# Patient Record
Sex: Male | Born: 1985 | Race: White | Hispanic: No | Marital: Single | Smoking: Current every day smoker
Health system: Southern US, Community
[De-identification: ages and names within clinical notes are randomized; demographics above are authoritative.]

## PROBLEM LIST (undated history)

## (undated) DIAGNOSIS — F419 Anxiety disorder, unspecified: Secondary | ICD-10-CM

## (undated) DIAGNOSIS — B1920 Unspecified viral hepatitis C without hepatic coma: Secondary | ICD-10-CM

## (undated) DIAGNOSIS — J45909 Unspecified asthma, uncomplicated: Secondary | ICD-10-CM

## (undated) DIAGNOSIS — F410 Panic disorder [episodic paroxysmal anxiety] without agoraphobia: Secondary | ICD-10-CM

## (undated) HISTORY — DX: Unspecified viral hepatitis C without hepatic coma: B19.20

---
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
## 2009-02-12 ENCOUNTER — Emergency Department (HOSPITAL_COMMUNITY): Admission: EM | Admit: 2009-02-12 | Discharge: 2009-02-12 | Payer: Self-pay | Admitting: Emergency Medicine

## 2009-02-18 ENCOUNTER — Emergency Department (HOSPITAL_COMMUNITY): Admission: EM | Admit: 2009-02-18 | Discharge: 2009-02-18 | Payer: Self-pay | Admitting: Emergency Medicine

## 2010-05-16 ENCOUNTER — Emergency Department (HOSPITAL_COMMUNITY): Admission: EM | Admit: 2010-05-16 | Discharge: 2010-05-16 | Payer: Self-pay | Admitting: Family Medicine

## 2011-02-21 LAB — COMPREHENSIVE METABOLIC PANEL
BUN: 14 mg/dL (ref 6–23)
CO2: 27 mEq/L (ref 19–32)
Calcium: 9 mg/dL (ref 8.4–10.5)
Chloride: 105 mEq/L (ref 96–112)
Creatinine, Ser: 0.86 mg/dL (ref 0.4–1.5)
GFR calc Af Amer: >60 mL/min (ref >60)
GFR calc non Af Amer: >60 mL/min (ref >60)
Glucose, Bld: 100 mg/dL — ABNORMAL HIGH (ref 70–99)
Total Bilirubin: 0.6 mg/dL (ref 0.3–1.2)

## 2011-02-21 LAB — DIFFERENTIAL
Basophils Absolute: 0 10*3/uL (ref 0.0–0.1)
Eosinophils Relative: 1 % (ref 0–5)
Lymphocytes Relative: 31 % (ref 12–46)
Lymphs Abs: 2.4 10*3/uL (ref 0.7–4.0)
Neutro Abs: 4.6 10*3/uL (ref 1.7–7.7)
Neutrophils Relative %: 59 % (ref 43–77)

## 2011-02-21 LAB — CBC
HCT: 40.2 % (ref 39.0–52.0)
MCHC: 35.3 g/dL (ref 30.0–36.0)
MCV: 94.1 fL (ref 78.0–100.0)
RBC: 4.27 MIL/uL (ref 4.22–5.81)
WBC: 7.8 10*3/uL (ref 4.0–10.5)

## 2011-02-21 LAB — CSF CELL COUNT WITH DIFFERENTIAL: WBC, CSF: 1 /mm3 (ref 0–5)

## 2011-02-21 LAB — CSF CULTURE W GRAM STAIN

## 2011-02-21 LAB — POCT I-STAT, CHEM 8
BUN: 16 mg/dL (ref 6–23)
Calcium, Ion: 1.14 mmol/L (ref 1.12–1.32)
Chloride: 102 mEq/L (ref 96–112)
Creatinine, Ser: 1.3 mg/dL (ref 0.4–1.5)
Glucose, Bld: 143 mg/dL — ABNORMAL HIGH (ref 70–99)
HCT: 46 % (ref 39.0–52.0)
Hemoglobin: 15.6 g/dL (ref 13.0–17.0)
Potassium: 3.2 mEq/L — ABNORMAL LOW (ref 3.5–5.1)
Sodium: 139 mEq/L (ref 135–145)
TCO2: 25 mmol/L (ref 0–100)

## 2011-02-21 LAB — LIPASE, BLOOD: Lipase: 17 U/L (ref 11–59)

## 2011-02-21 LAB — HEMOCCULT GUIAC POC 1CARD (OFFICE): Fecal Occult Bld: POSITIVE

## 2011-02-21 LAB — PROTEIN, CSF: Total  Protein, CSF: 36 mg/dL (ref 15–45)

## 2011-02-21 LAB — GLUCOSE, CSF: Glucose, CSF: 78 mg/dL — ABNORMAL HIGH (ref 43–76)

## 2011-02-21 LAB — POCT CARDIAC MARKERS: Myoglobin, poc: 46.3 ng/mL (ref 12–200)

## 2011-03-30 NOTE — Discharge Summary (Signed)
[place]  Patient:    Jeffrey Holland, Jeffrey Holland                   MRN: [id number] Adm. Date:  [id number] Disc. Date: [id number] Attending:  Asher Muir Dictator:   Dalbert Mayotte, M.D.                           Discharge Summary  DATE OF BIRTH:  [birth year]  DISCHARGE DIAGNOSIS:  Aseptic meningitis.  DISCHARGE MEDICATIONS:  Doxycycline 100 mg p.o. b.i.d. x 3 days.  CONSULTS:  Infectious disease, Rockey Situ. Flavia Shipper., M.D.  PROCEDURES:  Lumbar puncture.  HISTORY OF PRESENT ILLNESS:  The patient is a 25 year old white male admitted with a five-day history of fever, headache, nausea, and vomiting.  He was started on doxycycline after being in the emergency room on May 04, 2001, secondary to a history of tick exposure, but had no fever at that time with a white count of 4.7.  On admission, the patient had continued headache and had some questionable neurologic status changes with some confusion and altered mental status per familys report.  LABORATORY DATA ON ADMISSION:  White count 5.1, hemoglobin 14.4, platelet count 135, ANC 3.1, ALC 1.5.  The LP had an opening pressure of 19, a glucose of 56, a total protein of 151, a white count of 143, red blood cell count 4 with 80% segs, 60% lymphs, and 31% monos.  HSV cultures are pending along with an arbovirus panel and CSF pending as well.  HOSPITAL COURSE: #1 - HEADACHE AND FEVER:  The patient was originally felt to be at risk for bacteria versus viral meningitis and was started on ceftriaxone.  Doxycycline was continued to cover for [facility] spotted fever along with acyclovir to cover for possible HSV encephalitis, although this was felt unlikely secondary to the ______ of rbcs in the CHF.  On the morning after admission, the patient had much improvement in his symptoms, stating that he felt all better and requesting discharge home.  His headache had resolved.  His myalgias had resolved.  His  conversation seemed more appropriate to his family, although he did still have some difficulty in stating the date, although he stated that he did not keep up with those types of things.  Also, I failed to mentioned that urine drug screen was positive for marijuana on admission and he readily admitted to daily use, though his family said this was almost baseline for him.  Secondary to his presentation and persistent fevers, an infectious disease consult was obtained.  Rockey Situ. Flavia Shipper., M.D., came and evaluated the patient and felt that his symptoms were most likely consistent with an aseptic meningitis-type picture.  He recommended sending off the CSF for other specific etiologies, such as enterovirus, West Nile, HSV PCR, and arbovirus studies.  All of these are pending at the time of this dictation.  He was continued to be observed for 48 hours on the ceftriaxone and an MRI was obtained to evaluate for possible encephalitis, but there was no evidence of this on the MRI, which was negative.  After getting these results, infectious disease felt that the patient could discontinue the acyclovir and ceftriaxone, which was done.  The patient was observed overnight and discharged first thing in the morning on May 09, 2001.  He remained stable with no headache, nausea, or vomiting.  He was afebrile and back to his baseline.  DISPOSITION:  The patient was discharged to home.  DISCHARGE INSTRUCTIONS:  The patient was given a regular diet.  FOLLOW-UP:  He was not arranged for follow-up, but was going to follow up with Loran Senters, M.D., in [place], [place], on an as needed basis.  We will follow up on the above results of the cultures mentioned above. DD:  05/09/01 TD:  05/09/01 Job: [id number]

## 2011-03-30 NOTE — Op Note (Signed)
[place]. [facility]  Patient:    Jeffrey, Holland Visit Number: [id number] MRN: [id number]          Service Type: DSU Location: [facility] Attending Physician:  Susa Day Dictated by:   Katy Fitch Naaman Plummer., M.D. Proc. Date: 08/01/01 Admit Date:  08/01/2001                             Operative Report  PREOPERATIVE DIAGNOSIS:  Status post laceration dorsal aspect, right long finger, overlying metacarpophalangeal joint, with extensor lag of right long finger at interphalangeal joint.  POSTOPERATIVE DIAGNOSIS:  Status post laceration dorsal aspect, right long finger, overlying metacarpophalangeal joint, with extensor lag of right long finger at interphalangeal joint, with identification of complete laceration of extensor mechanism through common digital extensor at digital margin of metacarpophalangeal joint and laceration of radial and ulnar sagittal bands.  PROCEDURE: 1. Repair of extensor digitorum longus, right long finger. 2. Repair of radial and ulnar sagittal bands, right long finger.  SURGEON:  Katy Fitch. Sypher, Montez Hageman., M.D.  ASSISTANT:  Jonni Sanger, P.A.  ANESTHESIA:  0.25% Marcaine and 2% lidocaine supplemented by IV sedation supervised by the anesthesiologist, Burna Forts, M.D.  INDICATIONS:  Jeffrey Holland is an 25 year old who was using a knife at home the day prior to surgery, sustaining a deep laceration to the dorsal aspect of his right long finger metacarpophalangeal joint.  He was seen early in the morning of August 01, 2001, at the [facility] day surgery center by Dr. Doug Sou, who noted an extensor tendon laceration.  Dr. Ethelda Chick irrigated the wound and closed the skin and called for a hand surgery consult.  Given the fact that Jeffrey Holland had not been NPO for a period of time, we advised him to wait until approximately 9 a.m. the morning of August 01, 2001, for delayed primary repair of  his tendon at the orthopedic surgical center.  Jeffrey Holland was transferred from the [facility] emergency room to the day surgery facility by the [facility] transportation facility of [facility].  After informed consent, he is brought to the operating room at this time for primary repair of his tendon.  DESCRIPTION OF PROCEDURE:  Jeffrey Holland was brought to the operating room and placed in the supine position on the operating table.  Following light sedation, the right arm was prepped with Betadine soap and solution and sterilely draped.  Following exsanguination of the limb with an Esmarch bandage, the arterial tourniquet was inflated to 220 mmHg.  The procedure commenced with infiltration of 0.25% Marcaine and 2% lidocaine into the MP joint and surrounding tissues, including the digital nerves, to obtain a digital block. When anesthesia was satisfactory, the sutures from Dr. Joesphine Bare repair were removed and the wound thoroughly irrigated with sterile saline.  The joint was flexed and the capsule identified.  This was irrigated thoroughly.  The extensor tendon was then repaired anatomically with a core suture of 4-0 Mersilene using grasping technique, and then repair of the sagittal fiber lacerations with mattress sutures of 4-0 Mersilene with the knots buried.  The skin was then repaired with intradermal 3-0 Prolene and Steri-Strips.  A compressive dressing was applied with a volar plaster splint, maintaining the MP joint in 30 degrees of flexion and the IP joints in full extension. There were no apparent complications.  For aftercare, Jeffrey Holland is given prescriptions for Percocet 5  mg one or two tablets p.o. q.4-6h. p.r.n. pain, 30 tablets without refill; also Keflex 500 mg one p.o. q.8h. x 4 days as a prophylactic antibiotic.  He was also given a gram of Ancef intraoperatively. Dictated by:   Katy Fitch Naaman Plummer., M.D. Attending Physician:  Susa Day DD:  08/01/01 TD:  08/01/01 Job: [id number]

## 2014-06-23 ENCOUNTER — Emergency Department (HOSPITAL_COMMUNITY): Payer: Self-pay

## 2014-06-23 ENCOUNTER — Emergency Department (HOSPITAL_COMMUNITY)
Admission: EM | Admit: 2014-06-23 | Discharge: 2014-06-23 | Disposition: A | Discharge status: Home / Self Care | Payer: Self-pay | Attending: Emergency Medicine | Admitting: Emergency Medicine

## 2014-06-23 ENCOUNTER — Encounter (HOSPITAL_COMMUNITY): Payer: Self-pay | Admitting: Emergency Medicine

## 2014-06-23 DIAGNOSIS — R079 Chest pain, unspecified: Secondary | ICD-10-CM | POA: Insufficient documentation

## 2014-06-23 DIAGNOSIS — Z8719 Personal history of other diseases of the digestive system: Secondary | ICD-10-CM | POA: Insufficient documentation

## 2014-06-23 DIAGNOSIS — R110 Nausea: Secondary | ICD-10-CM | POA: Insufficient documentation

## 2014-06-23 DIAGNOSIS — R1013 Epigastric pain: Secondary | ICD-10-CM | POA: Insufficient documentation

## 2014-06-23 DIAGNOSIS — F17200 Nicotine dependence, unspecified, uncomplicated: Secondary | ICD-10-CM | POA: Insufficient documentation

## 2014-06-23 LAB — HEPATIC FUNCTION PANEL
ALT: 13 U/L (ref 0–53)
AST: 19 U/L (ref 0–37)
Albumin: 4.3 g/dL (ref 3.5–5.2)
Alkaline Phosphatase: 61 U/L (ref 39–117)
Bilirubin, Direct: <0.2 mg/dL (ref 0.0–0.3)
Total Bilirubin: 0.6 mg/dL (ref 0.3–1.2)
Total Protein: 7.7 g/dL (ref 6.0–8.3)

## 2014-06-23 LAB — I-STAT TROPONIN, ED: Troponin i, poc: 0 ng/mL (ref 0.00–0.08)

## 2014-06-23 LAB — BASIC METABOLIC PANEL
Anion gap: 11 (ref 5–15)
BUN: 10 mg/dL (ref 6–23)
CALCIUM: 9.2 mg/dL (ref 8.4–10.5)
CO2: 25 meq/L (ref 19–32)
CREATININE: 0.83 mg/dL (ref 0.50–1.35)
Chloride: 104 mEq/L (ref 96–112)
GFR calc Af Amer: >90 mL/min (ref >90)
GFR calc non Af Amer: >90 mL/min (ref >90)
GLUCOSE: 94 mg/dL (ref 70–99)
Potassium: 4.1 mEq/L (ref 3.7–5.3)
Sodium: 140 mEq/L (ref 137–147)

## 2014-06-23 LAB — CBC
HEMATOCRIT: 43.9 % (ref 39.0–52.0)
HEMOGLOBIN: 15 g/dL (ref 13.0–17.0)
MCH: 33 pg (ref 26.0–34.0)
MCHC: 34.2 g/dL (ref 30.0–36.0)
MCV: 96.5 fL (ref 78.0–100.0)
Platelets: 250 10*3/uL (ref 150–400)
RBC: 4.55 MIL/uL (ref 4.22–5.81)
RDW: 12.9 % (ref 11.5–15.5)
WBC: 8.5 10*3/uL (ref 4.0–10.5)

## 2014-06-23 LAB — LIPASE, BLOOD: Lipase: 37 U/L (ref 11–59)

## 2014-06-23 MED ORDER — FAMOTIDINE 20 MG PO TABS: 20.0000 mg | ORAL_TABLET | Freq: Two times a day (BID) | ORAL | Status: DC

## 2014-06-23 MED ORDER — GI COCKTAIL ~~LOC~~
30.0000 mL | Freq: Once | ORAL | Status: AC
  Administered 2014-06-23: 30 mL via ORAL
  Filled 2014-06-23: qty 30

## 2014-06-23 NOTE — ED Provider Notes (Signed)
CSN: [id number]     Arrival date & time 06/23/14  1300 History   First MD Initiated Contact with Patient 06/23/14 1604     Chief Complaint  Patient presents with  . Chest Pain     (Consider location/radiation/quality/duration/timing/severity/associated sxs/prior Treatment) HPI Pt is a 28yo male presenting to ED with c/o gradually worsening intermittent chest pain x2 weeks.  Chest pain is centralized and also present in epigastric region of abdomen.  Pain is an aching and sharp sensation, 10/10 at worst.  Nothing makes better or worse.  Pt states he has had similar pain x10 years but states he never got it checked out.  Father is present with pt and states he thinks pt has a hiatal hernia as pt has similar symptoms as he did and states "it took them a while to figure out what was causing my pain, they almost did a heart cath before they found it."  Pt has been taking zantac w/o relief.  Pt has also tried father prescription medication w/o relief. Reports nausea but denies vomiting. Denies cough, congestion, SOB, fever.  States pain comes and goes, nothing makes better or worse. No hx of CAD or abdominal surgeries. Pt is a daily smoker, 2ppd.      History reviewed. No pertinent past medical history. History reviewed. No pertinent past surgical history. No family history on file. History  Substance Use Topics  . Smoking status: Current Every Day Smoker -- 2.00 packs/day    Types: Cigarettes  . Smokeless tobacco: Not on file  . Alcohol Use: No    Review of Systems  Constitutional: Negative for fever, chills, diaphoresis and appetite change.  HENT: Negative for congestion and sore throat.   Respiratory: Negative for cough, chest tightness and shortness of breath.   Cardiovascular: Positive for chest pain. Negative for palpitations and leg swelling.  Gastrointestinal: Positive for nausea and abdominal pain. Negative for vomiting, diarrhea and constipation.  All other systems reviewed and are  negative.     Allergies  Review of patient's allergies indicates no known allergies.  Home Medications   Prior to Admission medications   Medication Sig Start Date End Date Taking? Authorizing Provider  famotidine (PEPCID) 20 MG tablet Take 1 tablet (20 mg total) by mouth 2 (two) times daily. 06/23/14   Junius Finner, PA-C   BP 102/67  Pulse 68  Temp(Src) 98.2 F (36.8 C) (Oral)  Resp 17  SpO2 99% Physical Exam  Nursing note and vitals reviewed. Constitutional: He appears well-developed and well-nourished.  Pt lying comfortably in exam bed, NAD.   HENT:  Head: Normocephalic and atraumatic.  Eyes: Conjunctivae are normal. No scleral icterus.  Neck: Normal range of motion.  Cardiovascular: Normal rate, regular rhythm and normal heart sounds.   Regular rate and rhythm  Pulmonary/Chest: Effort normal and breath sounds normal. No respiratory distress. He has no wheezes. He has no rales. He exhibits no tenderness.  No respiratory distress, able to speak in full sentences w/o difficulty. Lungs: CTAB. No chest wall tenderness.  Abdominal: Soft. Bowel sounds are normal. He exhibits no distension and no mass. There is tenderness. There is no rebound and no guarding.  Soft, non-distended. Normal bowel sounds. Tenderness in upper abdomen, worse in epigastrium. No rebound, guarding or masses.  Musculoskeletal: Normal range of motion.  Neurological: He is alert.  Skin: Skin is warm and dry.    ED Course  Procedures (including critical care time) Labs Review Labs Reviewed  BASIC METABOLIC PANEL  CBC  HEPATIC FUNCTION PANEL  LIPASE, BLOOD  I-STAT TROPOININ, ED    Imaging Review Dg Chest 2 View  06/23/2014   CLINICAL DATA:  Mid chest and abdominal pain for 3 weeks with similar pain intermittently for 1 year. Shortness of breath today  EXAM: CHEST  2 VIEW  COMPARISON:  PA and lateral chest of February 18, 2009  FINDINGS: The lungs are mildly hyperinflated with hemidiaphragm flattening.  The heart and pulmonary vascularity are normal. The mediastinum is normal in width. There is no pleural effusion or pneumothorax. The bony thorax is unremarkable.  IMPRESSION: Hyperinflation consistent with COPD or reactive airway disease. There is no pneumonia nor CHF nor other acute cardiopulmonary abnormality.   Electronically Signed   By: David  SwazilandJordan   On: 06/23/2014 13:37     EKG Interpretation None      MDM   Final diagnoses:  Epigastric pain  Chest pain, unspecified chest pain type    Pt is a 28yo male c/o epigastric pain radiating into chest x2 weeks but reports hx of similar symptoms x10 years. Denies cardiac hx. Chief complaint states "chest pain" but pt keeps indicating epigastric pain. Pt also tender in RUQ.  Pt appears well, non-toxic, afebrile. Labs: unremarkable. Pain is atypical for ACS. PERC negative. GI cocktail given w/o relief. Abd U/S of gallbladder: unremarkable. Discussed findings with pt, advised to f/u with GI for further evaluation. Pt may have gastric and/or duodenal ulcer(s) may benefit from endoscopy for chronic symptoms. Ad[facility]. Discussed pt with Dr. Romeo AppleHarrison who also examined pt, agrees pt may be discharged home. Return precautions provided. Pt verbalized understanding and agreement with tx plan.     Junius Finnerrin O'Malley, PA-C 06/23/14 1904

## 2014-06-23 NOTE — ED Notes (Signed)
Lab called to add on blood work 

## 2014-06-23 NOTE — ED Notes (Signed)
Patient states he has been having intermittent chest pain x 2 weeks.  Patient states that the pain is central chest with radiation to abdominal area.   Patient states N/V for 2 days, but denies other symptoms.   Patient states "i have really had chest pain x 10 years, but never got it checked out".

## 2014-06-23 NOTE — ED Notes (Signed)
Patient transported to Ultrasound 

## 2014-06-23 NOTE — Progress Notes (Signed)
[facility] Community Liaison Stacy, ° °Will be sending information on [facility] orange Card program, using the address provided.  °

## 2014-06-24 NOTE — ED Provider Notes (Signed)
Medical screening examination/treatment/procedure(s) were conducted as a shared visit with non-physician practitioner(s) and myself.  I personally evaluated the patient during the encounter.   EKG Interpretation None      I interviewed and examined the patient. Lungs are CTAB. Cardiac exam wnl. Abdomen soft w/ mild ttp of epig area. Appears to be epig pain rather than cp. Workup non-contrib. Plan for d/c home.   Purvis SheffieldForrest Zarius Furr, MD 06/24/14 [phone]

## 2015-12-24 ENCOUNTER — Encounter (HOSPITAL_COMMUNITY): Payer: Self-pay | Admitting: *Deleted

## 2015-12-24 ENCOUNTER — Emergency Department (HOSPITAL_COMMUNITY)
Admission: EM | Admit: 2015-12-24 | Discharge: 2015-12-24 | Disposition: A | Discharge status: Home / Self Care | Payer: Self-pay | Attending: Emergency Medicine | Admitting: Emergency Medicine

## 2015-12-24 ENCOUNTER — Emergency Department (HOSPITAL_COMMUNITY): Payer: Self-pay

## 2015-12-24 DIAGNOSIS — F17200 Nicotine dependence, unspecified, uncomplicated: Secondary | ICD-10-CM | POA: Insufficient documentation

## 2015-12-24 DIAGNOSIS — R200 Anesthesia of skin: Secondary | ICD-10-CM | POA: Insufficient documentation

## 2015-12-24 DIAGNOSIS — Z7289 Other problems related to lifestyle: Secondary | ICD-10-CM

## 2015-12-24 DIAGNOSIS — F1210 Cannabis abuse, uncomplicated: Secondary | ICD-10-CM | POA: Insufficient documentation

## 2015-12-24 DIAGNOSIS — F1099 Alcohol use, unspecified with unspecified alcohol-induced disorder: Secondary | ICD-10-CM | POA: Insufficient documentation

## 2015-12-24 DIAGNOSIS — R0602 Shortness of breath: Secondary | ICD-10-CM | POA: Insufficient documentation

## 2015-12-24 DIAGNOSIS — R251 Tremor, unspecified: Secondary | ICD-10-CM | POA: Insufficient documentation

## 2015-12-24 DIAGNOSIS — Z789 Other specified health status: Secondary | ICD-10-CM

## 2015-12-24 LAB — CBC WITH DIFFERENTIAL/PLATELET
BASOS PCT: 0 %
Basophils Absolute: 0 10*3/uL (ref 0.0–0.1)
EOS ABS: 0.3 10*3/uL (ref 0.0–0.7)
EOS PCT: 2 %
HCT: 45.6 % (ref 39.0–52.0)
HEMOGLOBIN: 15.1 g/dL (ref 13.0–17.0)
Lymphocytes Relative: 42 %
Lymphs Abs: 5.4 10*3/uL — ABNORMAL HIGH (ref 0.7–4.0)
MCH: 33 pg (ref 26.0–34.0)
MCHC: 33.1 g/dL (ref 30.0–36.0)
MCV: 99.6 fL (ref 78.0–100.0)
MONOS PCT: 6 %
Monocytes Absolute: 0.8 10*3/uL (ref 0.1–1.0)
NEUTROS PCT: 50 %
Neutro Abs: 6.2 10*3/uL (ref 1.7–7.7)
PLATELETS: 275 10*3/uL (ref 150–400)
RBC: 4.58 MIL/uL (ref 4.22–5.81)
RDW: 13.9 % (ref 11.5–15.5)
WBC: 12.8 10*3/uL — AB (ref 4.0–10.5)

## 2015-12-24 LAB — COMPREHENSIVE METABOLIC PANEL
ALT: 19 U/L (ref 17–63)
AST: 21 U/L (ref 15–41)
Albumin: 4.6 g/dL (ref 3.5–5.0)
Alkaline Phosphatase: 60 U/L (ref 38–126)
Anion gap: 10 (ref 5–15)
BUN: 10 mg/dL (ref 6–20)
CO2: 25 mmol/L (ref 22–32)
Calcium: 8.7 mg/dL — ABNORMAL LOW (ref 8.9–10.3)
Chloride: 106 mmol/L (ref 101–111)
Creatinine, Ser: 0.93 mg/dL (ref 0.61–1.24)
GFR calc Af Amer: >60 mL/min (ref >60)
GFR calc non Af Amer: >60 mL/min (ref >60)
Glucose, Bld: 100 mg/dL — ABNORMAL HIGH (ref 65–99)
Potassium: 3.5 mmol/L (ref 3.5–5.1)
Sodium: 141 mmol/L (ref 135–145)
Total Bilirubin: 0.5 mg/dL (ref 0.3–1.2)
Total Protein: 7.6 g/dL (ref 6.5–8.1)

## 2015-12-24 LAB — RAPID URINE DRUG SCREEN, HOSP PERFORMED
Amphetamines: NOT DETECTED
Barbiturates: NOT DETECTED
Benzodiazepines: NOT DETECTED
COCAINE: NOT DETECTED
OPIATES: NOT DETECTED
TETRAHYDROCANNABINOL: POSITIVE — AB

## 2015-12-24 LAB — ETHANOL: Alcohol, Ethyl (B): 118 mg/dL — ABNORMAL HIGH (ref <5)

## 2015-12-24 LAB — TROPONIN I: Troponin I: <0.03 ng/mL (ref <0.031)

## 2015-12-24 MED ORDER — LORAZEPAM 1 MG PO TABS: 1.0000 mg | ORAL_TABLET | Freq: Once | ORAL | Status: DC

## 2015-12-24 NOTE — ED Provider Notes (Signed)
CSN: [id number]     Arrival date & time 12/24/15  0157 History  By signing my name below, I, Jeffrey Holland, attest that this documentation has been prepared under the direction and in the presence of Rolan Bucco, MD. Electronically Signed: Lyndel Holland, ED Scribe. 12/24/2015. 3:58 AM.    Chief Complaint  Patient presents with  . Chest Pain  . Anxiety   The history is provided by the patient. No language interpreter was used.   HPI Comments: Jeffrey Holland is a 30 y.o. male who presents to the Emergency Department complaining of constant, right-sided chest pain onset with waking up ~ 24 hours ago that radiates down bilateral arms. Pt associates numbness in bilateral hands and the feeling of being off-balance. He notes no pattern to the chest pain but states the pain is exacerbated with abduction of bilateral arms. The pt describes an episode this evening where his left arm and left leg were shaking uncontrollably at which point he consumed 2 beers in an attempt 'to calm his nerves'. He admits to consuming 2-3 beers daily and use of marijuana intermittently. Pt denies nausea, vomiting, or BLE edema or pain, headaches, dizziness or weakness. No h/o of seizure disorder.    History reviewed. No pertinent past medical history. History reviewed. No pertinent past surgical history. History reviewed. No pertinent family history. Social History  Substance Use Topics  . Smoking status: Current Every Day Smoker -- 1.00 packs/day  . Smokeless tobacco: None  . Alcohol Use: Yes    Review of Systems  Constitutional: Negative for fever, chills, diaphoresis and fatigue.  HENT: Negative for congestion, rhinorrhea and sneezing.   Eyes: Negative.   Respiratory: Positive for shortness of breath. Negative for cough and chest tightness.   Cardiovascular: Positive for chest pain. Negative for leg swelling.  Gastrointestinal: Negative for nausea, vomiting, abdominal pain, diarrhea and blood in  stool.  Genitourinary: Negative for frequency, hematuria, flank pain and difficulty urinating.  Musculoskeletal: Positive for arthralgias ( BUE). Negative for back pain.  Skin: Negative for rash.  Neurological: Positive for tremors and numbness ( bilateral hands). Negative for dizziness, speech difficulty, weakness and headaches.   Allergies  Review of patient's allergies indicates no known allergies.  Home Medications   Prior to Admission medications   Medication Sig Start Date End Date Taking? Authorizing Provider  acetaminophen (TYLENOL) 500 MG tablet Take 1,000 mg by mouth every 6 (six) hours as needed for mild pain.   Yes Historical Provider, MD   BP 126/73 mmHg  Pulse 100  Temp(Src) 97 F (36.1 C) (Oral)  Resp 12  Ht 5\' 8"  (1.727 m)  Wt 150 lb (68.04 kg)  BMI 22.81 kg/m2  SpO2 96% Physical Exam  Constitutional: He is oriented to person, place, and time. He appears well-developed and well-nourished.  HENT:  Head: Normocephalic and atraumatic.  Eyes: Pupils are equal, round, and reactive to light.  Neck: Normal range of motion. Neck supple.  Cardiovascular: Normal rate, regular rhythm and normal heart sounds.   Pulmonary/Chest: Effort normal and breath sounds normal. No respiratory distress. He has no wheezes. He has no rales. He exhibits no tenderness.  Abdominal: Soft. Bowel sounds are normal. There is no tenderness. There is no rebound and no guarding.  Musculoskeletal: Normal range of motion. He exhibits no edema.  Lymphadenopathy:    He has no cervical adenopathy.  Neurological: He is alert and oriented to person, place, and time.  Patient at times has a resting tremor to  both upper extremities although it's more prominent on the left. The tremor is not constant. He has normal grip strength and motor function in all extremities. Cranial nerves intact. Sensation grossly intact to light touch all extremity. Gait normal.  Skin: Skin is warm and dry. No rash noted.   Psychiatric: He has a normal mood and affect.    ED Course  Procedures  DIAGNOSTIC STUDIES: Oxygen Saturation is 96% on RA, adequate by my interpretation.    COORDINATION OF CARE: 3:56 AM Discussed treatment plan which includes a head CT, chest Xray and diagnostic labs. Will also order Ativan. Pt agreeable to plan.   Labs Review Results for orders placed or performed during the hospital encounter of 12/24/15  Comprehensive metabolic panel  Result Value Ref Range   Sodium 141 135 - 145 mmol/L   Potassium 3.5 3.5 - 5.1 mmol/L   Chloride 106 101 - 111 mmol/L   CO2 25 22 - 32 mmol/L   Glucose, Bld 100 (H) 65 - 99 mg/dL   BUN 10 6 - 20 mg/dL   Creatinine, Ser 1.61 0.61 - 1.24 mg/dL   Calcium 8.7 (L) 8.9 - 10.3 mg/dL   Total Protein 7.6 6.5 - 8.1 g/dL   Albumin 4.6 3.5 - 5.0 g/dL   AST 21 15 - 41 U/L   ALT 19 17 - 63 U/L   Alkaline Phosphatase 60 38 - 126 U/L   Total Bilirubin 0.5 0.3 - 1.2 mg/dL   GFR calc non Af Amer >60 >60 mL/min   GFR calc Af Amer >60 >60 mL/min   Anion gap 10 5 - 15  CBC with Differential  Result Value Ref Range   WBC 12.8 (H) 4.0 - 10.5 K/uL   RBC 4.58 4.22 - 5.81 MIL/uL   Hemoglobin 15.1 13.0 - 17.0 g/dL   HCT 09.6 04.5 - 40.9 %   MCV 99.6 78.0 - 100.0 fL   MCH 33.0 26.0 - 34.0 pg   MCHC 33.1 30.0 - 36.0 g/dL   RDW 81.1 91.4 - 78.2 %   Platelets 275 150 - 400 K/uL   Neutrophils Relative % 50 %   Neutro Abs 6.2 1.7 - 7.7 K/uL   Lymphocytes Relative 42 %   Lymphs Abs 5.4 (H) 0.7 - 4.0 K/uL   Monocytes Relative 6 %   Monocytes Absolute 0.8 0.1 - 1.0 K/uL   Eosinophils Relative 2 %   Eosinophils Absolute 0.3 0.0 - 0.7 K/uL   Basophils Relative 0 %   Basophils Absolute 0.0 0.0 - 0.1 K/uL  Urine rapid drug screen (hosp performed)  Result Value Ref Range   Opiates NONE DETECTED NONE DETECTED   Cocaine NONE DETECTED NONE DETECTED   Benzodiazepines NONE DETECTED NONE DETECTED   Amphetamines NONE DETECTED NONE DETECTED   Tetrahydrocannabinol  POSITIVE (A) NONE DETECTED   Barbiturates NONE DETECTED NONE DETECTED  Ethanol  Result Value Ref Range   Alcohol, Ethyl (B) 118 (H) <5 mg/dL  Troponin I  Result Value Ref Range   Troponin I <0.03 <0.031 ng/mL   Dg Chest 2 View  12/24/2015  CLINICAL DATA:  Acute onset of mid chest pain and shortness of breath. Initial encounter. EXAM: CHEST  2 VIEW COMPARISON:  None. FINDINGS: The lungs are well-aerated and clear. There is no evidence of focal opacification, pleural effusion or pneumothorax. The heart is normal in size; the mediastinal contour is within normal limits. No acute osseous abnormalities are seen. IMPRESSION: No acute cardiopulmonary process seen. Electronically  Signed   By: Roanna Raider M.D.   On: 12/24/2015 04:52   Ct Head Wo Contrast  12/24/2015  CLINICAL DATA:  Initial evaluation for tremors, imbalance. EXAM: CT HEAD WITHOUT CONTRAST TECHNIQUE: Contiguous axial images were obtained from the base of the skull through the vertex without intravenous contrast. COMPARISON:  None. FINDINGS: There is no acute intracranial hemorrhage or infarct. No mass lesion or midline shift. Gray-white matter differentiation is well maintained. Ventricles are normal in size without evidence of hydrocephalus. CSF containing spaces are within normal limits. No extra-axial fluid collection. The calvarium is intact. Orbital soft tissues are within normal limits. Scattered mucosal thickening within the ethmoidal air cells. Visualized paranasal sinuses are otherwise clear. No mastoid effusion. Scalp soft tissues are unremarkable. IMPRESSION: Normal head CT with no acute intracranial process identified. Electronically Signed   By: Rise Mu M.D.   On: 12/24/2015 04:50      Imaging Review Dg Chest 2 View  12/24/2015  CLINICAL DATA:  Acute onset of mid chest pain and shortness of breath. Initial encounter. EXAM: CHEST  2 VIEW COMPARISON:  None. FINDINGS: The lungs are well-aerated and clear. There is  no evidence of focal opacification, pleural effusion or pneumothorax. The heart is normal in size; the mediastinal contour is within normal limits. No acute osseous abnormalities are seen. IMPRESSION: No acute cardiopulmonary process seen. Electronically Signed   By: Roanna Raider M.D.   On: 12/24/2015 04:52   Ct Head Wo Contrast  12/24/2015  CLINICAL DATA:  Initial evaluation for tremors, imbalance. EXAM: CT HEAD WITHOUT CONTRAST TECHNIQUE: Contiguous axial images were obtained from the base of the skull through the vertex without intravenous contrast. COMPARISON:  None. FINDINGS: There is no acute intracranial hemorrhage or infarct. No mass lesion or midline shift. Gray-white matter differentiation is well maintained. Ventricles are normal in size without evidence of hydrocephalus. CSF containing spaces are within normal limits. No extra-axial fluid collection. The calvarium is intact. Orbital soft tissues are within normal limits. Scattered mucosal thickening within the ethmoidal air cells. Visualized paranasal sinuses are otherwise clear. No mastoid effusion. Scalp soft tissues are unremarkable. IMPRESSION: Normal head CT with no acute intracranial process identified. Electronically Signed   By: Rise Mu M.D.   On: 12/24/2015 04:50   I have personally reviewed and evaluated these images and lab results as part of my medical decision-making.   EKG Interpretation   Date/Time:  Saturday December 24 2015 02:15:07 EST Ventricular Rate:  103 PR Interval:  149 QRS Duration: 81 QT Interval:  336 QTC Calculation: 440 R Axis:   85 Text Interpretation:  Sinus tachycardia No old tracing to compare  Confirmed by Katelynne Revak  MD, Oasis Goehring ([id number]) on 12/24/2015 2:24:43 AM      MDM   Final diagnoses:  Occasional tremors  Alcohol use (HCC)    Patient presents with anxiety in association with chest pain and a tremor. His chest pain is completely resolved. I don't see any evidence to suggest that  this was acute coronary syndrome. His EKG was without ischemia. His troponin is negative. His chest x-ray is clear without evidence of pneumonia or pneumothorax. He did on arrival have an occasional tremor where his left arm which ache although his right arm and also shake. He did seem to be able to control it somewhat. He doesn't have any other neurologic symptoms or deficits. He has no ataxia. No speech deficits or vision changes. Over the course of the ED, his tremor is completely  resolved. I initially was given a dose Ativan but he actually did not need it and she had no further tremor. CT scan shows no evidence of intracranial hemorrhage or mass. I feel this could be related to his alcohol use or possibly anxiety. He currently doesn't have any signs of which were all. His heart rate is normalized. He was discharged home in good condition. He was given outpatient resources for follow-up with behavioral health. He does seem to have a fair amount of anxiety and strong family history of bipolar disorder. He currently denies any depression or suicidal ideations. I did counsel him on his alcohol use and advised him to stop drinking alcohol. He was given resources for substance abuse treatment. He was also given a referral to neurology should his symptoms continue. He was encouraged to return here if he has any worsening symptoms.  I personally performed the services described in this documentation, which was scribed in my presence.  The recorded information has been reviewed and considered.    Rolan Bucco, MD 12/24/15 [id number]

## 2015-12-24 NOTE — Discharge Instructions (Signed)
Substance Abuse Treatment Programs ° °Intensive Outpatient Programs °[facility]     °[address]      °[place], [place]                   °[phone]      ° °[facility] °[address] °[place], [place] °[phone] ° °[facility]     °(Inpatient and outpatient)     °[address]           °[phone]   ° °[facility] °[phone] (Suboxone and Methadone) ° °[address]      °[place], [place] [address]      °[phone]      ° °[address] °[place], [place] °852-3033 ° °[facility] (Outpatient/Inpatient, Chemical)    °(insurance only) [phone]      °       °[facility] (Groups & Residential) °[place], [place] °[phone] ° °   °[facility]     °[address]     °[place], [place]      °[phone]      ° °[facility] (for caregivers and family) °[address] °[place], [place] °336-299-4655 ° ° ° ° ° °Residential Treatment Programs °[facility]      °[address], [place], [place] [address]  °[phone]      ° °[facility] °[address] [place], [place] [address] °[phone] ° °[facility]        °[phone]      ° °[facility] °[phone] ° °[facility]             °[address]                                         °[place], [place]                                                °[phone] or [phone]                              ° °[facility] [place] °[address] °[place], [address] °[phone] ° °[facility]    °[address]      °[place], [place]     °[phone]      ° °[facility] °[address] °[place], [place] °[phone] ° °[facility]   °[address]     °[place], [place] [address]     °[phone]      °Admissions: 8am-3pm M-F ° °[facility] °[address] °[place],  [place] °[phone] ° °[facility]: Residential Program (90 Days)   °[place], [place]      °336-725-8389 or [phone]    ° °[facility] °[place], [place] °(Walk in Hours over the weekend or by referral) ° °[facility] °[address], [place],  [address] °([phone] ° °Crisis Mobile: [facility]:  1-877-626-1772 (for crisis response 24 hours a day) °[facility] Hotline:      1-800-256-2452 °Outpatient Psychiatry and Counseling ° °[facility]: Mobile Crisis   Management 24 hours:  1-877-626-1772 ° °[facility] sliding scale fee and walk in schedule: M-F 8am-12pm/1pm-3pm °[address]  °[place], [place] [address] °[phone] ° °[facility] °[address] °[place], [place] [address] °[phone] ° °[facility] ([facility] known [facility])- new patient walk-in appointments available Monday - Friday 8am -3pm.          °[address] °[place], [place] [address] °336-676-6840 or crisis line- 336-676-6905 ° °[facility] Intensive Outpatient Therapy Program °[address] °[place], [place] [address] °336-832-9804 ° °[facility]                  °Crisis Services      °[phone]      °[address]     °[place], [place] [address]                ° °[facility]   °[facility] °[phone] °[address] °[place], [place] [address] ° ° °[facility]          °[address]  °[place], [place] [address]       °[phone] ° °[facility] °[address] °[place], [place] [address] °336-292-1510 ° °[facility]    °[address]    °[place], [place] [address]     °[phone]      ° °Parish McKinney, MD     °[address]     °[place] [address]     °[phone]     °  °[facility] °[address] [place] °[place] [address] ° °[facility]     °[address]     °[place], [place]      °336-542-2076      ° °[facility] °Shamsher Ahluwalia, MD °[address] °[place], [place] [address] °[phone] ° °[facility]     °[address]     °[place] , [place] [address]     °336-274-1237      ° °[facility] °[address] °[place], [place] [address] °336-854-4450 °Resources for Temporary Residential Assistance/Crisis Centers ° °DAY CENTERS °[facility] °M-F 8am-3pm   °[address] [place], [place] [address]   336-332-0824 °Services include: laundry, barbering, support groups, case management, phone  & computer access, showers, AA/NA mtgs, mental health/substance abuse nurse, job skills class, disability information, VA assistance, spiritual classes, etc.  ° °HOMELESS SHELTERS ° °[facility]     °[facility]   °[address], [place]     °[phone]       °       °[facility] (women and children)       °[address] °[place], [place] [address] °336-275-0820 °[email or web address] for application and process °Application Required ° °[facility]   °[address]    °[place]  [address]     °[phone]       °             °[facility] °[address] °,  [address] °336.273.5572 °336-235-0363(schedule application appt.) °Application Required ° °[facility] (women only)    °[address]     °[place],  [address]     °336-884-1039      °  Intake starts 6pm daily °Need valid ID, SSC, & Police report °[facility] [place] °[address] °[place], [place] °336-881-5420 °Application Required ° °[facility] (men only)     °[address]      °[place], [place]     °[phone]      ° °[facility] °(Pregnant women only) °[address] °[place], [place] °336-275-0206 ° °[facility]      °[address]      °[place], [place] [address]     °[phone]      °       °[facility] °[address] °[place], [place] °336-723-1848 °90 day commitment/SA/Application process ° °[facility](men only)     °[address]     °[place], [place]     °[phone]       °Check-in at 7pm     °       °[facility] °[address] °[place], [place] [address] °336-248-6684 °Men/Women/Women and Children must be there by 7 pm ° °[facility] °[place], [place] °336-722-8721                ° °

## 2015-12-24 NOTE — ED Notes (Signed)
Pt had been having chest pain and then he started shaking really bad and chest pain got worse,    Pt thought he would drink 3 12 oz beers to help but it didn't

## 2015-12-29 ENCOUNTER — Encounter (HOSPITAL_COMMUNITY): Payer: Self-pay | Admitting: Emergency Medicine

## 2016-01-05 ENCOUNTER — Emergency Department (HOSPITAL_COMMUNITY): Payer: Commercial Managed Care - HMO

## 2016-01-05 ENCOUNTER — Encounter (HOSPITAL_COMMUNITY): Payer: Self-pay | Admitting: *Deleted

## 2016-01-05 ENCOUNTER — Emergency Department (HOSPITAL_COMMUNITY)
Admission: EM | Admit: 2016-01-05 | Discharge: 2016-01-06 | Disposition: A | Discharge status: Home / Self Care | Payer: Commercial Managed Care - HMO | Attending: Emergency Medicine | Admitting: Emergency Medicine

## 2016-01-05 DIAGNOSIS — F419 Anxiety disorder, unspecified: Secondary | ICD-10-CM | POA: Diagnosis not present

## 2016-01-05 DIAGNOSIS — F17210 Nicotine dependence, cigarettes, uncomplicated: Secondary | ICD-10-CM | POA: Diagnosis not present

## 2016-01-05 DIAGNOSIS — M545 Low back pain: Secondary | ICD-10-CM | POA: Insufficient documentation

## 2016-01-05 DIAGNOSIS — R630 Anorexia: Secondary | ICD-10-CM | POA: Insufficient documentation

## 2016-01-05 DIAGNOSIS — R1013 Epigastric pain: Secondary | ICD-10-CM | POA: Diagnosis present

## 2016-01-05 DIAGNOSIS — R251 Tremor, unspecified: Secondary | ICD-10-CM | POA: Diagnosis not present

## 2016-01-05 DIAGNOSIS — K2970 Gastritis, unspecified, without bleeding: Secondary | ICD-10-CM | POA: Diagnosis not present

## 2016-01-05 HISTORY — DX: Panic disorder (episodic paroxysmal anxiety): F41.0

## 2016-01-05 HISTORY — DX: Anxiety disorder, unspecified: F41.9

## 2016-01-05 LAB — CBC
HEMATOCRIT: 46.4 % (ref 39.0–52.0)
HEMOGLOBIN: 15.6 g/dL (ref 13.0–17.0)
MCH: 32.8 pg (ref 26.0–34.0)
MCHC: 33.6 g/dL (ref 30.0–36.0)
MCV: 97.5 fL (ref 78.0–100.0)
Platelets: 250 10*3/uL (ref 150–400)
RBC: 4.76 MIL/uL (ref 4.22–5.81)
RDW: 13.4 % (ref 11.5–15.5)
WBC: 9.1 10*3/uL (ref 4.0–10.5)

## 2016-01-05 LAB — TROPONIN I

## 2016-01-05 LAB — BASIC METABOLIC PANEL
ANION GAP: 11 (ref 5–15)
BUN: 13 mg/dL (ref 6–20)
CALCIUM: 9.5 mg/dL (ref 8.9–10.3)
CO2: 26 mmol/L (ref 22–32)
Chloride: 101 mmol/L (ref 101–111)
Creatinine, Ser: 1.09 mg/dL (ref 0.61–1.24)
GLUCOSE: 144 mg/dL — AB (ref 65–99)
POTASSIUM: 4 mmol/L (ref 3.5–5.1)
Sodium: 138 mmol/L (ref 135–145)

## 2016-01-05 MED ORDER — GI COCKTAIL ~~LOC~~
30.0000 mL | Freq: Once | ORAL | Status: AC
  Administered 2016-01-06: 30 mL via ORAL
  Filled 2016-01-05: qty 30

## 2016-01-05 MED ORDER — PANTOPRAZOLE SODIUM 40 MG PO TBEC
40.0000 mg | DELAYED_RELEASE_TABLET | Freq: Once | ORAL | Status: AC
  Administered 2016-01-06: 40 mg via ORAL
  Filled 2016-01-05: qty 1

## 2016-01-05 NOTE — ED Notes (Signed)
The pt is c/o pain in his mid-chest all day  He has a history of the same  With  Numbness and tingling in both his arms and hands.  He was aware that he was hyperventilating and he has a history of anxiety and panic attacks

## 2016-01-05 NOTE — ED Provider Notes (Signed)
CSN: [id number]     Arrival date & time 01/05/16  1924 History  By signing my name below, I, Freida Busman, attest that this documentation has been prepared under the direction and in the presence of Loren Racer, MD . Electronically Signed: Freida Busman, Scribe. 01/05/2016. 11:56 PM.    Chief Complaint  Patient presents with  . Chest Pain  . Panic Attack   The history is provided by medical records and the patient. No language interpreter was used.     HPI Comments:  Jeffrey Holland is a 30 y.o. male who presents to the Emergency Department complaining of Multiple episodes of stabbing epigastric pain x 3 days. He notes this current episode began ~ 1000 this AM while at work doing heavy lifting; states pain has been constant and radiates around to back. Pt reports associated appetite change, nausea and vomiting, ~ 4 episodes and at least one with streaks of blood. He denies excessive use of ibuprofen/goody powder, or recent change in stool color or frank blood. No alleviating factors noted. No shortness of breath  Past Medical History  Diagnosis Date  . Anxiety   . Panic    History reviewed. No pertinent past surgical history. No family history on file. Social History  Substance Use Topics  . Smoking status: Current Every Day Smoker -- 1.00 packs/day    Types: Cigarettes  . Smokeless tobacco: None  . Alcohol Use: Yes    Review of Systems  Constitutional: Positive for appetite change. Negative for fever and chills.  Respiratory: Negative for shortness of breath.   Cardiovascular: Negative for chest pain and leg swelling.  Gastrointestinal: Positive for nausea, vomiting and abdominal pain. Negative for diarrhea, constipation and blood in stool.  Musculoskeletal: Positive for back pain.  Skin: Negative for rash and wound.  Neurological: Positive for tremors. Negative for dizziness, weakness and numbness.  Psychiatric/Behavioral: The patient is nervous/anxious.   All other  systems reviewed and are negative.   Allergies  Review of patient's allergies indicates no known allergies.  Home Medications   Prior to Admission medications   Medication Sig Start Date End Date Taking? Authorizing Provider  famotidine (PEPCID) 20 MG tablet Take 1 tablet (20 mg total) by mouth 2 (two) times daily. Patient not taking: Reported on 01/06/2016 06/23/14   Junius Finner, PA-C  lansoprazole (PREVACID) 30 MG capsule Take 1 capsule (30 mg total) by mouth daily at 12 noon. 01/06/16   Loren Racer, MD  ondansetron (ZOFRAN) 4 MG tablet Take 1 tablet (4 mg total) by mouth every 8 (eight) hours as needed for nausea or vomiting. 01/06/16   Loren Racer, MD  sucralfate (CARAFATE) 1 g tablet Take 1 tablet (1 g total) by mouth 3 (three) times daily as needed (epigastric pain). 01/06/16   Loren Racer, MD   BP 118/78 mmHg  Pulse 84  Temp(Src) 98.4 F (36.9 C) (Oral)  Resp 16  Ht  (1.676 m)  Wt 155 lb (70.308 kg)  BMI 25.03 kg/m2  SpO2 99% Physical Exam  Constitutional: He is oriented to person, place, and time. He appears well-developed and well-nourished. No distress.  HENT:  Head: Normocephalic and atraumatic.  Mouth/Throat: Oropharynx is clear and moist.  Eyes: EOM are normal. Pupils are equal, round, and reactive to light.  Neck: Normal range of motion. Neck supple.  Cardiovascular: Normal rate and regular rhythm.   Pulmonary/Chest: Effort normal and breath sounds normal. No respiratory distress. He has no wheezes. He has no rales.  Abdominal: Soft. Bowel sounds are normal. He exhibits no distension and no mass. There is tenderness (tenderness to palpation in the epigastric region.). There is no rebound and no guarding.  Musculoskeletal: Normal range of motion. He exhibits no edema or tenderness.  No CVA tenderness. No midline thoracic or lumbar tenderness. No lower extremity swelling or asymmetry. Distal pulses equal and intact.  Neurological: He is alert and  oriented to person, place, and time.  Patient is alert and oriented x3 with clear, goal oriented speech. Patient has 5/5 motor in all extremities. Sensation is intact to light touch.  Skin: Skin is warm and dry. No rash noted. No erythema.  Psychiatric: His behavior is normal.  Patient is anxious appearing  Nursing note and vitals reviewed.   ED Course  Procedures   DIAGNOSTIC STUDIES:  Oxygen Saturation is 97% on RA, normal by my interpretation.    COORDINATION OF CARE:  11:41 PM Will order GI cocktail. Discussed treatment plan with pt at bedside and pt agreed to plan.  Labs Review Labs Reviewed  BASIC METABOLIC PANEL - Abnormal; Notable for the following:    Glucose, Bld 144 (*)    All other components within normal limits  HEPATIC FUNCTION PANEL - Abnormal; Notable for the following:    Bilirubin, Direct <0.1 (*)    All other components within normal limits  CBC  TROPONIN I  LIPASE, BLOOD    Imaging Review Dg Chest 2 View  01/05/2016  CLINICAL DATA:  Chest pain, hyperventilating. Pt c/o four very sharp mid-sternal chest pain episodes that brings him to his knees x 1 day. Pt also c/o SOB, numbness in his fingers and his right arm shaking when he feels the chest pains. Hx current smoker of 2 PPD. EXAM: CHEST  2 VIEW COMPARISON:  12/24/2015 FINDINGS: Heart, mediastinum and hila are within normal limits. Lungs are hyperexpanded. Mild linear atelectasis noted in the anterior lung base on the lateral view. No evidence of pneumonia or pulmonary edema. No pleural effusion or pneumothorax. Skeletal structures are unremarkable. IMPRESSION: No active cardiopulmonary disease. Electronically Signed   By: Amie Portland M.D.   On: 01/05/2016 20:42   Dg Abd 1 View  01/06/2016  CLINICAL DATA:  Epigastric abdominal pain for 2 days. EXAM: ABDOMEN - 1 VIEW COMPARISON:  None. FINDINGS: The bowel gas pattern is normal. Small volume of colonic stool. Multiple pelvic phleboliths without radiopaque  calculi. No osseous abnormality. IMPRESSION: Negative radiograph of the abdomen. Electronically Signed   By: Rubye Oaks M.D.   On: 01/06/2016 00:23   I have personally reviewed and evaluated these images and lab results as part of my medical decision-making.   EKG Interpretation   Date/Time:  Thursday January 05 2016 19:29:49 EST Ventricular Rate:  85 PR Interval:  150 QRS Duration: 80 QT Interval:  340 QTC Calculation: 404 R Axis:   90 Text Interpretation:  Normal sinus rhythm with sinus arrhythmia Rightward  axis Borderline ECG Confirmed by Ranae Palms  MD, Lakeasha Petion ([id number]) on 01/05/2016  11:27:16 PM      MDM   Final diagnoses:  Gastritis  Anxiety    I personally performed the services described in this documentation, which was scribed in my presence. The recorded information has been reviewed and is accurate.   The suspicion for coronary artery disease. Patient's had constant pain since the past 14 hours with a normal troponin and EKG with no evidence of ischemia. Likely gastritis versus pancreatitis versus abdominal wall strain. Believe this is  likely exacerbated by the patient's anxiety.   Loren Racer, MD 01/06/16 [phone]

## 2016-01-06 LAB — HEPATIC FUNCTION PANEL
ALT: 19 U/L (ref 17–63)
AST: 25 U/L (ref 15–41)
Albumin: 4.3 g/dL (ref 3.5–5.0)
Alkaline Phosphatase: 54 U/L (ref 38–126)
BILIRUBIN TOTAL: 0.6 mg/dL (ref 0.3–1.2)
Total Protein: 7.2 g/dL (ref 6.5–8.1)

## 2016-01-06 LAB — LIPASE, BLOOD: LIPASE: 31 U/L (ref 11–51)

## 2016-01-06 MED ORDER — ONDANSETRON HCL 4 MG PO TABS: 4.0000 mg | ORAL_TABLET | Freq: Three times a day (TID) | ORAL | Status: DC | PRN

## 2016-01-06 MED ORDER — SUCRALFATE 1 G PO TABS: 1.0000 g | ORAL_TABLET | Freq: Three times a day (TID) | ORAL | Status: DC | PRN

## 2016-01-06 MED ORDER — LANSOPRAZOLE 30 MG PO CPDR: 30.0000 mg | DELAYED_RELEASE_CAPSULE | Freq: Every day | ORAL | Status: DC

## 2016-01-06 NOTE — Discharge Instructions (Signed)
Gastritis, Adult Gastritis is soreness and swelling (inflammation) of the lining of the stomach. Gastritis can develop as a sudden onset (acute) or long-term (chronic) condition. If gastritis is not treated, it can lead to stomach bleeding and ulcers. CAUSES  Gastritis occurs when the stomach lining is weak or damaged. Digestive juices from the stomach then inflame the weakened stomach lining. The stomach lining may be weak or damaged due to viral or bacterial infections. One common bacterial infection is the Helicobacter pylori infection. Gastritis can also result from excessive alcohol consumption, taking certain medicines, or having too much acid in the stomach.  SYMPTOMS  In some cases, there are no symptoms. When symptoms are present, they may include:  Pain or a burning sensation in the upper abdomen.  Nausea.  Vomiting.  An uncomfortable feeling of fullness after eating. DIAGNOSIS  Your caregiver may suspect you have gastritis based on your symptoms and a physical exam. To determine the cause of your gastritis, your caregiver may perform the following:  Blood or stool tests to check for the H pylori bacterium.  Gastroscopy. A thin, flexible tube (endoscope) is passed down the esophagus and into the stomach. The endoscope has a light and camera on the end. Your caregiver uses the endoscope to view the inside of the stomach.  Taking a tissue sample (biopsy) from the stomach to examine under a microscope. TREATMENT  Depending on the cause of your gastritis, medicines may be prescribed. If you have a bacterial infection, such as an H pylori infection, antibiotics may be given. If your gastritis is caused by too much acid in the stomach, H2 blockers or antacids may be given. Your caregiver may recommend that you stop taking aspirin, ibuprofen, or other nonsteroidal anti-inflammatory drugs (NSAIDs). HOME CARE INSTRUCTIONS  Only take over-the-counter or prescription medicines as directed by  your caregiver.  If you were given antibiotic medicines, take them as directed. Finish them even if you start to feel better.  Drink enough fluids to keep your urine clear or pale yellow.  Avoid foods and drinks that make your symptoms worse, such as:  Caffeine or alcoholic drinks.  Chocolate.  Peppermint or mint flavorings.  Garlic and onions.  Spicy foods.  Citrus fruits, such as oranges, lemons, or limes.  Tomato-based foods such as sauce, chili, salsa, and pizza.  Fried and fatty foods.  Eat small, frequent meals instead of large meals. SEEK IMMEDIATE MEDICAL CARE IF:   You have black or dark red stools.  You vomit blood or material that looks like coffee grounds.  You are unable to keep fluids down.  Your abdominal pain gets worse.  You have a fever.  You do not feel better after 1 week.  You have any other questions or concerns. MAKE SURE YOU:  Understand these instructions.  Will watch your condition.  Will get help right away if you are not doing well or get worse.   This information is not intended to replace advice given to you by your health care provider. Make sure you discuss any questions you have with your health care provider.   Document Released: 10/23/2001 Document Revised: 04/29/2012 Document Reviewed: 12/12/2011 Elsevier Interactive Patient Education 2016 Elsevier Inc.  Generalized Anxiety Disorder Generalized anxiety disorder (GAD) is a mental disorder. It interferes with life functions, including relationships, work, and school. GAD is different from normal anxiety, which everyone experiences at some point in their lives in response to specific life events and activities. Normal anxiety actually helps [place]  prepare for and get through these life events and activities. Normal anxiety goes away after the event or activity is over.  GAD causes anxiety that is not necessarily related to specific events or activities. It also causes excess  anxiety in proportion to specific events or activities. The anxiety associated with GAD is also difficult to control. GAD can vary from mild to severe. People with severe GAD can have intense waves of anxiety with physical symptoms (panic attacks).  SYMPTOMS The anxiety and worry associated with GAD are difficult to control. This anxiety and worry are related to many life events and activities and also occur more days than not for 6 months or longer. People with GAD also have three or more of the following symptoms (one or more in children):  Restlessness.   Fatigue.  Difficulty concentrating.   Irritability.  Muscle tension.  Difficulty sleeping or unsatisfying sleep. DIAGNOSIS GAD is diagnosed through an assessment by your health care provider. Your health care provider will ask you questions aboutyour mood,physical symptoms, and events in your life. Your health care provider may ask you about your medical history and use of alcohol or drugs, including prescription medicines. Your health care provider may also do a physical exam and blood tests. Certain medical conditions and the use of certain substances can cause symptoms similar to those associated with GAD. Your health care provider may refer you to a mental health specialist for further evaluation. TREATMENT The following therapies are usually used to treat GAD:   Medication. Antidepressant medication usually is prescribed for long-term daily control. Antianxiety medicines may be added in severe cases, especially when panic attacks occur.   Talk therapy (psychotherapy). Certain types of talk therapy can be helpful in treating GAD by providing support, education, and guidance. A form of talk therapy called cognitive behavioral therapy can teach you healthy ways to think about and react to daily life events and activities.  Stress managementtechniques. These include yoga, meditation, and exercise and can be very helpful when they  are practiced regularly. A mental health specialist can help determine which treatment is best for you. Some people see improvement with one therapy. However, other people require a combination of therapies.   This information is not intended to replace advice given to you by your health care provider. Make sure you discuss any questions you have with your health care provider.   Document Released: 02/23/2013 Document Revised: 11/19/2014 Document Reviewed: 02/23/2013 Elsevier Interactive Patient Education [employer]

## 2016-07-15 DIAGNOSIS — G959 Disease of spinal cord, unspecified: Secondary | ICD-10-CM | POA: Insufficient documentation

## 2017-02-27 IMAGING — DX DG ABDOMEN 1V
1 series · 1 of 1 positions shown · non-contrast
Comparison: None.

CLINICAL DATA: Epigastric abdominal pain for 2 days.

EXAM:
ABDOMEN - 1 VIEW

[abdomen kub]
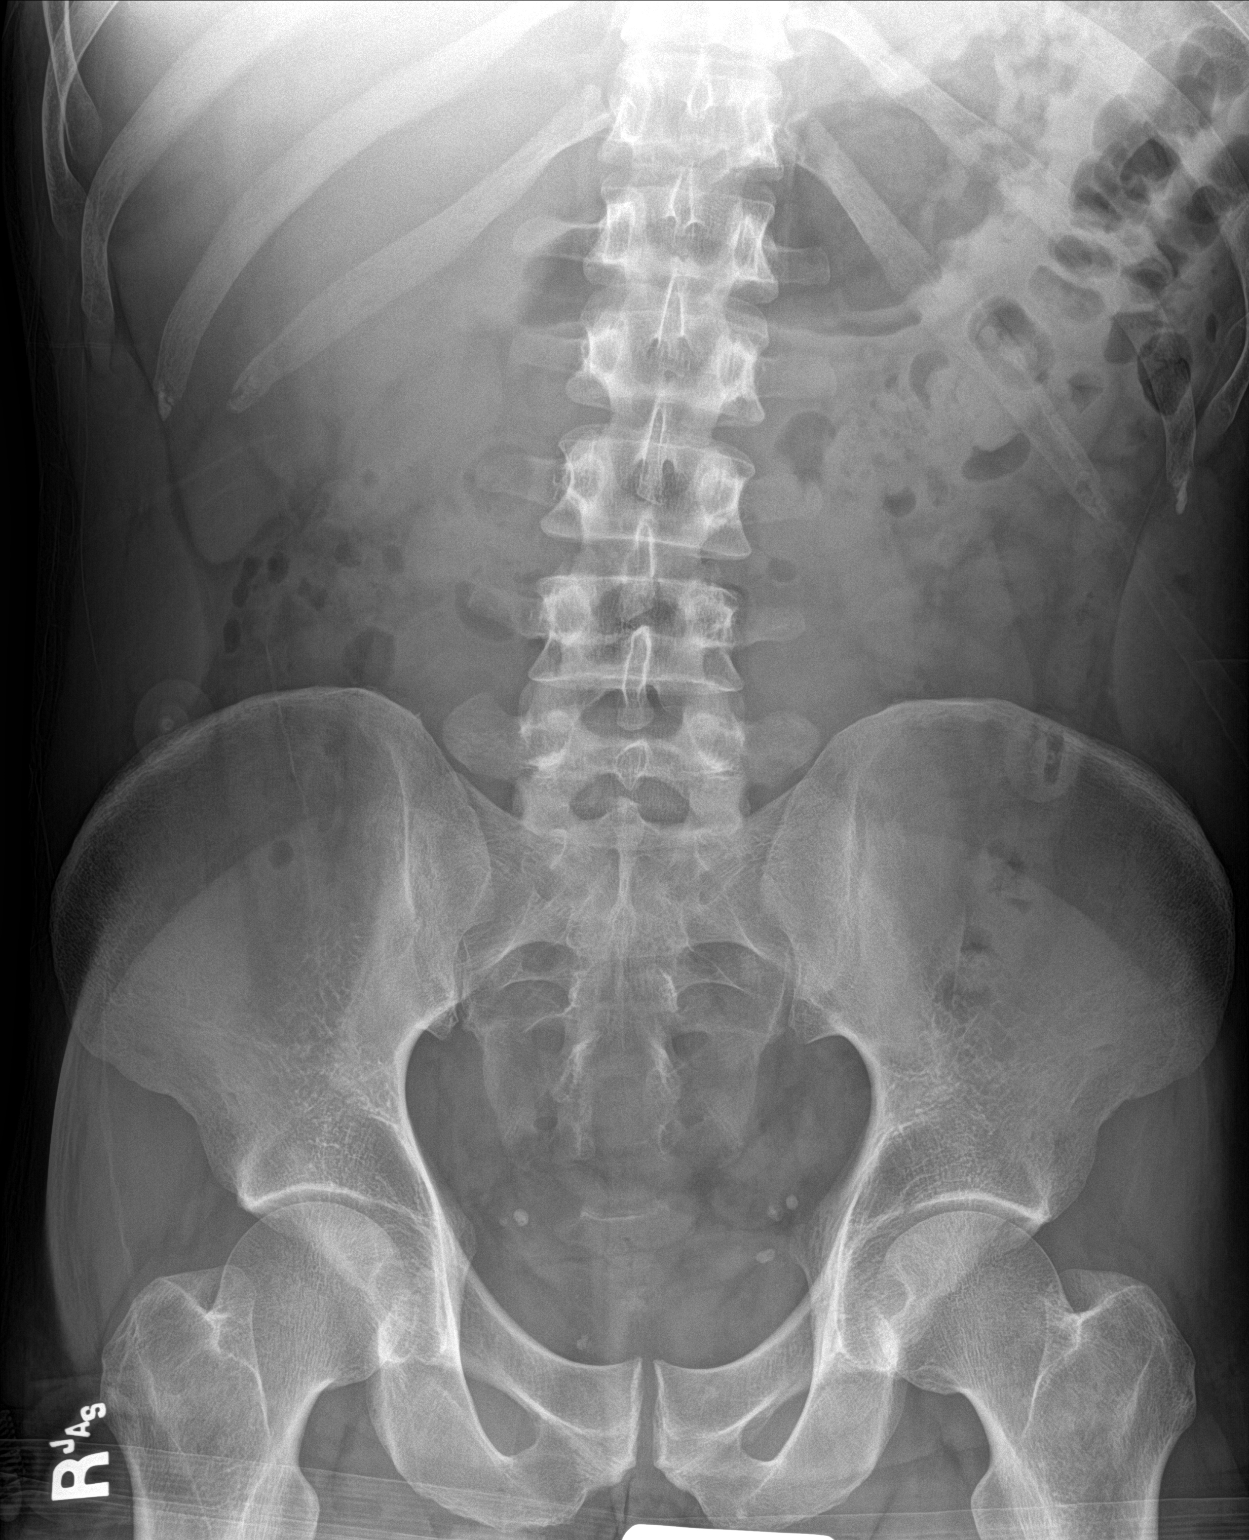

[1 of 1 positions shown; findings below may reference images not displayed]

FINDINGS: The bowel gas pattern is normal. Small volume of colonic stool.
Multiple pelvic phleboliths without radiopaque calculi. No osseous
abnormality.
IMPRESSION: Negative radiograph of the abdomen.

## 2017-02-27 IMAGING — CR DG CHEST 2V
2 series · 2 of 2 positions shown · non-contrast
Comparison: 12/24/2015

CLINICAL DATA: Chest pain, hyperventilating. Pt c/o four very sharp
mid-sternal chest pain episodes that brings him to his knees x 1
day. Pt also c/o SOB, numbness in his fingers and his right arm
shaking when he feels the chest pains. Hx current smoker of 2 PPD.

EXAM:
CHEST  2 VIEW

[chest pa]
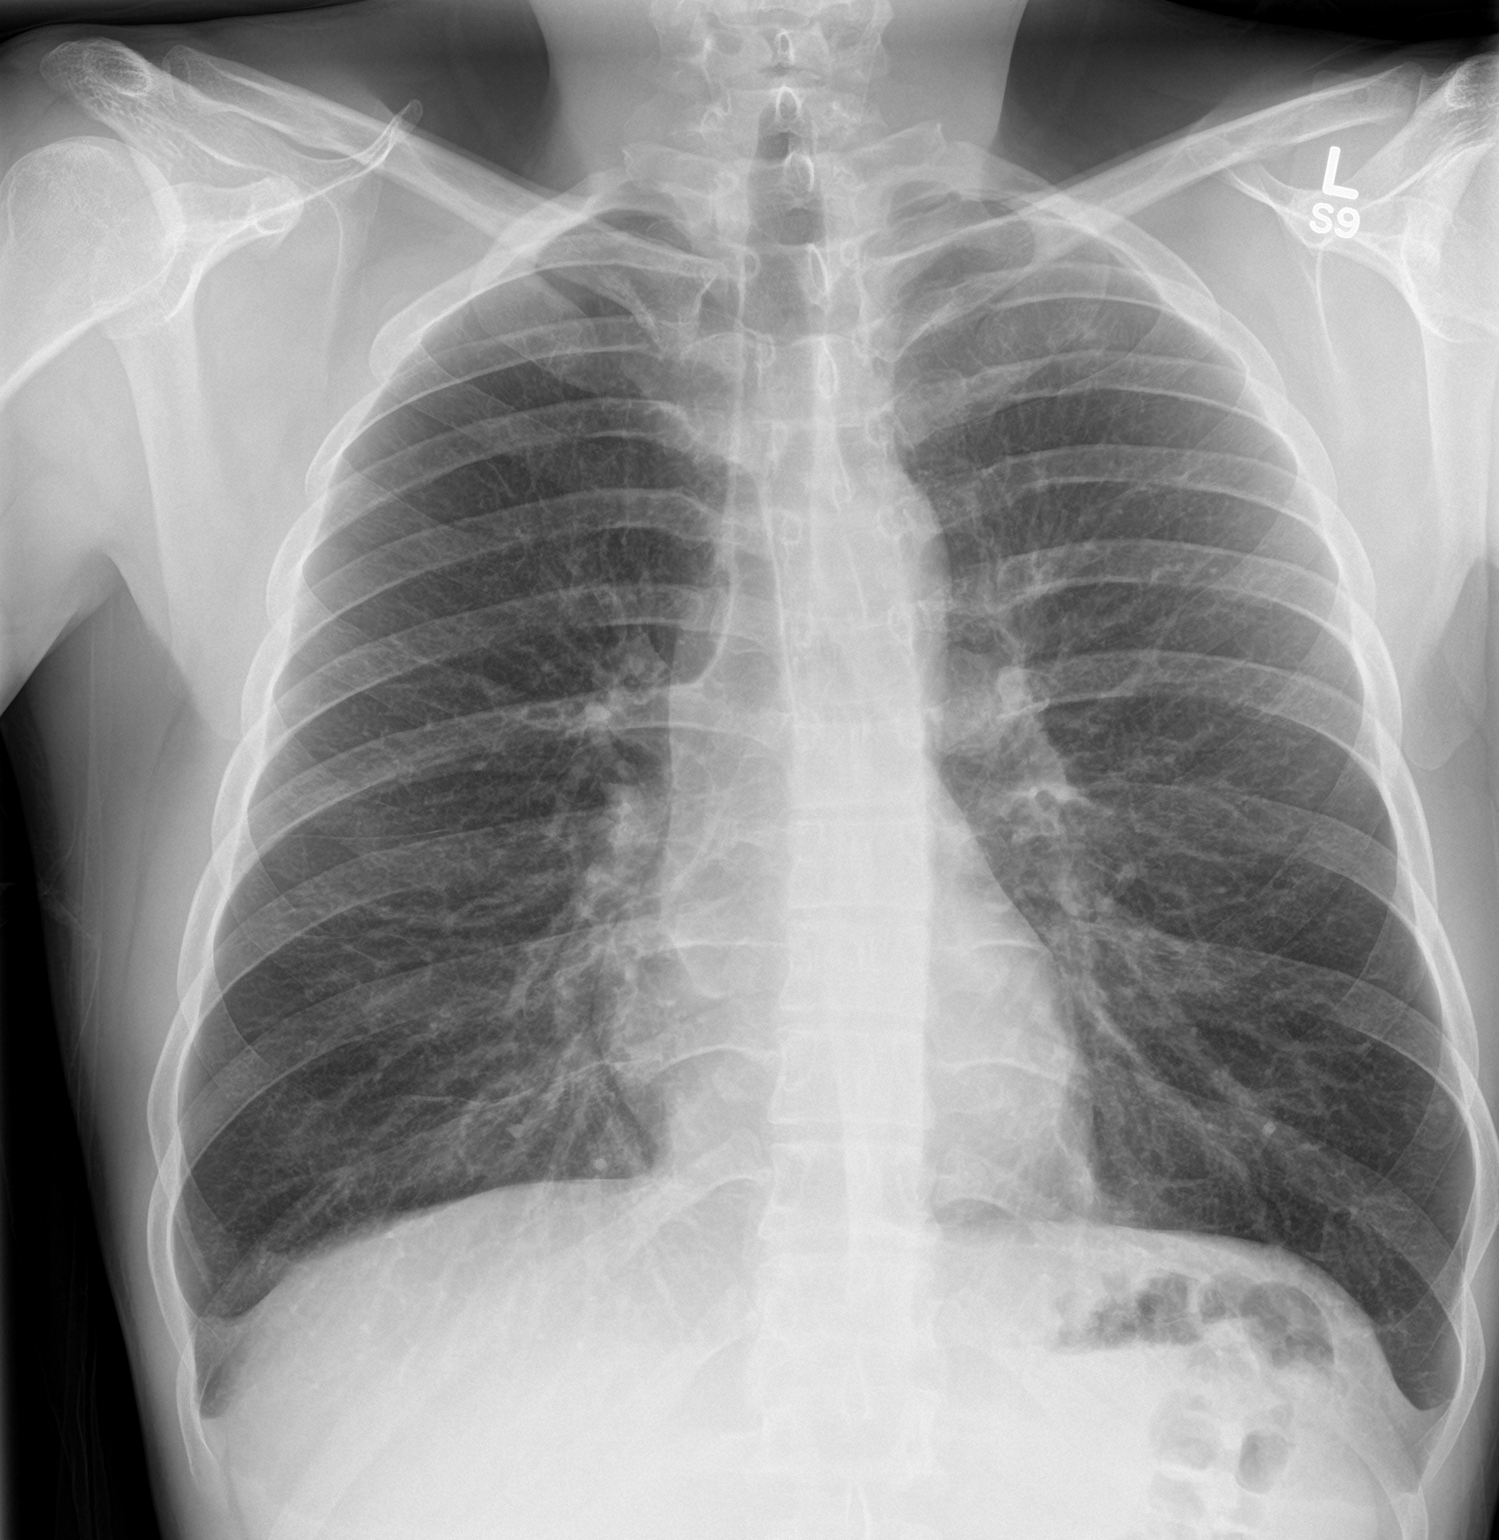

[chest lat]
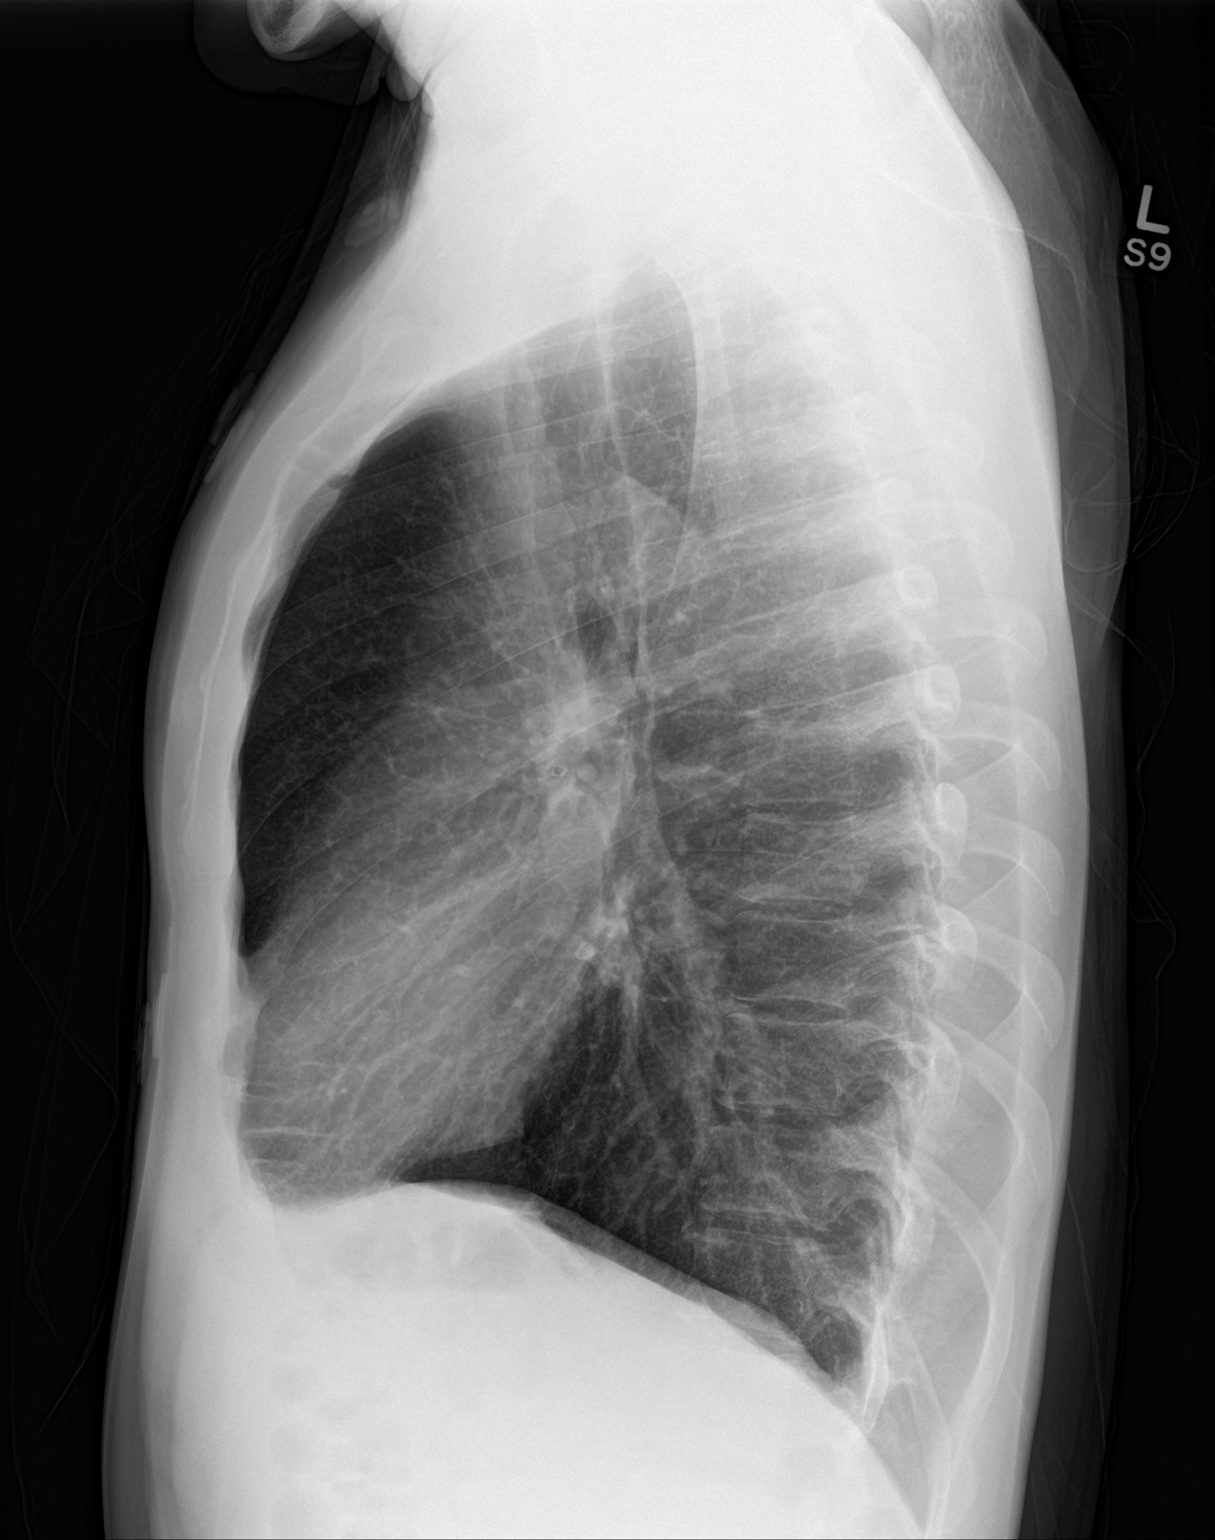

[2 of 2 positions shown; findings below may reference images not displayed]

FINDINGS: Heart, mediastinum and hila are within normal limits.

Lungs are hyperexpanded. Mild linear atelectasis noted in the
anterior lung base on the lateral view. No evidence of pneumonia or
pulmonary edema. No pleural effusion or pneumothorax.

Skeletal structures are unremarkable.
IMPRESSION: No active cardiopulmonary disease.

## 2019-08-19 ENCOUNTER — Encounter (HOSPITAL_COMMUNITY): Payer: Self-pay

## 2019-08-19 ENCOUNTER — Other Ambulatory Visit: Payer: Self-pay

## 2019-08-19 ENCOUNTER — Emergency Department (HOSPITAL_COMMUNITY)
Admission: EM | Admit: 2019-08-19 | Discharge: 2019-08-20 | Disposition: A | Discharge status: Home / Self Care | Payer: Medicare HMO | Attending: Emergency Medicine | Admitting: Emergency Medicine

## 2019-08-19 DIAGNOSIS — T43691A Poisoning by other psychostimulants, accidental (unintentional), initial encounter: Secondary | ICD-10-CM | POA: Insufficient documentation

## 2019-08-19 DIAGNOSIS — F1190 Opioid use, unspecified, uncomplicated: Secondary | ICD-10-CM | POA: Diagnosis not present

## 2019-08-19 DIAGNOSIS — R7401 Elevation of levels of liver transaminase levels: Secondary | ICD-10-CM | POA: Diagnosis not present

## 2019-08-19 DIAGNOSIS — F17210 Nicotine dependence, cigarettes, uncomplicated: Secondary | ICD-10-CM | POA: Diagnosis not present

## 2019-08-19 DIAGNOSIS — T50901A Poisoning by unspecified drugs, medicaments and biological substances, accidental (unintentional), initial encounter: Secondary | ICD-10-CM

## 2019-08-19 LAB — COMPREHENSIVE METABOLIC PANEL
ALT: 228 U/L — ABNORMAL HIGH (ref 0–44)
AST: 155 U/L — ABNORMAL HIGH (ref 15–41)
Albumin: 4.3 g/dL (ref 3.5–5.0)
Alkaline Phosphatase: 69 U/L (ref 38–126)
Anion gap: 12 (ref 5–15)
BUN: 23 mg/dL — ABNORMAL HIGH (ref 6–20)
CO2: 19 mmol/L — ABNORMAL LOW (ref 22–32)
Calcium: 8.7 mg/dL — ABNORMAL LOW (ref 8.9–10.3)
Chloride: 104 mmol/L (ref 98–111)
Creatinine, Ser: 1.43 mg/dL — ABNORMAL HIGH (ref 0.61–1.24)
GFR calc Af Amer: >60 mL/min (ref >60)
GFR calc non Af Amer: >60 mL/min (ref >60)
Glucose, Bld: 99 mg/dL (ref 70–99)
Potassium: 3.9 mmol/L (ref 3.5–5.1)
Sodium: 135 mmol/L (ref 135–145)
Total Bilirubin: 1.1 mg/dL (ref 0.3–1.2)
Total Protein: 7.9 g/dL (ref 6.5–8.1)

## 2019-08-19 LAB — SALICYLATE LEVEL: Salicylate Lvl: <7 mg/dL (ref 2.8–30.0)

## 2019-08-19 LAB — CBC
HCT: 41.4 % (ref 39.0–52.0)
Hemoglobin: 13.9 g/dL (ref 13.0–17.0)
MCH: 32.3 pg (ref 26.0–34.0)
MCHC: 33.6 g/dL (ref 30.0–36.0)
MCV: 96.1 fL (ref 80.0–100.0)
Platelets: 234 10*3/uL (ref 150–400)
RBC: 4.31 MIL/uL (ref 4.22–5.81)
RDW: 15.6 % — ABNORMAL HIGH (ref 11.5–15.5)
WBC: 15.5 10*3/uL — ABNORMAL HIGH (ref 4.0–10.5)
nRBC: 0 % (ref 0.0–0.2)

## 2019-08-19 LAB — ACETAMINOPHEN LEVEL: Acetaminophen (Tylenol), Serum: <10 ug/mL — ABNORMAL LOW (ref 10–30)

## 2019-08-19 LAB — ETHANOL: Alcohol, Ethyl (B): <10 mg/dL (ref <10)

## 2019-08-19 LAB — CBG MONITORING, ED: Glucose-Capillary: 137 mg/dL — ABNORMAL HIGH (ref 70–99)

## 2019-08-19 NOTE — ED Notes (Signed)
Patient removed [place] that was placed by EMS. Patient placed back on [place] and instructed to leave on at this time. Patient in NAD at this time.

## 2019-08-19 NOTE — ED Triage Notes (Signed)
Pt BIB [facility] from [employer]. He states that he thought he was taking Adderall, but Narcan was administered by [facility] and worked. Pt went in to SVT for a short period with EMS, but they state that it resolved with fluid.

## 2019-08-19 NOTE — ED Notes (Signed)
Pt states he is extremely hot. And he has to take "double breathes" in order to catch his breath. Pt placed of 2L of nasal cannula. When pt falls asleep 02 stats fall to 85%. Heart rate staying above 130

## 2019-08-19 NOTE — ED Provider Notes (Signed)
[facility]-EMERGENCY DEPT Provider Note   CSN: [id number] Arrival date & time: 08/19/19  2122     History   Chief Complaint Chief Complaint  Patient presents with  . Drug Overdose    HPI Jeffrey Holland is a 33 y.o. male.     The history is provided by the patient and medical records.  Drug Overdose     33 y.o. M with hx of anxiety, presenting to the ED after an accidental drug OD.  Patient states he thought he was snorting adderall which he has done in the past but may have been something different.  He was given narcan be EMS with good response and return of consciousness.  They did report short run of SVT but resolved with fluid.  Patient does admit to doing some heroin earlier today, unsure of time.  He states he does not use heroin daily, just when he is having pain.  States "I used to be on a lot of uppers" and sometimes it makes him have recurrent pain.  He denies currently being prescribed any narcotics.  He denies any EtOH.  Past Medical History:  Diagnosis Date  . Anxiety   . Panic     There are no active problems to display for this patient.   History reviewed. No pertinent surgical history.      Home Medications    Prior to Admission medications   Medication Sig Start Date End Date Taking? Authorizing Provider  famotidine (PEPCID) 20 MG tablet Take 1 tablet (20 mg total) by mouth 2 (two) times daily. Patient not taking: Reported on 01/06/2016 06/23/14   Lurene Shadow, PA-C  lansoprazole (PREVACID) 30 MG capsule Take 1 capsule (30 mg total) by mouth daily at 12 noon. Patient not taking: Reported on 08/19/2019 01/06/16   Loren Racer, MD  ondansetron (ZOFRAN) 4 MG tablet Take 1 tablet (4 mg total) by mouth every 8 (eight) hours as needed for nausea or vomiting. Patient not taking: Reported on 08/19/2019 01/06/16   Loren Racer, MD  sucralfate (CARAFATE) 1 g tablet Take 1 tablet (1 g total) by mouth 3 (three) times daily as needed  (epigastric pain). Patient not taking: Reported on 08/19/2019 01/06/16   Loren Racer, MD    Family History History reviewed. No pertinent family history.  Social History Social History   Tobacco Use  . Smoking status: Current Every Day Smoker    Packs/day: 1.00    Types: Cigarettes  Substance Use Topics  . Alcohol use: Yes  . Drug use: Yes    Types: Marijuana     Allergies   Patient has no known allergies.   Review of Systems Review of Systems  Constitutional:       OD  All other systems reviewed and are negative.    Physical Exam Updated Vital Signs BP 127/86   Pulse (!) 144   Temp 99 F (37.2 C) (Oral)   Resp 15   Ht 5\' 6"  (1.676 m)   Wt 77.1 kg   SpO2 93%   BMI 27.44 kg/m   Physical Exam Vitals signs and nursing note reviewed.  Constitutional:      Appearance: He is well-developed.     Comments: Drowsy but able to answer questions and follow commands, clammy  HENT:     Head: Normocephalic and atraumatic.  Eyes:     Conjunctiva/sclera: Conjunctivae normal.     Pupils: Pupils are equal, round, and reactive to light.  Neck:  Musculoskeletal: Normal range of motion.  Cardiovascular:     Rate and Rhythm: Normal rate and regular rhythm.     Heart sounds: Normal heart sounds.  Pulmonary:     Effort: Pulmonary effort is normal. No respiratory distress.     Breath sounds: Normal breath sounds. No stridor.  Abdominal:     General: Bowel sounds are normal.     Palpations: Abdomen is soft.  Musculoskeletal: Normal range of motion.  Skin:    General: Skin is warm and dry.  Neurological:     Mental Status: He is alert and oriented to person, place, and time.      ED Treatments / Results  Labs (all labs ordered are listed, but only abnormal results are displayed) Labs Reviewed  CBG MONITORING, ED - Abnormal; Notable for the following components:      Result Value   Glucose-Capillary 137 (*)    All other components within normal limits   COMPREHENSIVE METABOLIC PANEL  ETHANOL  SALICYLATE LEVEL  ACETAMINOPHEN LEVEL  CBC  RAPID URINE DRUG SCREEN, HOSP PERFORMED    EKG EKG Interpretation  Date/Time:  Wednesday August 19 2019 21:38:06 EDT Ventricular Rate:  133 PR Interval:    QRS Duration: 87 QT Interval:  296 QTC Calculation: 441 R Axis:   68 Text Interpretation:  Sinus tachycardia Borderline low voltage, extremity leads Baseline wander in lead(s) I II III aVR aVF V2 V3 V4 V5 V6 TECHNICALLY DIFFICULT Otherwise no significant change Confirmed by Melene PlanFloyd, Dan [phone]([id number]) on 08/19/2019 10:08:01 PM   Radiology No results found.  Procedures Procedures (including critical care time)  Medications Ordered in ED Medications - No data to display   Initial Impression / Assessment and Plan / ED Course  I have reviewed the triage vital signs and the nursing notes.  Pertinent labs & imaging results that were available during my care of the patient were reviewed by me and considered in my medical decision making (see chart for details).  33 year old male presenting to the ED with accidental drug overdose.  [place] was [place] and reportedly thought he was snorting Adderall.  He was unresponsive upon EMS arrival but this improved with Narcan.  He does admit to using heroin earlier in the day.  States "I used to be on a lot of uppers" but denies currently being on any narcotics.  He is drowsy here but able to answer questions and follow commands.  He is tachycardic but this appears to be normal sinus.  There was reported episode of SVT with EMS, however no rhythm strip provided.  Patient will be given IV fluids.  Labs are pending.  Will monitor here.  Labs as above, mild AKI with serum creatinine of 1.43, BUN 23.  Likely setting of dehydration.   IVF have been given.  LFTs are elevated.  No prior values for comparison since 2017, may be normal progression.  His Tylenol level is normal.  He is not currently on a statin.  Ethanol  is normal today but he does admit to some alcohol use recently.  5:48 AM Patient has been monitored overnight, no acute events.  He is now awake, alert, oriented.  His vital signs are stable and tachycardia has fully resolved.  He denies any current abdominal pain.  He is tolerating PO without issue.  He denies any attempt at self harm or SI.  He states "I was just trying to have a good time".  Feel he is stable for  discharge home.  He was made aware of elevated LFT's again, recommended to avoid tylenol, alcohol, and other hepatotoxic substances/medications.  Will need to have these re-checked in 2-3 weeks.  Given follow-up with patient care center.  He may return here for any new/acute changes.  Final Clinical Impressions(s) / ED Diagnoses   Final diagnoses:  Accidental drug overdose, initial encounter  Transaminitis    ED Discharge Orders    None       Larene Pickett, PA-C 08/20/19 [place], [place], DO 08/22/19 [phone]

## 2019-08-19 NOTE — ED Notes (Signed)
Patient provided urinal and made aware that urine sample is needed. Patient SpO2 dropped to 88% on RA. Placed 2 L of O2 via [place] on patient and instructed to keep on. Patient in NAD at this time.

## 2019-08-20 LAB — RAPID URINE DRUG SCREEN, HOSP PERFORMED
Amphetamines: POSITIVE — AB
Barbiturates: NOT DETECTED
Benzodiazepines: NOT DETECTED
Cocaine: NOT DETECTED
Opiates: POSITIVE — AB
Tetrahydrocannabinol: POSITIVE — AB

## 2019-08-20 NOTE — ED Notes (Signed)
Patient alert and oriented x4, stable, and ambulatory without assistance at time of discharge.

## 2019-08-20 NOTE — Discharge Instructions (Addendum)
Your liver enzymes today were elevated.  I would try to avoid alcohol and tylenol for now as well as any illicit substances or medications not prescribed to you. Follow-up with primary care doctor to have your liver tests re-checked in the next 2-3 weeks.  If you do not have one, call the health and wellness center. Return here for any new/acute changes.

## 2021-11-12 ENCOUNTER — Ambulatory Visit (HOSPITAL_COMMUNITY)
Admission: EM | Admit: 2021-11-12 | Discharge: 2021-11-12 | Disposition: A | Discharge status: Home / Self Care | Payer: No Payment, Other | Attending: Nurse Practitioner | Admitting: Nurse Practitioner

## 2021-11-12 DIAGNOSIS — F1191 Opioid use, unspecified, in remission: Secondary | ICD-10-CM | POA: Insufficient documentation

## 2021-11-12 DIAGNOSIS — Z634 Disappearance and death of family member: Secondary | ICD-10-CM | POA: Insufficient documentation

## 2021-11-12 DIAGNOSIS — F419 Anxiety disorder, unspecified: Secondary | ICD-10-CM | POA: Insufficient documentation

## 2021-11-12 DIAGNOSIS — F1591 Other stimulant use, unspecified, in remission: Secondary | ICD-10-CM | POA: Insufficient documentation

## 2021-11-12 DIAGNOSIS — Z6379 Other stressful life events affecting family and household: Secondary | ICD-10-CM | POA: Insufficient documentation

## 2021-11-12 DIAGNOSIS — F29 Unspecified psychosis not due to a substance or known physiological condition: Secondary | ICD-10-CM | POA: Insufficient documentation

## 2021-11-12 DIAGNOSIS — Z9152 Personal history of nonsuicidal self-harm: Secondary | ICD-10-CM | POA: Insufficient documentation

## 2021-11-12 DIAGNOSIS — F32A Depression, unspecified: Secondary | ICD-10-CM | POA: Insufficient documentation

## 2021-11-12 DIAGNOSIS — F1290 Cannabis use, unspecified, uncomplicated: Secondary | ICD-10-CM | POA: Insufficient documentation

## 2021-11-12 NOTE — ED Provider Notes (Signed)
Behavioral Health Urgent Care Medical Screening Exam  Patient Name: Jeffrey Holland MRN: [id number] Date of Evaluation: 11/12/21 Chief Complaint:  My parents brought me here Diagnosis:  Final diagnoses:  Psychosis, unspecified psychosis type ([place])    History of Present illness: Jeffrey Holland is a 36 y.o. male single presenting voluntarily here to [facility] for a psychiatric evaluation. Patient reports that he lives with his parents and works as a Curator. Patient reports that his current stressor is that he has a 2 y/o child but has only seen a picture of the child and the mother refuses to allow him to see the child because the child's mother was married to someone else when the his son was conceived. Patient states not being able to see the child has caused him to be depressed. Patient also endorses methamphetamine and heroin use, but states that he has been clean for 2 months. Patient reports that he was smoking about 30.00-40.00 daily of heroin. Patient reports that he first tried marijuana at 36 years old and then consistently smoked from age 32 to his 19s and then started using methamphetamines and heroin. Patient denies any previous medication trials or inpatient treatment.  Patient endorses feeling of anxiety, restlessness, racing thoughts, sadness, depression, and anhedonia. Patient reports that when he is anxious his chest feels tight and heart rate increases. Patient endorsed hearing voices, someone calling his name, stated, "sometimes it feels like people are talking inside my head". Patient also stated that he will see black spots shooting out of his eyes when he becomes angry, anxious or frustrated. Patient reports that his brother at age 8 y/o committed suicide when he was 36 y/o and he was present when the police came to his home and informed. Patient is interested in outpatient services for medication management and therapy.  Per chart review patient had accidental overdose of  heroin on 08/18/2021 and was given narcan and also presented on 12/24/2015 for tremors related to alcohol use and given resources for outpatient resources but patient did not follow up.   Patient gave verbal permission for his parents to be contacted by NP for collateral information.Patient's mother was called but she was not home according to the person who answered the phone.   Jeffrey Holland is a 36 year old male presenting voluntary as a walk-in to [facility] requesting detox from drugs. 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 Patient denied SI and HI. Patient denied prior suicide attempts and self-harming behaviors. Patient reported "chip recorder in my head, mind scrambling like a VHS tape, rewinding, fast forwarding, hard to explain". Patient reported not sleeping in the past 2 nights. Patient denied command hallucinations to hurt self or others. Patient reported worsening depressive symptoms. Patient contracted for safety. Patient reported his parents brought him. Patient gave consent to speak with parents. TTS clinician attempted to contact mother, Jeffrey Holland at [phone] was the wrong number and [phone] patient was not available to answer phone call.  Psychiatric Specialty Exam  Presentation  General Appearance:Casual; Disheveled  Eye Contact:Fair  Speech:Clear and Coherent  Speech Volume:Normal  Handedness:Right   Mood and Affect  Mood:Anxious; Depressed  Affect:Blunt; Flat   Thought Process  Thought Processes:Goal Directed  Descriptions of Associations:Intact  Orientation:Full (Time, Place and Person)  Thought Content:Tangential    Hallucinations:Auditory Here voices denies any commands  Ideas of Reference:Other (comment)  Suicidal Thoughts:No  Homicidal Thoughts:No   Sensorium  Memory:Immediate Fair; Recent Fair; Remote  Fair  Judgment:Fair  Insight:Fair   Executive Functions  Concentration:Good  Attention Span:Fair  [place]  Language:Good   Psychomotor Activity  Psychomotor Activity:Normal   Assets  Assets:Communication Skills; Desire for Improvement; Financial Resources/Insurance; Housing; Physical Health; Resilience; Social Support; Vocational/Educational   Sleep  Sleep:Fair  Number of hours: -1   No data recorded  Physical Exam: Physical Exam HENT:     Head: Normocephalic and atraumatic.     Nose: Nose normal.  Eyes:     Pupils: Pupils are equal, round, and reactive to light.  Cardiovascular:     Rate and Rhythm: Normal rate.  Pulmonary:     Effort: Pulmonary effort is normal.  Abdominal:     General: Abdomen is flat.  Musculoskeletal:        General: Normal range of motion.     Cervical back: Normal range of motion.  Skin:    General: Skin is warm.  Neurological:     Mental Status: He is alert and oriented to person, place, and time.  Psychiatric:        Attention and Perception: He perceives auditory hallucinations.        Mood and Affect: Mood is anxious.        Speech: Speech normal.        Behavior: Behavior is cooperative.        Thought Content: Thought content is delusional. Thought content is not paranoid. Thought content does not include homicidal or suicidal ideation. Thought content does not include homicidal or suicidal plan.        Cognition and Memory: Cognition normal.        Judgment: Judgment is impulsive.   Review of Systems  Constitutional: Negative.   HENT: Negative.    Eyes: Negative.   Respiratory: Negative.    Cardiovascular: Negative.   Gastrointestinal: Negative.   Genitourinary: Negative.   Musculoskeletal: Negative.   Skin: Negative.   Neurological: Negative.   Endo/Heme/Allergies: Negative.   Psychiatric/Behavioral:  Positive for depression, hallucinations and substance abuse.   Blood pressure  122/80, pulse 81, temperature 98.4 F (36.9 C), temperature source Oral, resp. rate 18, SpO2 99 %. There is no height or weight on file to calculate BMI.  Musculoskeletal: Strength & Muscle Tone: within normal limits Gait & Station: normal Patient leans: N/A   [place] MSE Discharge Disposition for Follow up and Recommendations: Based on my evaluation the patient does not appear to have an emergency medical condition and can be discharged with resources and follow up care in outpatient services for Medication Management and Individual Therapy Patient to follow up with [facility] outpatient clinic during walk in hours on 11/13/2021 to get established with a medication management.   Lucia Bitter, NP 11/12/2021, 10:06 PM

## 2021-11-12 NOTE — Discharge Instructions (Addendum)
°  Discharge recommendations:  Patient is to take medications as prescribed. Please see information for follow-up appointment with psychiatry and therapy. Please follow up with your primary care provider for all medical related needs.  Please come to the [facility] Outpatient walk in clinic for medication management and therapy on 11/13/2021 at 7:30am.  Therapy: We recommend that patient participate in individual therapy to address mental health concerns.  Medications: The parent/guardian is to contact a medical professional and/or outpatient provider to address any new side effects that develop. Parent/guardian should update outpatient providers of any new medications and/or medication changes.   Atypical antipsychotics: If you are prescribed an atypical antipsychotic, it is recommended that your height, weight, BMI, blood pressure, fasting lipid panel, and fasting blood sugar be monitored by your outpatient providers.  Safety:  The patient should abstain from use of illicit substances/drugs and abuse of any medications. If symptoms worsen or do not continue to improve or if the patient becomes actively suicidal or homicidal then it is recommended that the patient return to the closest hospital emergency department, the [facility], or call 911 for further evaluation and treatment. National Suicide Prevention Lifeline 1-800-SUICIDE or 804-298-0501.  About 988 988 offers 24/7 access to trained crisis counselors who can help people experiencing mental health-related distress. People can call or text 988 or chat 988lifeline.org for themselves or if they are worried about a loved one who may need crisis support.

## 2021-11-13 ENCOUNTER — Other Ambulatory Visit: Payer: Self-pay

## 2021-11-13 ENCOUNTER — Encounter (HOSPITAL_COMMUNITY): Payer: Self-pay | Admitting: Emergency Medicine

## 2021-11-13 ENCOUNTER — Emergency Department (HOSPITAL_COMMUNITY): Payer: Self-pay

## 2021-11-13 ENCOUNTER — Emergency Department (HOSPITAL_COMMUNITY)
Admission: EM | Admit: 2021-11-13 | Discharge: 2021-11-13 | Disposition: A | Discharge status: Home / Self Care | Payer: Self-pay | Attending: Emergency Medicine | Admitting: Emergency Medicine

## 2021-11-13 DIAGNOSIS — R443 Hallucinations, unspecified: Secondary | ICD-10-CM | POA: Insufficient documentation

## 2021-11-13 DIAGNOSIS — Z20822 Contact with and (suspected) exposure to covid-19: Secondary | ICD-10-CM | POA: Insufficient documentation

## 2021-11-13 DIAGNOSIS — R519 Headache, unspecified: Secondary | ICD-10-CM | POA: Insufficient documentation

## 2021-11-13 LAB — CBC
HCT: 44.7 % (ref 39.0–52.0)
Hemoglobin: 15.2 g/dL (ref 13.0–17.0)
MCH: 33.9 pg (ref 26.0–34.0)
MCHC: 34 g/dL (ref 30.0–36.0)
MCV: 99.6 fL (ref 80.0–100.0)
Platelets: 289 10*3/uL (ref 150–400)
RBC: 4.49 MIL/uL (ref 4.22–5.81)
RDW: 12.7 % (ref 11.5–15.5)
WBC: 11.7 10*3/uL — ABNORMAL HIGH (ref 4.0–10.5)
nRBC: 0 % (ref 0.0–0.2)

## 2021-11-13 LAB — COMPREHENSIVE METABOLIC PANEL
ALT: 83 U/L — ABNORMAL HIGH (ref 0–44)
AST: 50 U/L — ABNORMAL HIGH (ref 15–41)
Albumin: 4 g/dL (ref 3.5–5.0)
Alkaline Phosphatase: 50 U/L (ref 38–126)
Anion gap: 10 (ref 5–15)
BUN: 12 mg/dL (ref 6–20)
CO2: 23 mmol/L (ref 22–32)
Calcium: 8.9 mg/dL (ref 8.9–10.3)
Chloride: 102 mmol/L (ref 98–111)
Creatinine, Ser: 0.86 mg/dL (ref 0.61–1.24)
GFR, Estimated: >60 mL/min (ref >60)
Glucose, Bld: 130 mg/dL — ABNORMAL HIGH (ref 70–99)
Potassium: 3.6 mmol/L (ref 3.5–5.1)
Sodium: 135 mmol/L (ref 135–145)
Total Bilirubin: 1 mg/dL (ref 0.3–1.2)
Total Protein: 7.2 g/dL (ref 6.5–8.1)

## 2021-11-13 LAB — RESP PANEL BY RT-PCR (FLU A&B, COVID) ARPGX2
Influenza A by PCR: NEGATIVE
Influenza B by PCR: NEGATIVE
SARS Coronavirus 2 by RT PCR: NEGATIVE

## 2021-11-13 LAB — SALICYLATE LEVEL: Salicylate Lvl: <7 mg/dL — ABNORMAL LOW (ref 7.0–30.0)

## 2021-11-13 LAB — ETHANOL: Alcohol, Ethyl (B): <10 mg/dL (ref <10)

## 2021-11-13 LAB — ACETAMINOPHEN LEVEL: Acetaminophen (Tylenol), Serum: 14 ug/mL (ref 10–30)

## 2021-11-13 MED ORDER — NICOTINE 7 MG/24HR TD PT24
7.0000 mg | MEDICATED_PATCH | Freq: Once | TRANSDERMAL | Status: DC
  Administered 2021-11-13: 7 mg via TRANSDERMAL
  Filled 2021-11-13: qty 1

## 2021-11-13 NOTE — ED Provider Notes (Cosign Needed)
Emergency Medicine Provider Triage Evaluation Note  Jeffrey Holland , a 36 y.o. male  was evaluated in triage.  Pt complains of auditory hallucinations.  Occasionally visual.  Denies SI/HI.  Mother reports this has been going on for the past few days.  No history of mental health illness.  Denies alcohol or drug use.  Review of Systems  As above  Physical Exam  BP (!) 147/90    Pulse (!) 117    Temp 99 F (37.2 C) (Oral)    Resp 16    SpO2 100%  Gen:   Awake, no distress   Resp:  Normal effort  MSK:   Moves extremities without difficulty  Other:  Patient very jittery and anxious, reporting that we need to get the voices out of his head.  States he has been having "psychotic thoughts."  Medical Decision Making  Medically screening exam initiated at 11:17 AM.  Appropriate orders placed.  Jeffrey Holland was informed that the remainder of the evaluation will be completed by another provider, this initial triage assessment does not replace that evaluation, and the importance of remaining in the ED until their evaluation is complete.  This chart was dictated using voice recognition software.  Despite best efforts to proofread,  errors can occur which can change the documentation meaning.    Rhae Hammock, PA-C 11/13/21 1118

## 2021-11-13 NOTE — ED Notes (Signed)
Pt is sitting near door and keeps attempting to walk through them, able to verbally redirect but pt appears to be becoming more anxious. Pt keeps stating that they're waiting on discharge paperwork but pt has not been evaluated by the psychiatrist yet. RN offered comfort items as in warm blankets, snacks, drink, pt denied. Pt is in no acute distress, able to reposition self appropriately.

## 2021-11-13 NOTE — Progress Notes (Signed)
°   11/12/21 [place] (Walk-ins at [facility] only)  How Did You Hear About [place]? Self  What Is the Reason for Your Visit/Call Today? 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 Patient denied SI and HI. Patient denied prior suicide attempts and self-harming behaviors. Patient reported "chip recorder in my head, mind scrambling like a VHS tape, rewinding, fast forwarding, hard to explain". Patient reported not sleeping in the past 2 nights. Patient denied command hallucinations to hurt self or others. Patient reported worsening depressive symptoms. Patient contracted for safety.     Patient reported his parents brought him. Patient gave consent to speak with parents. TTS clinician attempted to contact mother, Shelia Ohler at [phone] was the wrong number and [phone] patient was not available to answer phone call.  How Long Has This Been Causing You Problems? 1 wk - 1 month  Have You Recently Had Any Thoughts About Hurting Yourself? No  Are You Planning to Commit Suicide/Harm Yourself At This time? No  Have you Recently Had Thoughts About [place]? No  Are You Planning To Harm Someone At This Time? No  Are you currently experiencing any auditory, visual or other hallucinations? Yes  Please explain the hallucinations you are currently experiencing: "chip recorder in head, mind scrambling like a VHS tape, rewinding, fast forwarding, hard to explain"  Have You Used Any Alcohol or Drugs in the Past 24 Hours? No  Do you have any current medical co-morbidities that require immediate attention? No  Clinician description of patient physical appearance/behavior: casual and cooperative  What Do You Feel Would Help You the Most Today? Treatment for Depression or other mood problem  If access  to [facility] Urgent Care was not available, would you have sought care in the Emergency Department? Yes  Determination of Need Routine (7 days)  Options For Referral Outpatient Therapy;Medication Management

## 2021-11-13 NOTE — ED Notes (Addendum)
Pt updated on POC, r/t pt voluntary status pt wants to leave AMA. DO aware, pt family present and educated on reasons to stay. Pt stated "There ain't no way I'm staying here." All of pt's belongings returned to pt, educated on reasons to return.

## 2021-11-13 NOTE — Discharge Instructions (Signed)
[facility] ([address], [place], [place] [address], [place], [phone]) Outpatient 2nd floor medication management/therapy walk-in hours:   Therapy Walk-in Hours  Monday-Wednesday: 8 AM until slots are full  Friday: 1 PM to 5 PM  For Monday to Wednesday, it is recommended that patients arrive between 7:30 AM and 7:45 AM because patients will be seen in the order of arrival.  For Friday, we ask that patients arrive between 12 PM to 12:30 PM.  Go to the second floor on arrival and check in.  **Availability is limited; therefore, patients may not be seen on the same day.**  Medication management walk-ins:  Monday to Friday: 8 AM to 11 AM.  It is recommended that patients arrive by 7:30 AM to 7:45 AM because patients will be seen in the order of arrival.  Go to the second floor on arrival and check in.  **Availability is limited; therefore, patients may not be seen on the same day.**

## 2021-11-13 NOTE — ED Provider Notes (Signed)
[facility] EM[facility]GENCY [facility] Provider Note   CSN: [id number] Arrival date & time: 11/13/21  1052     History  Chief Complaint  Patient presents with   Hallucinations    Jeffrey Holland is a 36 y.o. male.  36 yo M with a chief complaints of headache.  He felt like his face was twitching and like his eyes were having abnormal movements.  This happened after he had tried to go to bed last night.  He feels like his symptoms have almost completely resolved now.  He has not had a problem like this before.  Further history obtained by mom.  She tells me that he has been hallucinating and increasingly paranoid.  She is concerned because he has been making statements like someone is trying to give him a lethal injection and have been monitoring his thoughts.  The history is provided by the patient.  Illness Severity:  Moderate Onset quality:  Gradual Duration:  2 days Timing:  Constant Progression:  Worsening Chronicity:  New Associated symptoms: headaches   Associated symptoms: no abdominal pain, no chest pain, no congestion, no diarrhea, no fever, no myalgias, no rash, no shortness of breath and no vomiting       Home Medications Prior to Admission medications   Medication Sig Start Date End Date Taking? Authorizing Provider  famotidine (PEPCID) 20 MG tablet Take 1 tablet (20 mg total) by mouth 2 (two) times daily. Patient not taking: Reported on 01/06/2016 06/23/14   Lurene ShadowPhelps, Erin O, PA-C  lansoprazole (PREVACID) 30 MG capsule Take 1 capsule (30 mg total) by mouth daily at 12 noon. Patient not taking: Reported on 08/19/2019 01/06/16   Loren RacerYelverton, David, MD  ondansetron (ZOFRAN) 4 MG tablet Take 1 tablet (4 mg total) by mouth every 8 (eight) hours as needed for nausea or vomiting. Patient not taking: Reported on 08/19/2019 01/06/16   Loren RacerYelverton, David, MD  sucralfate (CARAFATE) 1 g tablet Take 1 tablet (1 g total) by mouth 3 (three) times daily as needed (epigastric  pain). Patient not taking: Reported on 08/19/2019 01/06/16   Loren RacerYelverton, David, MD      Allergies    Patient has no known allergies.    Review of Systems   Review of Systems  Constitutional:  Negative for chills and fever.  HENT:  Negative for congestion and facial swelling.   Eyes:  Negative for discharge and visual disturbance.  Respiratory:  Negative for shortness of breath.   Cardiovascular:  Negative for chest pain and palpitations.  Gastrointestinal:  Negative for abdominal pain, diarrhea and vomiting.  Musculoskeletal:  Negative for arthralgias and myalgias.  Skin:  Negative for color change and rash.  Neurological:  Positive for headaches. Negative for tremors and syncope.  Psychiatric/Behavioral:  Negative for confusion and dysphoric mood.    Physical Exam Updated Vital Signs BP (!) 147/90    Pulse (!) 117    Temp 99 F (37.2 C) (Oral)    Resp 16    SpO2 100%  Physical Exam Vitals and nursing note reviewed.  Constitutional:      Appearance: He is well-developed.  HENT:     Head: Normocephalic and atraumatic.  Eyes:     Pupils: Pupils are equal, round, and reactive to light.  Neck:     Vascular: No JVD.  Cardiovascular:     Rate and Rhythm: Normal rate and regular rhythm.     Heart sounds: No murmur heard.   No friction rub. No gallop.  Pulmonary:     Effort: No respiratory distress.     Breath sounds: No wheezing.  Abdominal:     General: There is no distension.     Tenderness: There is no abdominal tenderness. There is no guarding or rebound.  Musculoskeletal:        General: Normal range of motion.     Cervical back: Normal range of motion and neck supple.  Skin:    Coloration: Skin is not pale.     Findings: No rash.  Neurological:     Mental Status: He is alert and oriented to person, place, and time.  Psychiatric:        Behavior: Behavior normal.    ED Results / Procedures / Treatments   Labs (all labs ordered are listed, but only abnormal results  are displayed) Labs Reviewed  COMPREHENSIVE METABOLIC PANEL - Abnormal; Notable for the following components:      Result Value   Glucose, Bld 130 (*)    AST 50 (*)    ALT 83 (*)    All other components within normal limits  SALICYLATE LEVEL - Abnormal; Notable for the following components:   Salicylate Lvl [id number] (*)    All other components within normal limits  CBC - Abnormal; Notable for the following components:   WBC 11.7 (*)    All other components within normal limits  RESP PANEL BY RT-PCR (FLU A&B, COVID) ARPGX2  ETHANOL  ACETAMINOPHEN LEVEL  RAPID URINE DRUG SCREEN, HOSP PERFORMED    EKG None  Radiology CT Head Wo Contrast  Result Date: 11/13/2021 CLINICAL DATA:  Headache.  Hallucinations. EXAM: CT HEAD WITHOUT CONTRAST TECHNIQUE: Contiguous axial images were obtained from the base of the skull through the vertex without intravenous contrast. COMPARISON:  Head CT 07/15/2016 FINDINGS: Brain: There is no evidence of an acute infarct, intracranial hemorrhage, mass, midline shift, or extra-axial fluid collection. The ventricles and sulci are normal. Vascular: No hyperdense vessel. Skull: No acute fracture or suspicious osseous lesion. Sinuses/Orbits: Visualized paranasal sinuses and mastoid air cells are clear. Visualized orbits are unremarkable. Other: None. IMPRESSION: Negative head CT. Electronically Signed   By: Logan Bores M.D.   On: 11/13/2021 18:02    Procedures Procedures    Medications Ordered in ED Medications  nicotine (NICODERM CQ - dosed in mg/24 hr) patch 7 mg (7 mg Transdermal Patch Applied 11/13/21 1420)    ED Course/ Medical Decision Making/ A&P                           Medical Decision Making 36 yo M with a chief complaints of headache and feeling like his eyes were twitching back-and-forth in his head.  This occurred last night.  He was just seen by psych yesterday and there is some concern for drug abuse.  He tells me has not used in some time and denies  that is a possibility.  He is feeling a bit better now.  Has no obvious neurologic deficit on my exam.  We will obtain a CT head to evaluate for possible intracranial pathology though I feel like this is probably unlikely.  I reviewed his blood work which is unremarkable.  No significant electrolyte abnormality.  He was tachycardic on arrival but this resolved by my exam.  I did get further history from the patient's mother who endorses that he has been hallucinating recently.  Increasingly paranoid.  I did review his note from behavioral health  urgent care yesterday.  At that time there was no documentation of hallucinations.    CT scan of the head is negative.  I feel the patient is medically clear for TTS evaluation.           Final Clinical Impression(s) / ED Diagnoses Final diagnoses:  Hallucinations    Rx / DC Orders ED Discharge Orders     None         Deno Etienne, DO 11/13/21 [place]

## 2021-11-13 NOTE — ED Triage Notes (Signed)
Patient coming from home, complaint of hallucinations that came on the past few days. Mother states this is new for patient.

## 2021-11-13 NOTE — ED Notes (Signed)
Pt called x3 no response this is second time calling x3 for v/s update and no answer
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
## 2021-11-14 ENCOUNTER — Other Ambulatory Visit: Payer: Self-pay

## 2021-11-14 ENCOUNTER — Emergency Department (HOSPITAL_COMMUNITY)
Admission: EM | Admit: 2021-11-14 | Discharge: 2021-11-14 | Disposition: A | Discharge status: Other Acute Inpatient Hospital | Payer: Self-pay | Attending: Emergency Medicine | Admitting: Emergency Medicine

## 2021-11-14 ENCOUNTER — Encounter (HOSPITAL_COMMUNITY): Payer: Self-pay | Admitting: Emergency Medicine

## 2021-11-14 DIAGNOSIS — Z20822 Contact with and (suspected) exposure to covid-19: Secondary | ICD-10-CM | POA: Insufficient documentation

## 2021-11-14 DIAGNOSIS — R443 Hallucinations, unspecified: Secondary | ICD-10-CM

## 2021-11-14 DIAGNOSIS — Y900 Blood alcohol level of less than 20 mg/100 ml: Secondary | ICD-10-CM | POA: Insufficient documentation

## 2021-11-14 DIAGNOSIS — Z79899 Other long term (current) drug therapy: Secondary | ICD-10-CM | POA: Insufficient documentation

## 2021-11-14 DIAGNOSIS — F29 Unspecified psychosis not due to a substance or known physiological condition: Secondary | ICD-10-CM | POA: Insufficient documentation

## 2021-11-14 LAB — CBC WITH DIFFERENTIAL/PLATELET
Abs Immature Granulocytes: 0.04 10*3/uL (ref 0.00–0.07)
Basophils Absolute: 0.1 10*3/uL (ref 0.0–0.1)
Basophils Relative: 1 %
Eosinophils Absolute: 0.1 10*3/uL (ref 0.0–0.5)
Eosinophils Relative: 1 %
HCT: 43.3 % (ref 39.0–52.0)
Hemoglobin: 14.7 g/dL (ref 13.0–17.0)
Immature Granulocytes: 0 %
Lymphocytes Relative: 33 %
Lymphs Abs: 4.3 10*3/uL — ABNORMAL HIGH (ref 0.7–4.0)
MCH: 34.3 pg — ABNORMAL HIGH (ref 26.0–34.0)
MCHC: 33.9 g/dL (ref 30.0–36.0)
MCV: 100.9 fL — ABNORMAL HIGH (ref 80.0–100.0)
Monocytes Absolute: 0.9 10*3/uL (ref 0.1–1.0)
Monocytes Relative: 7 %
Neutro Abs: 7.7 10*3/uL (ref 1.7–7.7)
Neutrophils Relative %: 58 %
Platelets: 262 10*3/uL (ref 150–400)
RBC: 4.29 MIL/uL (ref 4.22–5.81)
RDW: 12.8 % (ref 11.5–15.5)
WBC: 13.1 10*3/uL — ABNORMAL HIGH (ref 4.0–10.5)
nRBC: 0 % (ref 0.0–0.2)

## 2021-11-14 LAB — RAPID URINE DRUG SCREEN, HOSP PERFORMED
Amphetamines: NOT DETECTED
Barbiturates: NOT DETECTED
Benzodiazepines: NOT DETECTED
Cocaine: NOT DETECTED
Opiates: NOT DETECTED
Tetrahydrocannabinol: POSITIVE — AB

## 2021-11-14 LAB — BASIC METABOLIC PANEL
Anion gap: 8 (ref 5–15)
BUN: 18 mg/dL (ref 6–20)
CO2: 26 mmol/L (ref 22–32)
Calcium: 8.9 mg/dL (ref 8.9–10.3)
Chloride: 103 mmol/L (ref 98–111)
Creatinine, Ser: 0.99 mg/dL (ref 0.61–1.24)
GFR, Estimated: >60 mL/min (ref >60)
Glucose, Bld: 123 mg/dL — ABNORMAL HIGH (ref 70–99)
Potassium: 4.6 mmol/L (ref 3.5–5.1)
Sodium: 137 mmol/L (ref 135–145)

## 2021-11-14 LAB — RESP PANEL BY RT-PCR (FLU A&B, COVID) ARPGX2
Influenza A by PCR: NEGATIVE
Influenza B by PCR: NEGATIVE
SARS Coronavirus 2 by RT PCR: NEGATIVE

## 2021-11-14 LAB — SALICYLATE LEVEL: Salicylate Lvl: <7 mg/dL — ABNORMAL LOW (ref 7.0–30.0)

## 2021-11-14 LAB — ACETAMINOPHEN LEVEL: Acetaminophen (Tylenol), Serum: 18 ug/mL (ref 10–30)

## 2021-11-14 LAB — ETHANOL: Alcohol, Ethyl (B): <10 mg/dL

## 2021-11-14 MED ORDER — HALOPERIDOL 5 MG PO TABS
5.0000 mg | ORAL_TABLET | Freq: Once | ORAL | Status: AC
  Administered 2021-11-14: 5 mg via ORAL
  Filled 2021-11-14: qty 1

## 2021-11-14 MED ORDER — LORAZEPAM 1 MG PO TABS
1.0000 mg | ORAL_TABLET | Freq: Once | ORAL | Status: AC
  Administered 2021-11-14: 1 mg via ORAL
  Filled 2021-11-14: qty 1

## 2021-11-14 MED ORDER — NICOTINE 21 MG/24HR TD PT24
21.0000 mg | MEDICATED_PATCH | Freq: Every day | TRANSDERMAL | Status: DC
  Administered 2021-11-14 (×2): 21 mg via TRANSDERMAL
  Filled 2021-11-14 (×2): qty 1
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
## 2021-11-14 NOTE — ED Notes (Signed)
Patient awake and tearful, anxious. MD notified.

## 2021-11-14 NOTE — ED Notes (Signed)
Patient very emotional, crying and pacing in triage.  Patient to be medicated per new orders.

## 2021-11-14 NOTE — ED Notes (Signed)
Pt is sleeping at this time.

## 2021-11-14 NOTE — ED Notes (Signed)
Patient on phone with mother 

## 2021-11-14 NOTE — Progress Notes (Addendum)
Pt was accepted to [facility] 11/14/21; Bed Assignment 504-1  Pt meets inpatient criteria per Margorie John, PA-C  Attending Physician will be Dr. Nelda Marseille  Report can be called to: -Adult unit: [phone]  [place] advised IV paperwork to be faxed to [phone]  Care Team notified via secure chat: Emerson Monte, RN, Christina Hussami, LCSW, Cletis Athens, [place], [facility] Wynonia Hazard, RN, Vonzella Nipple, RN, Burnett Corrente, RN, Margorie John, PA-C, Shella Maxim, Kendyl Pearson,NT, and [employer], Therapist, sports.  Nadara Mode, LCSWA 11/14/2021 @ 10:12 PM

## 2021-11-14 NOTE — ED Provider Notes (Addendum)
[facility] EMERGENCY DEPARTMENT Provider Note   CSN: [id number] Arrival date & time: 11/14/21  0923     History  Chief Complaint  Patient presents with   Hallucinations    Jeffrey Holland is a 36 y.o. male presents to the ED for evaluation of hallucinations. The patient reports that he is "going to get the lethal injection for using methamphetamines" and that "the judge in the cameras" told him that. The patient denies any bodily complaints and denies experiencing any pain.  He does endorse methamphetamine IV use.  Per the triage note, the patient was brought in voluntarily by [facility] office for auditory hallucinations.  Level 5 caveat due to the patient's AMS and psychiatric condition.   The history is provided by the patient. The history is limited by the condition of the patient. No language interpreter was used.      Home Medications Prior to Admission medications   Medication Sig Start Date End Date Taking? Authorizing Provider  famotidine (PEPCID) 20 MG tablet Take 1 tablet (20 mg total) by mouth 2 (two) times daily. Patient not taking: Reported on 01/06/2016 06/23/14   Lurene Shadow, PA-C  lansoprazole (PREVACID) 30 MG capsule Take 1 capsule (30 mg total) by mouth daily at 12 noon. Patient not taking: Reported on 08/19/2019 01/06/16   Loren Racer, MD  ondansetron (ZOFRAN) 4 MG tablet Take 1 tablet (4 mg total) by mouth every 8 (eight) hours as needed for nausea or vomiting. Patient not taking: Reported on 08/19/2019 01/06/16   Loren Racer, MD  sucralfate (CARAFATE) 1 g tablet Take 1 tablet (1 g total) by mouth 3 (three) times daily as needed (epigastric pain). Patient not taking: Reported on 08/19/2019 01/06/16   Loren Racer, MD      Allergies    Patient has no known allergies.    Review of Systems   Review of Systems  Unable to perform ROS: Psychiatric disorder   Physical Exam Updated Vital Signs BP 113/78    Pulse 85    Temp 97.8  F (36.6 C)    Resp 19    SpO2 98%  Physical Exam Vitals and nursing note reviewed.  Constitutional:      Appearance: He is not ill-appearing.     Comments: Tearful  HENT:     Head: Normocephalic and atraumatic.  Cardiovascular:     Rate and Rhythm: Normal rate and regular rhythm.  Pulmonary:     Effort: Pulmonary effort is normal. No respiratory distress.  Abdominal:     General: Abdomen is flat. There is no distension.  Neurological:     Mental Status: He is alert.     Comments: Oriented to place and person, but not time.    ED Results / Procedures / Treatments   Labs (all labs ordered are listed, but only abnormal results are displayed) Labs Reviewed  SALICYLATE LEVEL - Abnormal; Notable for the following components:      Result Value   Salicylate Lvl <7.0 (*)    All other components within normal limits  BASIC METABOLIC PANEL - Abnormal; Notable for the following components:   Glucose, Bld 123 (*)    All other components within normal limits  CBC WITH DIFFERENTIAL/PLATELET - Abnormal; Notable for the following components:   WBC 13.1 (*)    MCV 100.9 (*)    MCH 34.3 (*)    Lymphs Abs 4.3 (*)    All other components within normal limits  RAPID URINE  DRUG SCREEN, HOSP PERFORMED - Abnormal; Notable for the following components:   Tetrahydrocannabinol POSITIVE (*)    All other components within normal limits  RESP PANEL BY RT-PCR (FLU A&B, COVID) ARPGX2  ACETAMINOPHEN LEVEL  ETHANOL    EKG None  Radiology CT Head Wo Contrast  Result Date: 11/13/2021 CLINICAL DATA:  Headache.  Hallucinations. EXAM: CT HEAD WITHOUT CONTRAST TECHNIQUE: Contiguous axial images were obtained from the base of the skull through the vertex without intravenous contrast. COMPARISON:  Head CT 07/15/2016 FINDINGS: Brain: There is no evidence of an acute infarct, intracranial hemorrhage, mass, midline shift, or extra-axial fluid collection. The ventricles and sulci are normal. Vascular: No  hyperdense vessel. Skull: No acute fracture or suspicious osseous lesion. Sinuses/Orbits: Visualized paranasal sinuses and mastoid air cells are clear. Visualized orbits are unremarkable. Other: None. IMPRESSION: Negative head CT. Electronically Signed   By: Sebastian Ache M.D.   On: 11/13/2021 18:02    Procedures Procedures    Medications Ordered in ED Medications  nicotine (NICODERM CQ - dosed in mg/24 hours) patch 21 mg (21 mg Transdermal Patch Applied 11/14/21 0559)  haloperidol (HALDOL) tablet 5 mg (5 mg Oral Given 11/14/21 0558)  LORazepam (ATIVAN) tablet 1 mg (1 mg Oral Given 11/14/21 0559)    ED Course/ Medical Decision Making/ A&P Clinical Course as of 11/14/21 0739  Tue Nov 14, 2021  0543 Patient is in room.  He is crying for a priest asking for one to come in, saying he is going to hell.  Laying on the floor.  He requested a nicotine patch.  Will order PO meds, nicotine patch.  [EH]  [phone] Patient is in his room.  He is praying to god, believes we are going to give him a lethal injection.  He attempted to elope once and was brought back to room.  [EH]  8811 Patient up at the sink in his room.  He is using the water from the sink to draw crosses all over his body.  He is redirected and then asking for a priest to come crucify him.  [EH]    Clinical Course User Index [EH] Cristina Gong, PA-C                           Medical Decision Making  36 year old male presents to the emergency department for evaluation of auditory/possible visual hallucinations.  Patient is very tearful on exam, rocking back and forth in his bed.  He is afraid that they are going to give him the lethal injection here.  Patient was recently seen here a few days ago and cleared for TTS.  At that time, head CT was ordered and was negative.  Although he reports IV methamphetamine use, his UDA was only positive for THC.  Get a salicylate level.  Acetaminophen mildly elevated 18, no toxic levels.  BMP shows mildly  elevated glucose at 123, however no electrolyte abnormalities.  Negative for COVID and flu.  Negative for ethanol.  CBC shows a mildly elevated white blood cell count at 13.1, however no elevated neutrophil count.  Nicotine patch, Ativan, and Haldol given in triage.  TTS ordered. IVC in place. First examination completed. Patient is medically clear awaiting TTS.  Final Clinical Impression(s) / ED Diagnoses Final diagnoses:  Hallucinations    Rx / DC Orders ED Discharge Orders     None         Achille Rich, [place] 11/14/21 0315  Achille RichRansom, Lorah Kalina, PA-C 11/14/21 [id number]    Gloris Manchesterixon, [place], MD 11/17/21 [phone]

## 2021-11-14 NOTE — ED Notes (Signed)
Patient given discharge instructions, all questions answered. Officer in possession of all belongings, patient escorted out with officer.

## 2021-11-14 NOTE — ED Notes (Signed)
Pt belongings placed in locker 3 

## 2021-11-14 NOTE — ED Notes (Signed)
Patient on TTS 

## 2021-11-14 NOTE — ED Triage Notes (Signed)
Patient here from home with hearing voices for the last few days.  Patient escorted by [facility]. Patient was seen yesterday for same.

## 2021-11-14 NOTE — ED Provider Notes (Addendum)
Emergency Medicine Provider Triage Evaluation Note  Jeffrey Holland , a 36 y.o. male  was evaluated in triage.  Pt complains of hallucinations.  He came in voluntarily with [facility].  He was seen yesterday, medically cleared for psychiatric evaluation and disposition.  He tells me that for 4 days he has been able to hear everyone's conversation and hear everything going on.  He states that he recently has been talking with his brother, Jeffrey Holland, who is deceased and died at the age of 73.  He denies SI or HI.  Police report that patient's father was reportedly going to take out IVC papers but now one has shown up to do so thus far.   Review of Systems  Positive: See above Negative:  Physical Exam  BP 113/78    Pulse 85    Temp 97.8 F (36.6 C)    Resp 19    SpO2 98%  Gen:   Awake, no distress   Resp:  Normal effort  MSK:   Moves extremities without difficulty  Other:  Normal speech.  Endorses hallucinations, primarily auditory hallucinations.  Medical Decision Making  Medically screening exam initiated at 4:05 AM.  Appropriate orders placed.  Jeffrey Holland was informed that the remainder of the evaluation will be completed by another provider, this initial triage assessment does not replace that evaluation, and the importance of remaining in the ED until their evaluation is complete.  Note: Portions of this report may have been transcribed using voice recognition software. Every effort was made to ensure accuracy; however, inadvertent computerized transcription errors may be present    Jeffrey Holland 11/14/21 0408   Clinical Course as of 11/14/21 0646  Tue Nov 14, 2021  0543 Patient is in room.  He is crying for a priest asking for one to come in, saying he is going to hell.  Laying on the floor.  He requested a nicotine patch.  Will order PO meds, nicotine patch.  [EH]  [phone] Patient is in his room.  He is praying to god, believes we are going to give  him a lethal injection.  He attempted to elope once and was brought back to room.  [EH]  [id number] Patient up at the sink in his room.  He is using the water from the sink to draw crosses all over his body.  He is redirected and then asking for a priest to come crucify him.  [EH]    Clinical Course User Index [EH] Lorin Glass, PA-C    Sheriffs arrived with IVC orders.    Lorin Glass, [place] 11/14/21 [id number]    Merryl Hacker, MD 11/14/21 [phone]

## 2021-11-14 NOTE — ED Notes (Signed)
Report called to Kathlene November, RN at [facility]. Arranging transport via [employer]

## 2021-11-15 ENCOUNTER — Inpatient Hospital Stay (HOSPITAL_COMMUNITY)
Admission: AD | Admit: 2021-11-15 | Discharge: 2021-12-04 | DRG: 885 | Disposition: A | Discharge status: Home / Self Care | Payer: Federal, State, Local not specified - Other | Attending: Emergency Medicine | Admitting: Emergency Medicine

## 2021-11-15 ENCOUNTER — Encounter (HOSPITAL_COMMUNITY): Payer: Self-pay | Admitting: Student

## 2021-11-15 ENCOUNTER — Encounter (HOSPITAL_COMMUNITY): Payer: Self-pay

## 2021-11-15 DIAGNOSIS — F1010 Alcohol abuse, uncomplicated: Secondary | ICD-10-CM | POA: Diagnosis present

## 2021-11-15 DIAGNOSIS — Z8711 Personal history of peptic ulcer disease: Secondary | ICD-10-CM | POA: Diagnosis not present

## 2021-11-15 DIAGNOSIS — F29 Unspecified psychosis not due to a substance or known physiological condition: Secondary | ICD-10-CM | POA: Diagnosis present

## 2021-11-15 DIAGNOSIS — F1210 Cannabis abuse, uncomplicated: Secondary | ICD-10-CM | POA: Diagnosis present

## 2021-11-15 DIAGNOSIS — Z818 Family history of other mental and behavioral disorders: Secondary | ICD-10-CM

## 2021-11-15 DIAGNOSIS — Z20822 Contact with and (suspected) exposure to covid-19: Secondary | ICD-10-CM | POA: Diagnosis present

## 2021-11-15 DIAGNOSIS — F17210 Nicotine dependence, cigarettes, uncomplicated: Secondary | ICD-10-CM | POA: Diagnosis present

## 2021-11-15 DIAGNOSIS — B1920 Unspecified viral hepatitis C without hepatic coma: Secondary | ICD-10-CM | POA: Diagnosis present

## 2021-11-15 DIAGNOSIS — F1410 Cocaine abuse, uncomplicated: Secondary | ICD-10-CM | POA: Diagnosis present

## 2021-11-15 HISTORY — DX: Unspecified asthma, uncomplicated: J45.909

## 2021-11-15 LAB — HIV ANTIBODY (ROUTINE TESTING W REFLEX): HIV Screen 4th Generation wRfx: NONREACTIVE

## 2021-11-15 LAB — VITAMIN B12: Vitamin B-12: 348 pg/mL (ref 180–914)

## 2021-11-15 LAB — HEPATITIS PANEL, ACUTE
HCV Ab: REACTIVE — AB
Hep A IgM: NONREACTIVE
Hep B C IgM: NONREACTIVE
Hepatitis B Surface Ag: NONREACTIVE

## 2021-11-15 LAB — HEMOGLOBIN A1C
Hgb A1c MFr Bld: 5.5 % (ref 4.8–5.6)
Mean Plasma Glucose: 111.15 mg/dL

## 2021-11-15 LAB — LIPID PANEL
Cholesterol: 151 mg/dL (ref 0–200)
HDL: 42 mg/dL (ref >40)
LDL Cholesterol: 89 mg/dL (ref 0–99)
Total CHOL/HDL Ratio: 3.6 RATIO
Triglycerides: 98 mg/dL (ref <150)
VLDL: 20 mg/dL (ref 0–40)

## 2021-11-15 LAB — SEDIMENTATION RATE: Sed Rate: 2 mm/hr (ref 0–16)

## 2021-11-15 LAB — TSH: TSH: 0.667 u[IU]/mL (ref 0.350–4.500)

## 2021-11-15 MED ORDER — THIAMINE HCL 100 MG PO TABS
100.0000 mg | ORAL_TABLET | Freq: Every day | ORAL | Status: DC
  Administered 2021-11-16 – 2021-12-04 (×19): 100 mg via ORAL
  Filled 2021-11-15 (×22): qty 1

## 2021-11-15 MED ORDER — HYDROXYZINE HCL 25 MG PO TABS
25.0000 mg | ORAL_TABLET | Freq: Four times a day (QID) | ORAL | Status: AC | PRN
  Administered 2021-11-15 – 2021-11-17 (×3): 25 mg via ORAL
  Filled 2021-11-15 (×3): qty 1

## 2021-11-15 MED ORDER — LOPERAMIDE HCL 2 MG PO CAPS: 2.0000 mg | ORAL_CAPSULE | ORAL | Status: AC | PRN

## 2021-11-15 MED ORDER — MAGNESIUM HYDROXIDE 400 MG/5ML PO SUSP: 30.0000 mL | Freq: Every day | ORAL | Status: DC | PRN

## 2021-11-15 MED ORDER — LORAZEPAM 1 MG PO TABS
ORAL_TABLET | ORAL | Status: AC
  Filled 2021-11-15: qty 2

## 2021-11-15 MED ORDER — FAMOTIDINE 20 MG PO TABS
20.0000 mg | ORAL_TABLET | Freq: Two times a day (BID) | ORAL | Status: DC
  Administered 2021-11-15 – 2021-12-04 (×38): 20 mg via ORAL
  Filled 2021-11-15 (×41): qty 1

## 2021-11-15 MED ORDER — LORAZEPAM 1 MG PO TABS
2.0000 mg | ORAL_TABLET | Freq: Three times a day (TID) | ORAL | Status: DC | PRN
  Administered 2021-11-15: 2 mg via ORAL

## 2021-11-15 MED ORDER — LORAZEPAM 1 MG PO TABS
1.0000 mg | ORAL_TABLET | Freq: Four times a day (QID) | ORAL | Status: AC | PRN
  Administered 2021-11-17: 1 mg via ORAL

## 2021-11-15 MED ORDER — ALUM & MAG HYDROXIDE-SIMETH 200-200-20 MG/5ML PO SUSP: 30.0000 mL | ORAL | Status: DC | PRN

## 2021-11-15 MED ORDER — HYDROXYZINE HCL 25 MG PO TABS
25.0000 mg | ORAL_TABLET | Freq: Three times a day (TID) | ORAL | Status: DC | PRN
  Administered 2021-11-15: 25 mg via ORAL

## 2021-11-15 MED ORDER — HYDROXYZINE HCL 25 MG PO TABS
ORAL_TABLET | ORAL | Status: AC
  Filled 2021-11-15: qty 1

## 2021-11-15 MED ORDER — ADULT MULTIVITAMIN W/MINERALS CH
1.0000 | ORAL_TABLET | Freq: Every day | ORAL | Status: DC
  Administered 2021-11-15 – 2021-12-04 (×20): 1 via ORAL
  Filled 2021-11-15 (×23): qty 1

## 2021-11-15 MED ORDER — OLANZAPINE 5 MG PO TABS
5.0000 mg | ORAL_TABLET | Freq: Every day | ORAL | Status: DC
  Administered 2021-11-15: 5 mg via ORAL
  Filled 2021-11-15 (×2): qty 1

## 2021-11-15 MED ORDER — HALOPERIDOL LACTATE 5 MG/ML IJ SOLN
5.0000 mg | Freq: Three times a day (TID) | INTRAMUSCULAR | Status: DC | PRN
  Administered 2021-11-19: 5 mg via INTRAMUSCULAR
  Filled 2021-11-15: qty 1

## 2021-11-15 MED ORDER — DIPHENHYDRAMINE HCL 25 MG PO CAPS
50.0000 mg | ORAL_CAPSULE | Freq: Three times a day (TID) | ORAL | Status: DC | PRN
  Administered 2021-11-18 – 2021-11-20 (×3): 50 mg via ORAL
  Filled 2021-11-15 (×4): qty 2

## 2021-11-15 MED ORDER — ONDANSETRON 4 MG PO TBDP: 4.0000 mg | ORAL_TABLET | Freq: Four times a day (QID) | ORAL | Status: AC | PRN

## 2021-11-15 MED ORDER — LORAZEPAM 2 MG/ML IJ SOLN
2.0000 mg | Freq: Three times a day (TID) | INTRAMUSCULAR | Status: DC | PRN
  Administered 2021-11-19: 2 mg via INTRAMUSCULAR
  Filled 2021-11-15: qty 1

## 2021-11-15 MED ORDER — LORAZEPAM 2 MG/ML IJ SOLN: 2.0000 mg | Freq: Three times a day (TID) | INTRAMUSCULAR | Status: DC | PRN

## 2021-11-15 MED ORDER — TRAZODONE HCL 50 MG PO TABS
ORAL_TABLET | ORAL | Status: AC
  Filled 2021-11-15: qty 1

## 2021-11-15 MED ORDER — TRAZODONE HCL 50 MG PO TABS
50.0000 mg | ORAL_TABLET | Freq: Every evening | ORAL | Status: DC | PRN
  Administered 2021-11-15 – 2021-11-18 (×5): 50 mg via ORAL
  Filled 2021-11-15 (×4): qty 1

## 2021-11-15 MED ORDER — NICOTINE POLACRILEX 2 MG MT GUM
2.0000 mg | CHEWING_GUM | OROMUCOSAL | Status: DC | PRN
  Administered 2021-11-16 – 2021-11-24 (×6): 2 mg via ORAL
  Filled 2021-11-15 (×6): qty 1

## 2021-11-15 MED ORDER — NICOTINE 21 MG/24HR TD PT24
21.0000 mg | MEDICATED_PATCH | Freq: Every day | TRANSDERMAL | Status: DC
  Filled 2021-11-15 (×2): qty 1

## 2021-11-15 MED ORDER — DIPHENHYDRAMINE HCL 50 MG/ML IJ SOLN
50.0000 mg | Freq: Three times a day (TID) | INTRAMUSCULAR | Status: DC | PRN
  Administered 2021-11-19: 50 mg via INTRAMUSCULAR
  Filled 2021-11-15: qty 1

## 2021-11-15 MED ORDER — ACETAMINOPHEN 325 MG PO TABS
650.0000 mg | ORAL_TABLET | Freq: Four times a day (QID) | ORAL | Status: DC | PRN
  Administered 2021-11-22 – 2021-11-23 (×2): 650 mg via ORAL
  Filled 2021-11-15 (×2): qty 2

## 2021-11-15 MED ORDER — BENZTROPINE MESYLATE 0.5 MG PO TABS
0.5000 mg | ORAL_TABLET | Freq: Two times a day (BID) | ORAL | Status: DC | PRN
  Filled 2021-11-15: qty 14

## 2021-11-15 MED ORDER — LORAZEPAM 1 MG PO TABS
1.0000 mg | ORAL_TABLET | Freq: Three times a day (TID) | ORAL | Status: DC | PRN
  Administered 2021-11-18 – 2021-11-19 (×2): 1 mg via ORAL
  Filled 2021-11-15 (×3): qty 1

## 2021-11-15 MED ORDER — NICOTINE 14 MG/24HR TD PT24
14.0000 mg | MEDICATED_PATCH | Freq: Every day | TRANSDERMAL | Status: DC
  Administered 2021-11-15: 14 mg via TRANSDERMAL
  Filled 2021-11-15 (×3): qty 1

## 2021-11-15 MED ORDER — HALOPERIDOL 5 MG PO TABS
5.0000 mg | ORAL_TABLET | Freq: Three times a day (TID) | ORAL | Status: DC | PRN
  Administered 2021-11-18 (×2): 5 mg via ORAL
  Filled 2021-11-15 (×2): qty 1
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
## 2021-11-15 NOTE — Progress Notes (Addendum)
Pt stated he did not slept much in the past few days and pt was agitated. Pt was given PRN Trazodone, Ativan, and Vistaril per [facility]

## 2021-11-15 NOTE — Tx Team (Signed)
Initial Treatment Plan 11/15/2021 1:45 AM Jeffrey Holland [id number]    PATIENT STRESSORS: Marital or family conflict   Medication change or noncompliance     PATIENT STRENGTHS: General fund of knowledge  Motivation for treatment/growth    PATIENT IDENTIFIED PROBLEMS: psychosis  delusions  "I think I need heavy medications, find out what's going on"                 DISCHARGE CRITERIA:  Improved stabilization in mood, thinking, and/or behavior Verbal commitment to aftercare and medication compliance  PRELIMINARY DISCHARGE PLAN: Attend aftercare/continuing care group Attend PHP/IOP Outpatient therapy  PATIENT/FAMILY INVOLVEMENT: This treatment plan has been presented to and reviewed with the patient, Jeffrey Holland.  The patient and family have been given the opportunity to ask questions and make suggestions.  Providence Crosby, RN 11/15/2021, 1:45 AM

## 2021-11-15 NOTE — BH IP Treatment Plan (Signed)
Interdisciplinary Treatment and Diagnostic Plan Update  11/15/2021 Time of Session: 9:30am  Jeffrey Holland MRN: [id number]  Principal Diagnosis: Psychosis, unspecified psychosis type [facility])  Secondary Diagnoses: Principal Problem:   Psychosis, unspecified psychosis type (Jeffrey [place]) Active Problems:   Alcohol abuse   Cannabis abuse   Cocaine abuse, episodic use ([place])   Current Medications:  Current Facility-Administered Medications  Medication Dose Route Frequency Provider Last Rate Last Admin   acetaminophen (TYLENOL) tablet 650 mg  650 mg Oral Q6H PRN Prescilla Sours, PA-C       alum & mag hydroxide-simeth (MAALOX/MYLANTA) 200-200-20 MG/5ML suspension 30 mL  30 mL Oral Q4H PRN Margorie John W, PA-C       benztropine (COGENTIN) tablet 0.5 mg  0.5 mg Oral BID PRN Harlow Asa, MD       diphenhydrAMINE (BENADRYL) capsule 50 mg  50 mg Oral Q8H PRN Margorie John W, PA-C       Or   diphenhydrAMINE (BENADRYL) injection 50 mg  50 mg Intramuscular Q8H PRN Lovena Le, [name], PA-C       famotidine (PEPCID) tablet 20 mg  20 mg Oral BID Damita Dunnings B, MD       haloperidol (HALDOL) tablet 5 mg  5 mg Oral Q8H PRN Margorie John W, PA-C       Or   haloperidol lactate (HALDOL) injection 5 mg  5 mg Intramuscular Q8H PRN Lovena Le, [name], PA-C       hydrOXYzine (ATARAX) tablet 25 mg  25 mg Oral Q6H PRN Nelda Marseille, Amy E, MD       loperamide (IMODIUM) capsule 2-4 mg  2-4 mg Oral PRN Harlow Asa, MD       LORazepam (ATIVAN) tablet 2 mg  2 mg Oral Q8H PRN Prescilla Sours, PA-C   2 mg at 11/15/21 5573   Or   LORazepam (ATIVAN) injection 2 mg  2 mg Intramuscular Q8H PRN Lovena Le, [name], PA-C       LORazepam (ATIVAN) tablet 1 mg  1 mg Oral Q6H PRN Nelda Marseille, Amy E, MD       magnesium hydroxide (MILK OF MAGNESIA) suspension 30 mL  30 mL Oral Daily PRN Margorie John W, PA-C       multivitamin with minerals tablet 1 tablet  1 tablet Oral Daily Nelda Marseille, Amy E, MD   1 tablet at 11/15/21 1407   [START ON 11/16/2021]  nicotine (NICODERM CQ - dosed in mg/24 hours) patch 21 mg  21 mg Transdermal Daily Damita Dunnings B, MD       OLANZapine (ZYPREXA) tablet 5 mg  5 mg Oral QHS Damita Dunnings B, MD       ondansetron (ZOFRAN-ODT) disintegrating tablet 4 mg  4 mg Oral Q6H PRN Harlow Asa, MD       [START ON 11/16/2021] thiamine tablet 100 mg  100 mg Oral Daily Nelda Marseille, Amy E, MD       traZODone (DESYREL) tablet 50 mg  50 mg Oral QHS PRN Margorie John W, PA-C   50 mg at 11/15/21 0137   PTA Medications: Medications Prior to Admission  Medication Sig Dispense Refill Last Dose   famotidine (PEPCID) 20 MG tablet Take 1 tablet (20 mg total) by mouth 2 (two) times daily. (Patient not taking: Reported on 01/06/2016) 30 tablet 0    lansoprazole (PREVACID) 30 MG capsule Take 1 capsule (30 mg total) by mouth daily at 12 noon. (Patient not taking: Reported on 08/19/2019) 30 capsule 0  ondansetron (ZOFRAN) 4 MG tablet Take 1 tablet (4 mg total) by mouth every 8 (eight) hours as needed for nausea or vomiting. (Patient not taking: Reported on 08/19/2019) 12 tablet 0    sucralfate (CARAFATE) 1 g tablet Take 1 tablet (1 g total) by mouth 3 (three) times daily as needed (epigastric pain). (Patient not taking: Reported on 08/19/2019) 30 tablet 0     Patient Stressors: Marital or family conflict   Medication change or noncompliance    Patient Strengths: Psychologist, clinical for treatment/growth   Treatment Modalities: Medication Management, Group therapy, Case management,  1 to 1 session with clinician, Psychoeducation, Recreational therapy.   Physician Treatment Plan for Primary Diagnosis: Psychosis, unspecified psychosis type (Jeffrey Holland) Long Term Goal(s): Improvement in symptoms so as ready for discharge   Short Term Goals: Ability to identify changes in lifestyle to reduce recurrence of condition will improve Ability to verbalize feelings will improve Ability to disclose and discuss suicidal ideas Ability to  identify triggers associated with substance abuse/mental health issues will improve  Medication Management: Evaluate patient's response, side effects, and tolerance of medication regimen.  Therapeutic Interventions: 1 to 1 sessions, Unit Group sessions and Medication administration.  Evaluation of Outcomes: Not Met  Physician Treatment Plan for Secondary Diagnosis: Principal Problem:   Psychosis, unspecified psychosis type (Jeffrey Holland) Active Problems:   Alcohol abuse   Cannabis abuse   Cocaine abuse, episodic use (Jeffrey Holland)  Long Term Goal(s): Improvement in symptoms so as ready for discharge   Short Term Goals: Ability to identify changes in lifestyle to reduce recurrence of condition will improve Ability to verbalize feelings will improve Ability to disclose and discuss suicidal ideas Ability to identify triggers associated with substance abuse/mental health issues will improve     Medication Management: Evaluate patient's response, side effects, and tolerance of medication regimen.  Therapeutic Interventions: 1 to 1 sessions, Unit Group sessions and Medication administration.  Evaluation of Outcomes: Not Met   RN Treatment Plan for Primary Diagnosis: Psychosis, unspecified psychosis type (Jeffrey Holland) Long Term Goal(s): Knowledge of disease and therapeutic regimen to maintain health will improve  Short Term Goals: Ability to remain free from injury will improve, Ability to participate in decision making will improve, Ability to verbalize feelings will improve, Ability to disclose and discuss suicidal ideas, and Ability to identify and develop effective coping behaviors will improve  Medication Management: RN will administer medications as ordered by provider, will assess and evaluate patient's response and provide education to patient for prescribed medication. RN will report any adverse and/or side effects to prescribing provider.  Therapeutic Interventions: 1 on 1 counseling sessions,  Psychoeducation, Medication administration, Evaluate responses to treatment, Monitor vital signs and CBGs as ordered, Perform/monitor CIWA, COWS, AIMS and Fall Risk screenings as ordered, Perform wound care treatments as ordered.  Evaluation of Outcomes: Not Met   LCSW Treatment Plan for Primary Diagnosis: Psychosis, unspecified psychosis type ([place]) Long Term Goal(s): Safe transition to appropriate next level of care at discharge, Engage patient in therapeutic group addressing interpersonal concerns.  Short Term Goals: Engage patient in aftercare planning with referrals and resources, Increase social support, Increase emotional regulation, Facilitate acceptance of mental health diagnosis and concerns, Identify triggers associated with mental health/substance abuse issues, and Increase skills for wellness and recovery  Therapeutic Interventions: Assess for all discharge needs, 1 to 1 time with Social worker, Explore available resources and support systems, Assess for adequacy in community support network, Educate family and significant other(s) on suicide  prevention, Complete Psychosocial Assessment, Interpersonal group therapy.  Evaluation of Outcomes: Not Met   Progress in Treatment: Attending groups: Yes. Participating in groups: Yes. Taking medication as prescribed: Yes. Toleration medication: Yes. Family/Significant other contact made: Yes, individual(s) contacted:  If consents are provided  Patient understands diagnosis: No. Discussing patient identified problems/goals with staff: Yes. Medical problems stabilized or resolved: Yes. Denies suicidal/homicidal ideation: Yes. Issues/concerns per patient self-inventory: No.   Jeffrey problem(s) identified: No, Describe:  None   Jeffrey Short Term/Long Term Goal(s): medication stabilization, elimination of SI thoughts, development of comprehensive mental wellness plan.   Patient Goals: "To stop all bad thoughts and look forward to the future"    Discharge Plan or Barriers: Patient recently admitted. CSW will continue to follow and assess for appropriate referrals and possible discharge planning.   Reason for Continuation of Hospitalization: Delusions  Hallucinations Medication stabilization  Estimated Length of Stay: 3 to 5 days     Scribe for Treatment Team: Darleen Crocker, Latanya Presser 11/15/2021 2:36 PM

## 2021-11-15 NOTE — Group Note (Signed)
LCSW Group Therapy Notes   Date and Time: 11/15/2021 1:00pm  Type of Therapy and Topic: Group Therapy: Worry and Anxiety  Participation Level: B[facility] PARTICIPATION LEVEL: Minimal  Description of Group: In this group, patients will be encouraged to explore their worry around what could happen vs what will happen. Each patient will be challenged to think of personal worries and how they will work their way through that worry around what will happen and what could happen. This group will be process-oriented, with patients participating in exploration of their own experiences as well as giving and receiving support and challenge from other group members.  Therapeutic Goals: Patient will identify personal worries that cause anxiety. Patient will identify clues to identify their worry. Patient will identify ways to handle their worry. Patient will discuss ways that their worry has deceased or why it has not decreased.   Summary of Patient Progress: Pt attended group. Pt shared very personal information in group. Pt mumbled to self throughout group and at one point stood up said a prayer and then knelt to the ground to kiss the floor.    Therapeutic Modalities:  Cognitive Behavioral Therapy Solution Focused Therapy Motivational Interviewing    Ruthann Cancer MSW, LCSW Clincal Social Worker  [facility]
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
## 2021-11-15 NOTE — Plan of Care (Signed)
Progress note  Pt found in bed. Pt compliant with medications once they awoke. Pt presents restless, fidgety, anxious, and disorganized. Pt seems to have difficulty making decisions. Pt states they are still hearing voices and was responding while being assessed. Pt is pleasant. Pt denies si/hi and verbally agrees to approach staff if these become apparent or before harming themselves/others while at [facility].  A: Pt provided support and encouragement. Pt given medication per protocol and standing orders. Q71m safety checks implemented and continued.  R: Pt safe on the unit. Will continue to monitor.  Problem: Education: Goal: Knowledge of [facility] Education information/materials will improve Outcome: Progressing Goal: Emotional status will improve Outcome: Progressing Goal: Mental status will improve Outcome: Progressing

## 2021-11-15 NOTE — Progress Notes (Signed)
Pt kept to himself a lot of the evening, pt continues to present with AVH    11/15/21 2000  Psych Admission Type (Psych Patients Only)  Admission Status Involuntary  Psychosocial Assessment  Patient Complaints Suspiciousness;Restlessness  Eye Contact Fair  Facial Expression Animated;Anxious;Worried  Affect Anxious;Irritable;Preoccupied  Speech Rapid;Tangential  Interaction Assertive  Motor Activity Restless  Appearance/Hygiene Disheveled;In scrubs  Behavior Characteristics Restless;Anxious  Mood Suspicious;Preoccupied;Pleasant  Thought Process  Coherency Circumstantial;Disorganized;Flight of ideas;Loose associations;Tangential  Content Blaming others;Obsessions;Preoccupation;Paranoia  Delusions Paranoid  Perception Hallucinations  Hallucination Auditory;Visual  Judgment Impaired  Confusion Mild  Danger to Self  Current suicidal ideation? Denies  Danger to Others  Danger to Others None reported or observed

## 2021-11-15 NOTE — [facility] Counselor (Signed)
CSW attempted to complete this pt's assessment however, pt was sleeping and could not be woken.   CSW will attempt to complete this pt's assessment at a later time.     Ruthann Cancer MSW, LCSW Clincal Social Worker  [facility]
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
## 2021-11-15 NOTE — Progress Notes (Signed)
°   11/15/21 0500  Sleep  Number of Hours 3

## 2021-11-15 NOTE — Group Note (Signed)
Recreation Therapy Group Note   Group Topic:Other  Group Date: 11/15/2021 Start Time: 0955 End Time: 1030 Facilitators: Caroll Rancher, LRT,CTRS Location: [address] Day[address]   Goal Area(s) Addresses:  Patient will identify what triggers them. Patient will identify how to deal with triggers. Patient will identify how triggers can be managed post d/c.  Group Description: Triggers.  Patients were given a worksheet to identify what triggers were.  Patients then identified what things triggers them.  Patients also identified how to avoid triggers and how to deal with triggers head on. Patients would also express how they would manage triggers post d/c.   Affect/Mood: N/A   Participation Level: Did not attend    Clinical Observations/Individualized Feedback:     Plan: Continue to engage patient in RT group sessions 2-3x/week.   Caroll Rancher, Antonietta Jewel 11/15/2021 12:34 PM

## 2021-11-15 NOTE — Progress Notes (Signed)
Psychoeducational Group Note  Date:  11/15/2021 Time:  2111  Group Topic/Focus:  Wrap-Up Group:   The focus of this group is to help patients review their daily goal of treatment and discuss progress on daily workbooks.  Participation Level: Did Not Attend  Participation Quality:  Not Applicable  Affect:  Not Applicable  Cognitive:  Not Applicable  Insight:  Not Applicable  Engagement in Group: Not Applicable  Additional Comments:  The patient did not attend group this evening.   Archie Balboa S 11/15/2021, 9:11 PM

## 2021-11-16 DIAGNOSIS — F29 Unspecified psychosis not due to a substance or known physiological condition: Secondary | ICD-10-CM | POA: Diagnosis not present

## 2021-11-16 LAB — RPR: RPR Ser Ql: NONREACTIVE

## 2021-11-16 MED ORDER — OLANZAPINE 10 MG PO TABS
10.0000 mg | ORAL_TABLET | Freq: Every day | ORAL | Status: DC
  Administered 2021-11-16 – 2021-11-17 (×2): 10 mg via ORAL
  Filled 2021-11-16 (×3): qty 1

## 2021-11-16 MED ORDER — OLANZAPINE 5 MG PO TABS
5.0000 mg | ORAL_TABLET | Freq: Every day | ORAL | Status: DC
  Administered 2021-11-16 – 2021-11-17 (×2): 5 mg via ORAL
  Filled 2021-11-16 (×3): qty 1

## 2021-11-16 NOTE — Group Note (Signed)
Recreation Therapy Group Note   Group Topic:Communication  Group Date: 11/16/2021 Start Time: 0945 End Time: 1025 Facilitators: Caroll Rancher, LRT,CTRS Location: [address] Dayroom   Goal Area(s) Addresses:  Patient will effectively listen to complete activity.  Patient will identify communication skills used to make activity successful.  Patient will identify how skills used during activity can be used to reach post d/c goals.    Group Description: Geometric Drawings.  Three volunteers from the peer group will be shown an abstract picture with a particular arrangement of geometrical shapes.  Each round, one 'speaker' will describe the pattern, as accurately as possible without revealing the image to the group.  The remaining group members will listen and draw the picture to reflect how it is described to them. Patients with the role of 'listener' cannot ask clarifying questions but, may request that the speaker repeat a direction. Once the drawings are complete, the presenter will show the rest of the group the picture and compare how close each person came to drawing the picture. LRT will facilitate a post-activity discussion regarding effective communication and the importance of planning, listening, and asking for clarification in daily interactions with others.   Affect/Mood: Anxious   Participation Level: Minimal   Participation Quality: Independent   Behavior: Bizarre and Suspicious   Speech/Thought Process: Delusional   Insight: Impaired   Judgement: Impaired   Modes of Intervention: Activity   Patient Response to Interventions:  Resistant    Education Outcome:  Acknowledges education and In group clarification offered    Clinical Observations/Individualized Feedback: Pt was displaying bizarre behavior at one point by getting on his knees and praying.  Pt also appeared to be responding and showing suspicious behavior by looking over his shoulder.  Pt did attempt to try  to participate in activity but was unable to sit still and focus.  Pt spent the remainder of group staring off.     Plan: Continue to engage patient in RT group sessions 2-3x/week.   Caroll Rancher, LRT,CTRS 11/16/2021 11:08 AM

## 2021-11-16 NOTE — Progress Notes (Signed)
Pt was encouraged but didn't attend therapeutic relaxation group. ?

## 2021-11-16 NOTE — Progress Notes (Signed)
°   11/16/21 0500  Sleep  Number of Hours 7

## 2021-11-16 NOTE — Group Note (Signed)
Occupational Therapy Group Note  Group Topic:Feelings Management  Group Date: 11/16/2021 Start Time: 1400 End Time: 1435 Facilitators: Donne Hazel, OT    Group Description: Group encouraged increased engagement and participation through discussion focused on Building Happiness. Patients were provided a handout and reviewed therapeutic strategies to build happiness including identifying gratitudes, random acts of kindness, exercise, meditation, positive journaling, and fostering relationships. Patients engaged in discussion and encouraged to reflect on each strategy and their experiences.  Therapeutic Goal(s): Identify strategies to build happiness. Identify and implement therapeutic strategies to improve overall mood. Practice and identify gratitudes, random acts of kindness, exercise, meditation, positive journaling, and fostering relationships   Participation Level: Did not attend   Plan: Continue to engage patient in OT groups 2 - 3x/week.  11/16/2021  Donne Hazel, OT

## 2021-11-16 NOTE — Progress Notes (Signed)
Psychoeducational Group Note  Date:  11/16/2021 Time:  2014  Group Topic/Focus:  Wrap-Up Group:   The focus of this group is to help patients review their daily goal of treatment and discuss progress on daily workbooks.  Participation Level: Did Not Attend  Participation Quality:  Not Applicable  Affect:  Not Applicable  Cognitive:  Not Applicable  Insight:  Not Applicable  Engagement in Group: Not Applicable  Additional Comments:  The patient did not attend group this evening.   Hazle Coca S 11/16/2021, 8:14 PM

## 2021-11-16 NOTE — Progress Notes (Addendum)
[facility] MD Progress Note  11/16/2021 1:40 PM Jeffrey Holland  MRN:  [id number]  Chief Complaint: hallucinations and paranoia  Subjective:  Jeffrey Holland is a 36 y.o. male with no known past psychiatric history, who was initially admitted for inpatient psychiatric hospitalization on 11/15/2021 for management of hallucinations and delusions. The patient is currently on Hospital Day 1.   Chart Review from last 24 hours:  The patient's chart was reviewed and nursing notes were reviewed. The patient's case was discussed in multidisciplinary team meeting. Per nursing he was anxious, suspicious, and disorganized on the unit. He had no acute behavioral issues or safety concerns noted. Per [facility] he was compliant with scheduled medications and did require Vistaril X1 for anxiety and Trazodone for sleep.  Information Obtained Today During Patient Interview: The patient was seen and evaluated on the unit. On assessment today the patient reports that he believes that the team is going to "kill him."  He states that he "knows that at 7:00 a firing squad is coming after him."  He was provided supportive reassurance that he is in the hospital and that he is safe, but he continues to endorse paranoid thoughts about staff and peers. At one point he expressed belief that MDs could read his mind.  He states he is getting "messages" in his mind from his brother but will not give content.  He states that he has having auditory hallucinations confirming belief that people are after him.  He continues to endorse belief in thought broadcasting and states that he has the ability to "control devices and people" with his "psychic abilities."  He states his sleep and appetite have been good.  He continues to endorse some visual hallucinations of seeing "his brother in hell."  He denies SI or HI.  He denies medication side effects.  He voices no physical complaints today.  On exam he makes multiple hyperreligious statements stating  that he believes he needs to pray and at 1 point during exam gets down on his knees as if in a position for prayer. He denies withdrawal symptoms or cravings for substances.   Principal Problem: Psychosis, unspecified psychosis type ([place]) Diagnosis: Principal Problem:   Psychosis, unspecified psychosis type ([place]) Active Problems:   Alcohol abuse   Cannabis abuse   Cocaine abuse, episodic use ([place])  Total Time Spent in Direct Patient Care:  I personally spent 25 minutes on the unit in direct patient care. The direct patient care time included face-to-face time with the patient, reviewing the patient's chart, communicating with other professionals, and coordinating care. Greater than 50% of this time was spent in counseling or coordinating care with the patient regarding goals of hospitalization, psycho-education, and discharge planning needs.  Past Psychiatric History: See H&P  Past Medical History:  Past Medical History:  Diagnosis Date   Anxiety    Asthma    Panic    Family History: see H&P  Family Psychiatric  History: See H&P  Social History:  Social History   Substance and Sexual Activity  Alcohol Use Yes     Social History   Substance and Sexual Activity  Drug Use Yes   Types: Marijuana, Methamphetamines, Cocaine    Social History   Socioeconomic History   Marital status: Single    Spouse name: Not on file   Number of children: Not on file   Years of education: Not on file   Highest education level: Not on file  Occupational History   Not  on file  Tobacco Use   Smoking status: Every Day    Packs/day: 1.00    Types: Cigarettes   Smokeless tobacco: Not on file  Vaping Use   Vaping Use: Every day  Substance and Sexual Activity   Alcohol use: Yes   Drug use: Yes    Types: Marijuana, Methamphetamines, Cocaine   Sexual activity: Yes    Birth control/protection: None  Other Topics Concern   Not on file  Social History Narrative   ** Merged History  Encounter **       Social Determinants of Health   Financial Resource Strain: Not on file  Food Insecurity: Not on file  Transportation Needs: Not on file  Physical Activity: Not on file  Stress: Not on file  Social Connections: Not on file    Sleep: Good  Appetite:  Good  Current Medications: Current Facility-Administered Medications  Medication Dose Route Frequency Provider Last Rate Last Admin   acetaminophen (TYLENOL) tablet 650 mg  650 mg Oral Q6H PRN Prescilla Sours, PA-C       alum & mag hydroxide-simeth (MAALOX/MYLANTA) 200-200-20 MG/5ML suspension 30 mL  30 mL Oral Q4H PRN Lovena Le, [name], PA-C       benztropine (COGENTIN) tablet 0.5 mg  0.5 mg Oral BID PRN Harlow Asa, MD       diphenhydrAMINE (BENADRYL) capsule 50 mg  50 mg Oral Q8H PRN Margorie John W, PA-C       Or   diphenhydrAMINE (BENADRYL) injection 50 mg  50 mg Intramuscular Q8H PRN Lovena Le, [name], PA-C       famotidine (PEPCID) tablet 20 mg  20 mg Oral BID Damita Dunnings B, MD   20 mg at 11/16/21 0751   haloperidol (HALDOL) tablet 5 mg  5 mg Oral Q8H PRN Prescilla Sours, PA-C       Or   haloperidol lactate (HALDOL) injection 5 mg  5 mg Intramuscular Q8H PRN Lovena Le, [name], PA-C       hydrOXYzine (ATARAX) tablet 25 mg  25 mg Oral Q6H PRN Harlow Asa, MD   25 mg at 11/15/21 2036   loperamide (IMODIUM) capsule 2-4 mg  2-4 mg Oral PRN Harlow Asa, MD       LORazepam (ATIVAN) tablet 1 mg  1 mg Oral Q8H PRN Harlow Asa, MD       Or   LORazepam (ATIVAN) injection 2 mg  2 mg Intramuscular Q8H PRN Nelda Marseille, Caterra Ostroff E, MD       LORazepam (ATIVAN) tablet 1 mg  1 mg Oral Q6H PRN Nelda Marseille, Caelen Higinbotham E, MD       magnesium hydroxide (MILK OF MAGNESIA) suspension 30 mL  30 mL Oral Daily PRN Margorie John W, PA-C       multivitamin with minerals tablet 1 tablet  1 tablet Oral Daily Harlow Asa, MD   1 tablet at 11/16/21 0751   nicotine polacrilex (NICORETTE) gum 2 mg  2 mg Oral PRN Harlow Asa, MD   2 mg at  11/16/21 0752   OLANZapine (ZYPREXA) tablet 10 mg  10 mg Oral QHS Damita Dunnings B, MD       OLANZapine (ZYPREXA) tablet 5 mg  5 mg Oral Daily McQuilla, Joyce Gross B, MD   5 mg at 11/16/21 1053   ondansetron (ZOFRAN-ODT) disintegrating tablet 4 mg  4 mg Oral Q6H PRN Harlow Asa, MD       thiamine tablet 100 mg  100 mg Oral Daily Nelda Marseille, Treavor Blomquist E, MD   100 mg at 11/16/21 0751   traZODone (DESYREL) tablet 50 mg  50 mg Oral QHS PRN Prescilla Sours, PA-C   50 mg at 11/15/21 2036    Lab Results:  Results for orders placed or performed during the hospital encounter of 11/15/21 (from the past 48 hour(s))  TSH     Status: None   Collection Time: 11/15/21  6:49 PM  Result Value Ref Range   TSH 0.667 0.350 - 4.500 uIU/mL    Comment: Performed by a 3rd Generation assay with a functional sensitivity of <=0.01 uIU/mL. Performed at [facility], [place] [address] [place], [place] [address]   RPR     Status: None   Collection Time: 11/15/21  6:49 PM  Result Value Ref Range   RPR Ser Ql NON REACTIVE NON REACTIVE    Comment: Performed at [facility], [place] [address], [place], [place] [address]  Vitamin B12     Status: None   Collection Time: 11/15/21  6:49 PM  Result Value Ref Range   Vitamin B-12 348 180 - 914 pg/mL    Comment: (NOTE) This assay is not validated for testing neonatal or myeloproliferative syndrome specimens for Vitamin B12 levels. Performed at [facility], [place] [address], [place], [place] [address]   HIV Antibody (routine testing w rflx)     Status: None   Collection Time: 11/15/21  6:49 PM  Result Value Ref Range   HIV Screen 4th Generation wRfx Non Reactive Non Reactive    Comment: Performed at [facility], [place] [address] [place], [place] [address]  Lipid panel     Status: None   Collection Time: 11/15/21  6:49 PM  Result Value Ref Range   Cholesterol 151 0 - 200 mg/dL   Triglycerides 98 <150 mg/dL   HDL 42 >40 mg/dL   Total  CHOL/HDL Ratio 3.6 RATIO   VLDL 20 0 - 40 mg/dL   LDL Cholesterol 89 0 - 99 mg/dL    Comment:        Total Cholesterol/HDL:CHD Risk Coronary Heart Disease Risk Table                     Men   Women  1/2 Average Risk   3.4   3.3  Average Risk       5.0   4.4  2 X Average Risk   9.6   7.1  3 X Average Risk  23.4   11.0        Use the calculated Patient Ratio above and the CHD Risk Table to determine the patient's CHD Risk.        ATP III CLASSIFICATION (LDL):  <100     mg/dL   Optimal  100-129  mg/dL   Near or Above                    Optimal  130-159  mg/dL   Borderline  160-189  mg/dL   High  >190     mg/dL   Very High Performed at [facility] [address], [place],  [address]   Hemoglobin A1c     Status: None   Collection Time: 11/15/21  6:49 PM  Result Value Ref Range   Hgb A1c MFr Bld 5.5 4.8 - 5.6 %    Comment: (NOTE) Pre diabetes:  5.7%-6.4%  Diabetes:              >6.4%  Glycemic control for   <7.0% adults with diabetes    Mean Plasma Glucose 111.15 mg/dL    Comment: Performed at [facility] [address], [place], [place] [address]  Sedimentation rate     Status: None   Collection Time: 11/15/21  6:49 PM  Result Value Ref Range   Sed Rate 2 0 - 16 mm/hr    Comment: Performed at [facility], [place] [address], [place], [place] [address]  Hepatitis panel, acute     Status: Abnormal   Collection Time: 11/15/21  6:49 PM  Result Value Ref Range   Hepatitis B Surface Ag NON REACTIVE NON REACTIVE   HCV Ab Reactive (A) NON REACTIVE    Comment: (NOTE) The CDC recommends that a Reactive HCV antibody result be followed up  with a HCV Nucleic Acid Amplification test.     Hep A IgM NON REACTIVE NON REACTIVE   Hep B C IgM NON REACTIVE NON REACTIVE    Comment: Performed at [facility], [place] [address] [place], [place] [address]    Blood Alcohol level:  Lab Results  Component Value Date   [facility]  <10 11/14/2021   ETH <10 19/37/9024    Metabolic Disorder Labs: Lab Results  Component Value Date   HGBA1C 5.5 11/15/2021   MPG 111.15 11/15/2021   No results found for: PROLACTIN Lab Results  Component Value Date   CHOL 151 11/15/2021   TRIG 98 11/15/2021   HDL 42 11/15/2021   CHOLHDL 3.6 11/15/2021   VLDL 20 11/15/2021   LDLCALC 89 11/15/2021    Physical Findings: AIMS: Facial and Oral Movements Muscles of Facial Expression: None, normal Lips and Perioral Area: None, normal Jaw: None, normal Tongue: None, normal,Extremity Movements Upper (arms, wrists, hands, fingers): None, normal Lower (legs, knees, ankles, toes): None, normal, Trunk Movements Neck, shoulders, hips: None, normal, Overall Severity Severity of abnormal movements (highest score from questions above): None, normal Incapacitation due to abnormal movements: None, normal Patient's awareness of abnormal movements (rate only patient's report): No Awareness, Dental Status Current problems with teeth and/or dentures?: No Does patient usually wear dentures?: No  CIWA:  CIWA-Ar Total: 4  Musculoskeletal: Strength & Muscle Tone: within normal limits Gait & Station: normal Patient leans: N/A  Psychiatric Specialty Exam: Presentation  General Appearance: disheveled appearing, casually dressed   Eye Contact: Fleeting at times   Speech:Clear and Coherent but rambling   Speech Volume:Decreased   Handedness:Right     Mood and Affect  Mood:Depressed and Anxious   Affect:Tearful, labile, suspicious, guarded     Thought Process  Thought Processes:concrete, ruminative, circumstantial   Duration of Psychotic Symptoms: Less than six months   Past Diagnosis of Schizophrenia or Psychoactive disorder: No   Descriptions of Associations:Circumstantial   Orientation:Partial (oriented to person, year, month and city but not situation)   Thought Content:Paranoia and perseveration about delusional beliefs;  has delusion he has psychic abilities and persecutory delusion that he will be killed by firing squad; endorses belief we can read his mind; endorses AVH; endorsed thought broadcasting; reports receiving messages from unknown sources and from deceased brother   Hallucinations:Hallucinations: Auditory; Visual Description of Auditory Hallucinations: hearing voices tell him that he will be killed Visual hallucinations: Seeing his brother   Ideas of Reference:receiving messages from deceased brother   Suicidal Thoughts:Suicidal Thoughts: No   Homicidal  Thoughts:Homicidal Thoughts: No     Sensorium  Memory:Limited secondary to psychosis   Judgment:Impaired   Insight:None     Executive Functions  Concentration: Poor   Attention Span:Poor   Blessing secondary to psychosis   Language:Fair     Psychomotor Activity  Psychomotor Activity:Fidgety - at one point gets on his knees in his room as if to pray     Assets  Assets:Resilience     Sleep  7 hours    Physical Exam HENT:     Head: Normocephalic and atraumatic.  Pulmonary:     Effort: Pulmonary effort is normal.  Neurological:     General: No focal deficit present.     Mental Status: He is alert.   Review of Systems  Respiratory:  Positive for shortness of breath.   Cardiovascular:  Negative for chest pain.  Gastrointestinal:  Negative for diarrhea, nausea and vomiting.  Psychiatric/Behavioral:  Positive for depression and hallucinations. Negative for suicidal ideas. The patient does not have insomnia.   Blood pressure 116/80, pulse 81, temperature 98 F (36.7 C), temperature source Oral, resp. rate 16, height 5' 6"  (1.676 m), weight 62.6 kg, SpO2 99 %. Body mass index is 22.27 kg/m.   Treatment Plan Summary: Daily contact with patient to assess and evaluate symptoms and progress in treatment and Medication management  Galileo Colello is a 36 yo patient w/ no known PPH who  presents endorsing AVH, delusions, and depressed mood. Today patient is floridly psychotic, paranoid of MD believing that he will be killed, and believing that providers are reading his mind. Patient is isolative staying to his room, but his door is often open -he is seen standing and leaning over his heater under the window most of the time at times on the floor as if in prayer. Patient thoughts are disorganized and he is heard being very dysphoric and anxious on the phone with his parents.   Labs Reviewed: EtoH - WNL, UDS (+ THC), CBC- WBC 13.1/MCV 100.9/Lymphs 4.3, BMP- WNL, Salicylate- WNL, Acetaminophen- 14, Hepatitis panel- Hep C reactive,B12- WNL,ESR- WNL,A1c- WNL, HIV- (-) , Lipid panel- WNL, RPR- (-),  TSH- WNL,  Labs Pending: ANA w/ reflex, Ceruloplasmin, Heavy metals,  B1, Hep C quant, repeat CBC and Hepatic function panel EKG: QTC- 432 CT Head: Negative head CT.   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 ( R/o Substance induced psychosis vs MDD, recurrent, severe w/ psychotic features vs Schizophreniform disorder vs Bipolar disorder, current episode depressed w/ psychotic features) - Increase Zyprexa to 76m daily and 137mQHS - Cogentin 0.66m29mid PRN  - [facility] Psychotic Labs Workup, thus far negative - SW recc to look for OP therapy and med mgmt   PRN -Tylenol 650m28mh, pain -Maalox 30ml66m, indigestion -Atarax 266mg 72m anxiety -Milk of Mag 30mL, 40mtipation -Trazodone 50mg QH53mnsomnia    Agitation Protocol: Haldol 66mg IM o58mo q8h PRN, Ativan 1mg po or77mg IM q 819murs PRN, Benadryl 50mg IM or 80m8 hours PRN   Hx of Gastric Ulcers - Continue home Pepcid 20mg BID   T93mco use disorder - 21mg patch   33mC ab positive/Transaminitis - Hep C RNA Quant lab pending, will  need f/u OP - AST 50 and ALT 83 - repeating LFTs for trending  Leukocytosis - Repeating CBC for trending  EtoH use (r/o alcohol use d/o) Cocaine use - episodic (r/o stimulant  use d/o) Cannabis use d/o in  early remission - Last drink 4-5 days prior to admission - Reports 2-3 beers / day but has hx of Dts with withdrawal - Continue CIWA (recent scores 2,3,4) - Counseled on need to abstain from alcohol and illicit substances - would benefit from SA treatment after discharge   Safety: Q17mn checks   PGY-2 JFreida Busman MD 11/16/2021, 1:40 PM

## 2021-11-16 NOTE — Progress Notes (Signed)
Dar Note: Patient presents with paranoid behavior and anxious affect.  Observed responding to internal stimuli, looking up and talking to himself, laughing out loud at times.  Voiced concern about the provider going to kill him today.  Asking staff to get him a Chief Executive Officer.  Patient reassured and reminded of safety checks on the unit.  Routine safety checks maintained.  Patient is safe on and off the unit.

## 2021-11-16 NOTE — Progress Notes (Signed)
Pt was encouraged but didn't attend orientation/goals group. ?

## 2021-11-16 NOTE — Progress Notes (Signed)
Pt continues to be disorganized and observed talking to people not seen by staff    11/16/21 2100  Psych Admission Type (Psych Patients Only)  Admission Status Involuntary  Psychosocial Assessment  Patient Complaints Suspiciousness;Worrying;Confusion  Information systems manager;Anxious;Worried  Affect Anxious;Irritable;Preoccupied  Speech Rapid;Tangential  Interaction Assertive  Motor Activity Restless  Appearance/Hygiene Disheveled;In scrubs  Behavior Characteristics Cooperative  Mood Preoccupied;Suspicious  Thought Process  Coherency Circumstantial;Disorganized;Flight of ideas;Loose associations;Tangential  Content Blaming others;Obsessions;Preoccupation;Paranoia  Delusions Paranoid  Perception Hallucinations  Hallucination Auditory;Visual  Judgment Impaired  Confusion Mild  Danger to Self  Current suicidal ideation? Denies  Danger to Others  Danger to Others None reported or observed

## 2021-11-16 NOTE — Progress Notes (Signed)
Recreation Therapy Notes  INPATIENT RECREATION THERAPY ASSESSMENT  Patient Details Name: Jeffrey Holland MRN: [id number] DOB: December 03, 1985 Today's Date: 11/16/2021       Information Obtained From: Other (Comment) (Some responses were from patient as well)  Able to Participate in Assessment/Interview:  (Pt had a hard time participating in assessment.  Pt kept saying we were going to kill him in the shower with a firing squad.  Pt would also stop answering questions, drop to his knees, kiss the floor and start praying.)  Patient Presentation: Anxious, Confused (Delusional, Tearful)  Reason for Admission (Per Patient): Other (Comments) (per chart: delusions; Psychosis)  Patient Stressors: Family, Other (Comment) (per chart; finances, housing)  Coping Skills:    (Unable to answer)  Leisure Interests (2+):  Individual - Other (Comment) (Pt stated rapping)  Frequency of Recreation/Participation: Other (Comment) (Patient stated daily)  Awareness of Community Resources:   (Unable to answer)  Expressed Interest in [place]:  (Unable to answer)  [place] of Residence:  [place]  Patient Main Form of Transportation:  (Unable to answer)  Patient Strengths:  Unable to answer  Patient Identified Areas of Improvement:  Unable to answer  Patient Goal for Hospitalization:  Unable to answer  Current SI (including self-harm):   (Unable to answer)  Current HI:   (Unable to answer)  Current AVH:  (Unable to answer)  Staff Intervention Plan: Group Attendance, Collaborate with Interdisciplinary Treatment Team  Consent to Intern Participation: N/A   Victorino Sparrow, Vickki Muff, Londin Antone A 11/16/2021, 12:21 PM

## 2021-11-16 NOTE — [facility] Suicide Risk Assessment (Signed)
[facility] INPATIENT:  Family/Significant Other Suicide Prevention Education  Suicide Prevention Education:  Education Completed; mother Roi Jafari [phone]), has been identified by the patient as the family member/significant other with whom the patient will be residing, and identified as the person(s) who will aid the patient in the event of a mental health crisis (suicidal ideations/suicide attempt).  With written consent from the patient, the family member/significant other has been provided the following suicide prevention education, prior to the and/or following the discharge of the patient.  CSW spoke with this patients mother who reported that this patient has been working 12-14 hour shifts everyday for the past few months. They state he has not been eating or sleeping and gets around 1-2 hours of sleep a day. They stated that he and his coworkers often drink and he also has been heavily using cannabis.  They began to notice him becoming paranoid and making statements about parents being on his email and being able to hear their conversations. She stated it continued to become worse and he kicked in their bedroom door.   Pt's mother states that this patient has never had these symptoms or acted in this way before. This patient is able to return home at discharge and there are no weapons or safety concerns in the house.   The suicide prevention education provided includes the following: Suicide risk factors Suicide prevention and interventions National Suicide Hotline telephone number [facility] assessment telephone number [facility] Emergency Assistance 911 [facility] and/or Residential Mobile Crisis Unit telephone number  Request made of family/significant other to: Remove weapons (e.g., guns, rifles, knives), all items previously/currently identified as safety concern.   Remove drugs/medications (over-the-counter, prescriptions, illicit drugs), all items  previously/currently identified as a safety concern.  The family member/significant other verbalizes understanding of the suicide prevention education information provided.  The family member/significant other agrees to remove the items of safety concern listed above.  Amiaya Mcneeley A Shaneen Reeser 11/16/2021, 1:18 PM

## 2021-11-17 DIAGNOSIS — F29 Unspecified psychosis not due to a substance or known physiological condition: Secondary | ICD-10-CM | POA: Diagnosis not present

## 2021-11-17 LAB — CBC WITH DIFFERENTIAL/PLATELET
Abs Immature Granulocytes: 0.02 10*3/uL (ref 0.00–0.07)
Basophils Absolute: 0.1 10*3/uL (ref 0.0–0.1)
Basophils Relative: 1 %
Eosinophils Absolute: 0.3 10*3/uL (ref 0.0–0.5)
Eosinophils Relative: 3 %
HCT: 51 % (ref 39.0–52.0)
Hemoglobin: 17.2 g/dL — ABNORMAL HIGH (ref 13.0–17.0)
Immature Granulocytes: 0 %
Lymphocytes Relative: 53 %
Lymphs Abs: 5.5 10*3/uL — ABNORMAL HIGH (ref 0.7–4.0)
MCH: 34.2 pg — ABNORMAL HIGH (ref 26.0–34.0)
MCHC: 33.7 g/dL (ref 30.0–36.0)
MCV: 101.4 fL — ABNORMAL HIGH (ref 80.0–100.0)
Monocytes Absolute: 0.8 10*3/uL (ref 0.1–1.0)
Monocytes Relative: 8 %
Neutro Abs: 3.5 10*3/uL (ref 1.7–7.7)
Neutrophils Relative %: 35 %
Platelets: 305 10*3/uL (ref 150–400)
RBC: 5.03 MIL/uL (ref 4.22–5.81)
RDW: 12.9 % (ref 11.5–15.5)
WBC: 10.2 10*3/uL (ref 4.0–10.5)
nRBC: 0 % (ref 0.0–0.2)

## 2021-11-17 LAB — HEPATIC FUNCTION PANEL
ALT: 103 U/L — ABNORMAL HIGH (ref 0–44)
AST: 62 U/L — ABNORMAL HIGH (ref 15–41)
Albumin: 4.6 g/dL (ref 3.5–5.0)
Alkaline Phosphatase: 51 U/L (ref 38–126)
Bilirubin, Direct: 0.1 mg/dL (ref 0.0–0.2)
Indirect Bilirubin: 1.3 mg/dL — ABNORMAL HIGH (ref 0.3–0.9)
Total Bilirubin: 1.4 mg/dL — ABNORMAL HIGH (ref 0.3–1.2)
Total Protein: 8.6 g/dL — ABNORMAL HIGH (ref 6.5–8.1)

## 2021-11-17 LAB — CERULOPLASMIN: Ceruloplasmin: 20.5 mg/dL (ref 16.0–31.0)

## 2021-11-17 LAB — ANA W/REFLEX IF POSITIVE: Anti Nuclear Antibody (ANA): NEGATIVE

## 2021-11-17 LAB — ACETAMINOPHEN LEVEL: Acetaminophen (Tylenol), Serum: <10 ug/mL — ABNORMAL LOW (ref 10–30)

## 2021-11-17 MED ORDER — OLANZAPINE 10 MG PO TABS
10.0000 mg | ORAL_TABLET | Freq: Every day | ORAL | Status: DC
  Administered 2021-11-18 – 2021-11-21 (×4): 10 mg via ORAL
  Filled 2021-11-17 (×6): qty 1

## 2021-11-17 MED ORDER — OLANZAPINE 5 MG PO TABS
5.0000 mg | ORAL_TABLET | Freq: Once | ORAL | Status: AC
  Administered 2021-11-17: 5 mg via ORAL
  Filled 2021-11-17: qty 1

## 2021-11-17 NOTE — Progress Notes (Signed)
Pt was encouraged but didn't attend orientation/goals group. ?

## 2021-11-17 NOTE — Progress Notes (Addendum)
[facility] MD Progress Note  11/17/2021 6:46 PM Jeffrey Holland  MRN:  [id number] Subjective:   Jeffrey Holland is a 36 y.o. male with no known past psychiatric history, who was initially admitted for inpatient psychiatric hospitalization on 11/15/2021 for management of hallucinations and delusions. The patient is currently on Hospital Day 1.   Case was discussed in the multidisciplinary team. MAR was reviewed and patient was compliant with medications.  He did not require any PRN's for agitation.     Psychiatric Team made the following recommendations yesterday:  - Increase Zyprexa to 58m daily and 124mQHS - Cogentin 0.37m88mid PRN  - First Break Psychotic Labs Workup, thus far negative - SW recc to look for OP therapy and med mgmt  Objectively patient is bizarre appearing concerned and worried, reluctant to leave his room. Patient reports that he thoughts someone was going to kill him last night and reports that this made it difficult to sleep. Patient reports that he was also having strange dreams. Patient reports that today he still feels his mood is "down." Patient reports that he has been seeing his deceased brother and getting messages from him and he is somewhat disturbed by this. Patient reports that he recalls this happening when he was 10 right after his brother died and once a few years ago. Patient reports that he has noticed other patient on the unit talking to air and he says that he has been accused of doing this too, but he is adamant that he always see's someone. Patient reports that he is also still hearing the 3-4 voices in his head and that they are the one's telling him that people are trying to hurt him. Patient reports that he is not truly having SI or HI. Patient reports that he does feel more safe on the unit today. He expresses belief he can read thoughts because he is "psychic" and that others can read his mind.  Principal Problem: Psychosis, unspecified psychosis type  (HCCMuscoyiagnosis: Principal Problem:   Psychosis, unspecified psychosis type (HCCBentonctive Problems:   Alcohol abuse   Cannabis abuse   Cocaine abuse, episodic use (HCCKanaugaTotal Time spent with patient: I personally spent 25 minutes on the unit in direct patient care. The direct patient care time included face-to-face time with the patient, reviewing the patient's chart, communicating with other professionals, and coordinating care. Greater than 50% of this time was spent in counseling or coordinating care with the patient regarding goals of hospitalization, psycho-education, and discharge planning needs.  Past Psychiatric History:  See H & P  Past Medical History:  Past Medical History:  Diagnosis Date   Anxiety    Asthma    Panic     Family History: see H&P  Family Psychiatric  History:  See H&P  Social History:  Social History   Substance and Sexual Activity  Alcohol Use Yes     Social History   Substance and Sexual Activity  Drug Use Yes   Types: Marijuana, Methamphetamines, Cocaine    Social History   Socioeconomic History   Marital status: Single    Spouse name: Not on file   Number of children: Not on file   Years of education: Not on file   Highest education level: Not on file  Occupational History   Not on file  Tobacco Use   Smoking status: Every Day    Packs/day: 1.00    Types: Cigarettes   Smokeless tobacco: Not on  file  Vaping Use   Vaping Use: Every day  Substance and Sexual Activity   Alcohol use: Yes   Drug use: Yes    Types: Marijuana, Methamphetamines, Cocaine   Sexual activity: Yes    Birth control/protection: None  Other Topics Concern   Not on file  Social History Narrative   ** Merged History Encounter **       Social Determinants of Health   Financial Resource Strain: Not on file  Food Insecurity: Not on file  Transportation Needs: Not on file  Physical Activity: Not on file  Stress: Not on file  Social Connections: Not on  file   Sleep: Good per report of nursing; perceived as poor by patient  Appetite:  Poor  Current Medications: Current Facility-Administered Medications  Medication Dose Route Frequency Provider Last Rate Last Admin   acetaminophen (TYLENOL) tablet 650 mg  650 mg Oral Q6H PRN Prescilla Sours, PA-C       alum & mag hydroxide-simeth (MAALOX/MYLANTA) 200-200-20 MG/5ML suspension 30 mL  30 mL Oral Q4H PRN Lovena Le, [name], PA-C       benztropine (COGENTIN) tablet 0.5 mg  0.5 mg Oral BID PRN Harlow Asa, MD       diphenhydrAMINE (BENADRYL) capsule 50 mg  50 mg Oral Q8H PRN Margorie John W, PA-C       Or   diphenhydrAMINE (BENADRYL) injection 50 mg  50 mg Intramuscular Q8H PRN Lovena Le, [name], PA-C       famotidine (PEPCID) tablet 20 mg  20 mg Oral BID Damita Dunnings B, MD   20 mg at 11/17/21 1713   haloperidol (HALDOL) tablet 5 mg  5 mg Oral Q8H PRN Prescilla Sours, PA-C       Or   haloperidol lactate (HALDOL) injection 5 mg  5 mg Intramuscular Q8H PRN Lovena Le, [name], PA-C       hydrOXYzine (ATARAX) tablet 25 mg  25 mg Oral Q6H PRN Viann Fish E, MD   25 mg at 11/16/21 2054   loperamide (IMODIUM) capsule 2-4 mg  2-4 mg Oral PRN Harlow Asa, MD       LORazepam (ATIVAN) tablet 1 mg  1 mg Oral Q8H PRN Harlow Asa, MD       Or   LORazepam (ATIVAN) injection 2 mg  2 mg Intramuscular Q8H PRN Nelda Marseille, Anthonny Schiller E, MD       LORazepam (ATIVAN) tablet 1 mg  1 mg Oral Q6H PRN Viann Fish E, MD   1 mg at 11/17/21 0839   magnesium hydroxide (MILK OF MAGNESIA) suspension 30 mL  30 mL Oral Daily PRN Margorie John W, PA-C       multivitamin with minerals tablet 1 tablet  1 tablet Oral Daily Harlow Asa, MD   1 tablet at 11/17/21 0839   nicotine polacrilex (NICORETTE) gum 2 mg  2 mg Oral PRN Harlow Asa, MD   2 mg at 11/16/21 0752   OLANZapine (ZYPREXA) tablet 10 mg  10 mg Oral QHS Damita Dunnings B, MD   10 mg at 11/16/21 2054   [START ON 11/18/2021] OLANZapine (ZYPREXA) tablet 10 mg  10 mg  Oral Daily Damita Dunnings B, MD       ondansetron (ZOFRAN-ODT) disintegrating tablet 4 mg  4 mg Oral Q6H PRN Nelda Marseille, Sebron Mcmahill E, MD       thiamine tablet 100 mg  100 mg Oral Daily Nelda Marseille, Drystan Reader E, MD   100 mg at  11/17/21 0839   traZODone (DESYREL) tablet 50 mg  50 mg Oral QHS PRN Prescilla Sours, PA-C   50 mg at 11/16/21 2054    Lab Results:  Results for orders placed or performed during the hospital encounter of 11/15/21 (from the past 48 hour(s))  TSH     Status: None   Collection Time: 11/15/21  6:49 PM  Result Value Ref Range   TSH 0.667 0.350 - 4.500 uIU/mL    Comment: Performed by a 3rd Generation assay with a functional sensitivity of <=0.01 uIU/mL. Performed at [facility], [place] [address], [place], [place] [address]   ANA w/Reflex if Positive     Status: None   Collection Time: 11/15/21  6:49 PM  Result Value Ref Range   Anti Nuclear Antibody (ANA) Negative Negative    Comment: (NOTE) Performed At: [facility] [place], [place] [id number] Rush Farmer MD [id number]   RPR     Status: None   Collection Time: 11/15/21  6:49 PM  Result Value Ref Range   RPR Ser Ql NON REACTIVE NON REACTIVE    Comment: Performed at [facility], [name] [place], [place] [id number]  Vitamin B12     Status: None   Collection Time: 11/15/21  6:49 PM  Result Value Ref Range   Vitamin B-12 348 180 - 914 pg/mL    Comment: (NOTE) This assay is not validated for testing neonatal or myeloproliferative syndrome specimens for Vitamin B12 levels. Performed at [facility], [place] [address], [place], [place] [address]   HIV Antibody (routine testing w rflx)     Status: None   Collection Time: 11/15/21  6:49 PM  Result Value Ref Range   HIV Screen 4th Generation wRfx Non Reactive Non Reactive    Comment: Performed at [facility], [place] [address], [place], [place] [address]  Ceruloplasmin     Status: None   Collection  Time: 11/15/21  6:49 PM  Result Value Ref Range   Ceruloplasmin 20.5 16.0 - 31.0 mg/dL    Comment: (NOTE) Performed At: [facility] [place], [place] [id number] Rush Farmer MD [id number]   Lipid panel     Status: None   Collection Time: 11/15/21  6:49 PM  Result Value Ref Range   Cholesterol 151 0 - 200 mg/dL   Triglycerides 98 <150 mg/dL   HDL 42 >40 mg/dL   Total CHOL/HDL Ratio 3.6 RATIO   VLDL 20 0 - 40 mg/dL   LDL Cholesterol 89 0 - 99 mg/dL    Comment:        Total Cholesterol/HDL:CHD Risk Coronary Heart Disease Risk Table                     Men   Women  1/2 Average Risk   3.4   3.3  Average Risk       5.0   4.4  2 X Average Risk   9.6   7.1  3 X Average Risk  23.4   11.0        Use the calculated Patient Ratio above and the CHD Risk Table to determine the patient's CHD Risk.        ATP III CLASSIFICATION (LDL):  <100     mg/dL   Optimal  100-129  mg/dL   Near or Above  Optimal  130-159  mg/dL   Borderline  160-189  mg/dL   High  >190     mg/dL   Very High Performed at [facility] [address] [place], [place] [address]   Hemoglobin A1c     Status: None   Collection Time: 11/15/21  6:49 PM  Result Value Ref Range   Hgb A1c MFr Bld 5.5 4.8 - 5.6 %    Comment: (NOTE) Pre diabetes:          5.7%-6.4%  Diabetes:              >6.4%  Glycemic control for   <7.0% adults with diabetes    Mean Plasma Glucose 111.15 mg/dL    Comment: Performed at [facility] [address] [place], [place] [address]  Sedimentation rate     Status: None   Collection Time: 11/15/21  6:49 PM  Result Value Ref Range   Sed Rate 2 0 - 16 mm/hr    Comment: Performed at [facility], [place] [address] [place], [place] [address]  Hepatitis panel, acute     Status: Abnormal   Collection Time: 11/15/21  6:49 PM  Result Value Ref Range   Hepatitis B Surface Ag NON REACTIVE NON REACTIVE   HCV  Ab Reactive (A) NON REACTIVE    Comment: (NOTE) The CDC recommends that a Reactive HCV antibody result be followed up  with a HCV Nucleic Acid Amplification test.     Hep A IgM NON REACTIVE NON REACTIVE   Hep B C IgM NON REACTIVE NON REACTIVE    Comment: Performed at [facility], [place] [address] [place], [place] [address]  Acetaminophen level     Status: Abnormal   Collection Time: 11/17/21  6:25 AM  Result Value Ref Range   Acetaminophen (Tylenol), Serum <10 (L) 10 - 30 ug/mL    Comment: (NOTE) Therapeutic concentrations vary significantly. A range of 10-30 ug/mL  may be an effective concentration for many patients. However, some  are best treated at concentrations outside of this range. Acetaminophen concentrations >150 ug/mL at 4 hours after ingestion  and >50 ug/mL at 12 hours after ingestion are often associated with  toxic reactions.  Performed at [facility], [place] [address] [place], [place] [address]   Hepatic function panel     Status: Abnormal   Collection Time: 11/17/21  6:25 AM  Result Value Ref Range   Total Protein 8.6 (H) 6.5 - 8.1 g/dL   Albumin 4.6 3.5 - 5.0 g/dL   AST 62 (H) 15 - 41 U/L   ALT 103 (H) 0 - 44 U/L   Alkaline Phosphatase 51 38 - 126 U/L   Total Bilirubin 1.4 (H) 0.3 - 1.2 mg/dL   Bilirubin, Direct 0.1 0.0 - 0.2 mg/dL   Indirect Bilirubin 1.3 (H) 0.3 - 0.9 mg/dL    Comment: Performed at [facility], [place] [address] [place], [place] [address]  CBC with Differential/Platelet     Status: Abnormal   Collection Time: 11/17/21  6:25 AM  Result Value Ref Range   WBC 10.2 4.0 - 10.5 K/uL   RBC 5.03 4.22 - 5.81 MIL/uL   Hemoglobin 17.2 (H) 13.0 - 17.0 g/dL   HCT 51.0 39.0 - 52.0 %   MCV 101.4 (H) 80.0 - 100.0 fL   MCH 34.2 (H) 26.0 - 34.0 pg   MCHC 33.7 30.0 - 36.0 g/dL  RDW 12.9 11.5 - 15.5 %   Platelets 305 150 - 400 K/uL   nRBC 0.0 0.0 - 0.2 %   Neutrophils Relative % 35 %   Neutro Abs 3.5 1.7 -  7.7 K/uL   Lymphocytes Relative 53 %   Lymphs Abs 5.5 (H) 0.7 - 4.0 K/uL   Monocytes Relative 8 %   Monocytes Absolute 0.8 0.1 - 1.0 K/uL   Eosinophils Relative 3 %   Eosinophils Absolute 0.3 0.0 - 0.5 K/uL   Basophils Relative 1 %   Basophils Absolute 0.1 0.0 - 0.1 K/uL   Immature Granulocytes 0 %   Abs Immature Granulocytes 0.02 0.00 - 0.07 K/uL   Reactive, Benign Lymphocytes PRESENT     Comment: Performed at [facility], [place] [address], [place], [place] [address]    Blood Alcohol level:  Lab Results  Component Value Date   ETH <10 11/14/2021   ETH <10 40/97/3532    Metabolic Disorder Labs: Lab Results  Component Value Date   HGBA1C 5.5 11/15/2021   MPG 111.15 11/15/2021   No results found for: PROLACTIN Lab Results  Component Value Date   CHOL 151 11/15/2021   TRIG 98 11/15/2021   HDL 42 11/15/2021   CHOLHDL 3.6 11/15/2021   VLDL 20 11/15/2021   LDLCALC 89 11/15/2021    Physical Findings: AIMS: Facial and Oral Movements Muscles of Facial Expression: None, normal Lips and Perioral Area: None, normal Jaw: None, normal Tongue: None, normal,Extremity Movements Upper (arms, wrists, hands, fingers): None, normal Lower (legs, knees, ankles, toes): None, normal, Trunk Movements Neck, shoulders, hips: None, normal, Overall Severity Severity of abnormal movements (highest score from questions above): None, normal Incapacitation due to abnormal movements: None, normal Patient's awareness of abnormal movements (rate only patient's report): No Awareness, Dental Status Current problems with teeth and/or dentures?: No Does patient usually wear dentures?: No  CIWA:  CIWA-Ar Total: 4  Musculoskeletal: Strength & Muscle Tone: within normal limits Gait & Station: normal Patient leans: N/A  Psychiatric Specialty Exam:  Presentation  General Appearance: disheveled appearing, casually dressed   Eye Contact: Fleeting    Speech:Clear and Coherent     Speech Volume:Decreased   Handedness:Right     Mood and Affect  Mood:Depressed and Anxious   Affect: labile, suspicious, guarded     Thought Process  Thought Processes:concrete, ruminative, circumstantial   Duration of Psychotic Symptoms: Less than six months   Past Diagnosis of Schizophrenia or Psychoactive disorder: No   Descriptions of Associations:Circumstantial   Orientation:Partial (oriented to person, year, month and city but not situation)   Thought Content: Appears paranoid on exam; reports AVH and delusion he is a psychic and that he can read thoughts and others can read his mind; reports receiving messages from deceased brother; less hyper-religious content today   Hallucinations:Hallucinations: Auditory; Visual Description of Auditory Hallucinations: hearing voices tell him that he will be killed Visual hallucinations: Seeing his brother   Ideas of Reference:receiving messages from deceased brother   Suicidal Thoughts:Suicidal Thoughts: No   Homicidal Thoughts:Homicidal Thoughts: No     Sensorium  Memory:Limited secondary to psychosis   Judgment:Impaired   Insight:None     Executive Functions  Concentration: Poor   Attention Span:Poor   [place] secondary to psychosis   Language:Fair     Psychomotor Activity  Psychomotor Activity:Fidgety/ Restlessness     Assets  Assets:Resilience     Sleep  7.5 hours   Physical Exam HENT:  Head: Normocephalic and atraumatic.  Pulmonary:     Effort: Pulmonary effort is normal.  Neurological:     Mental Status: He is alert and oriented to person, place, and time.   Review of Systems  Respiratory:  Negative for shortness of breath.   Cardiovascular:  Negative for chest pain.  Gastrointestinal:  Negative for constipation, diarrhea, nausea and vomiting.  Psychiatric/Behavioral:  Positive for depression and hallucinations. Negative for suicidal ideas. The patient  does not have insomnia.   Blood pressure 128/74, pulse 66, temperature (!) 97.3 F (36.3 C), temperature source Oral, resp. rate 16, height 5' 6"  (1.676 m), weight 62.6 kg, SpO2 100 %. Body mass index is 22.27 kg/m.   Treatment Plan Summary: Daily contact with patient to assess and evaluate symptoms and progress in treatment and Medication management Patient appears to still be paranoid with AVH. Patient is not having SI but endorses that he is having AH that is telling him he should kill himself. Patient continues to appear dysphoric. Patient requires increase in SGA.  Labs Reviewed: EtoH - WNL, UDS (+ THC), CBC- WBC 13.1/MCV 100.9/Lymphs 4.3, BMP- WNL, Salicylate- WNL, Acetaminophen- 14>18> <10, Hepatitis panel- Hep C reactive,B12- WNL,ESR- WNL,A1c- WNL, HIV- (-) , Lipid panel- WNL, RPR- (-),  TSH- WNL, Hepatic Function Panel- ALT 103/ AST 62/ T bili 1.4/ T protein 8.6, ANA w/ reflex (-),Ceruloplasmin- (-), Repeat CBC- Hgb 17.2/ MCV 101.4 Labs Pending:  Heavy metals,  B1, Hep C quant EKG: QTC- 432 CT Head: Negative head CT.   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 ( R/o Substance induced psychosis vs MDD, recurrent, severe w/ psychotic features vs Schizophreniform disorder vs Bipolar disorder, current episode depressed w/ psychotic features) - Increase Zyprexa to 2m bid - Cogentin 0.551mbid PRN  - First Break Psychotic Labs Workup, thus far negative - SW recc to look for OP therapy and med mgmt   PRN -Tylenol 65063m6h, pain -Maalox 68m41mh, indigestion -Atarax 25mg36m, anxiety -Milk of Mag 68mL,50mstipation -Trazodone 50mg Q41minsomnia    Agitation Protocol: Haldol 5mg IM 58mpo q8h PRN, Ativan 1mg po o86mmg IM q 33mours PRN, Benadryl 50mg IM or94mq8 hours PRN   Hx of Gastric Ulcers - Continue home Pepcid 20mg BID   17mcco use disorder - 21mg patch  51m C ab positive/Transaminitis - Hep C RNA Quant lab pending, will need f/u OP - AST trending up to  62 and ALT up to 103 (has been at AST 155 and ALT 228 2 years ago) elevated total bili 1.4 and indirect bili 1.3 - continue to trend and increasing further will need RUQ u/s   Leukocytosis - resolved - Repeat CBC shows WBC 10.2 on 11/17/21   EtoH use (r/o alcohol use d/o) Cocaine use - episodic (r/o stimulant use d/o) 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

## 2021-11-17 NOTE — Group Note (Signed)
Type of Therapy and Topic: Group Therapy: Gratitude  Participation: Minimal   Description of Group: The purpose behind this group is to get people thinking about things for which  they can be grateful. If continued over time, they might begin to spontaneously look for things and  situations for which to be grateful. Gratitude is related to a wide variety of forms of wellbeing , whereas negative attributions can adversely affect relationships.  Several studies have shown that interventions to  increase gratitude can impact areas such as overall life satisfaction, decreased negative affect, increased happiness, the ability to provide emotional support to others, and decreased worrying.   Therapeutic Goals: Patient will learn activities that focus on gratitude in their daily lives. Patient will share gratitude in their daily lives. Patient will learn to develop healthy habits and positive thinking techniques. Patient will receive support and feedback from others  Summary of Progress: Pt attended group. Pt stated that being free makes him happy. Pt walked around group at different times and poked/touched other patients. Pt did no participate further in group discussion.   Therapeutic Modalities: Cognitive Behavioral Therapy Solution Focused Therapy Motivational Interviewing

## 2021-11-17 NOTE — Group Note (Signed)
Recreation Therapy Group Note   Group Topic:Team Building  Group Date: 11/17/2021 Start Time: 1008 End Time: 1035 Facilitators: Caroll Rancher, LRT,CTRS Location: [address] Dayroom   Goal Area(s) Addresses:  Patient will effectively work with peer towards shared goal.  Patient will identify skills used to make activity successful.  Patient will identify how skills used during activity can be used to reach post d/c goals.   Group Description: Straw Bridge. In teams of 3-5, patients were given 15 plastic drinking straws and an equal length of masking tape. Using the materials provided, patients were instructed to build a free standing bridge-like structure to suspend an everyday item (ex: puzzle box) off of the floor or table surface. All materials were required to be used by the team in their design. LRT facilitated post-activity discussion reviewing team process. Patients were encouraged to reflect how the skills used in this activity can be generalized to daily life post discharge.    Affect/Mood: N/A   Participation Level: Did not attend    Clinical Observations/Individualized Feedback:     Plan: Continue to engage patient in RT group sessions 2-3x/week.   Caroll Rancher, LRT,CTRS 11/17/2021 12:17 PM

## 2021-11-17 NOTE — Progress Notes (Signed)
°   11/17/21 0600  Sleep  Number of Hours 7.5

## 2021-11-17 NOTE — Progress Notes (Signed)
Pt continues to be paranoid and delusional , believing we are going to give him a lethal injection.    11/17/21 2000  Psych Admission Type (Psych Patients Only)  Admission Status Involuntary  Psychosocial Assessment  Patient Complaints Suspiciousness  Eye Contact Fair  Facial Expression Animated;Anxious;Worried  Affect Anxious;Irritable;Preoccupied  Speech Rapid;Tangential  Interaction Assertive  Motor Activity Restless  Appearance/Hygiene Disheveled;In scrubs  Behavior Characteristics Cooperative;Restless  Mood Suspicious  Thought Process  Coherency Circumstantial;Disorganized;Flight of ideas;Loose associations;Tangential  Content Blaming others;Obsessions;Preoccupation;Paranoia  Delusions Paranoid  Perception Hallucinations  Hallucination Auditory;Visual  Judgment Impaired  Confusion Mild  Danger to Self  Current suicidal ideation? Denies  Danger to Others  Danger to Others None reported or observed

## 2021-11-17 NOTE — [facility] Group Notes (Signed)
Adult Psychoeducational Group Note  Date:  11/17/2021 Time:  10:34 PM  Group Topic/Focus:  Goals Group:   The focus of this group is to help patients establish daily goals to achieve during treatment and discuss how the patient can incorporate goal setting into their daily lives to aide in recovery.  Participation Level:  Minimal  Participation Quality:  Redirectable  Affect:  Appropriate  Cognitive:  Delusional  Insight: Limited  Engagement in Group:  Lacking  Modes of Intervention:  Discussion  Additional Comments  Dalene Carrow 11/17/2021, 10:34 PM

## 2021-11-17 NOTE — Progress Notes (Signed)
Dar note: Patient still paranoid and disorganized.  Asking for Sani-cloth wipes to clean out the "magical energy" from his room.  Patient observed looking around him and laughing inappropriately.  Continues to reports auditory hallucination.  Ativan 1 mg given for increased agitation with good effect.  Routine safety checks maintained. Did not attend group when invited.  Patient is safe on and off the unit.

## 2021-11-17 NOTE — Progress Notes (Signed)
Pt was encouraged but didn't attend therapeutic relaxation group. ?

## 2021-11-17 NOTE — Progress Notes (Signed)
Pt very paranoid and delusional, pt continues to think wew are going to give him a "lethal injection"

## 2021-11-18 DIAGNOSIS — F29 Unspecified psychosis not due to a substance or known physiological condition: Secondary | ICD-10-CM | POA: Diagnosis not present

## 2021-11-18 MED ORDER — OLANZAPINE 7.5 MG PO TABS
15.0000 mg | ORAL_TABLET | Freq: Every day | ORAL | Status: DC
  Administered 2021-11-18 – 2021-11-19 (×2): 15 mg via ORAL
  Filled 2021-11-18 (×3): qty 2

## 2021-11-18 MED ORDER — LOPERAMIDE HCL 2 MG PO CAPS: 2.0000 mg | ORAL_CAPSULE | ORAL | Status: AC | PRN

## 2021-11-18 MED ORDER — ONDANSETRON 4 MG PO TBDP: 4.0000 mg | ORAL_TABLET | Freq: Four times a day (QID) | ORAL | Status: AC | PRN

## 2021-11-18 MED ORDER — HYDROXYZINE HCL 25 MG PO TABS: 25.0000 mg | ORAL_TABLET | Freq: Four times a day (QID) | ORAL | Status: AC | PRN

## 2021-11-18 MED ORDER — LORAZEPAM 1 MG PO TABS: 1.0000 mg | ORAL_TABLET | Freq: Four times a day (QID) | ORAL | Status: AC | PRN

## 2021-11-18 NOTE — [facility] Group Notes (Signed)
Patient did not attend group. ° ° °Spirituality group facilitated by Chaplain Katy Yeni Jiggetts, BCC.  ° °Group Description: Group focused on topic of hope. Patients participated in facilitated discussion around topic, connecting with one another around experiences and definitions for hope. Group members engaged with visual explorer photos, reflecting on what hope looks like for them today. Group engaged in discussion around how their definitions of hope are present today in hospital.  ° °Modalities: Psycho-social ed, Adlerian, Narrative, MI  ° ° °

## 2021-11-18 NOTE — Progress Notes (Signed)
Pt has been coming out the room responding to internal stimuli, pt needing frequent re-direction. Pt was noted on flow sheets as sleeping 4.25 hrs during the day.

## 2021-11-18 NOTE — Progress Notes (Signed)
[facility] MD Progress Note  11/18/2021 1:12 PM Jeffrey Holland  MRN:  [id number]  Chief Complaint: psychosis  Reason for Admission:   Jeffrey Holland is a 36 y.o. male with no known past psychiatric history, who was initially admitted for inpatient psychiatric hospitalization on 11/15/2021 for management of hallucinations and delusions.  The patient is currently on Hospital Day 3.   Chart Review from last 24 hours:  The patient's chart was reviewed and nursing notes were reviewed. The patient's case was discussed in multidisciplinary team meeting. Per nursing, on day shift he was paranoid, responding to internal stimuli, and required Ativan X1 for agitation. The patient was responding to internal stimuli on night shift and required frequent redirection. He required IM medications for yelling and banging in his room. He reportedly slept 4.25 hours during the day yesterday and only 3.25 hours overnight. Per [facility] he was compliant with scheduled medications and additionally received Trazodone and Vistaril for sleep.  Information Obtained Today During Patient Interview: The patient was seen and evaluated on the unit. On assessment today the patient reports he feels "clearer today."  He perceives he slept better yesterday and that his appetite is improving compared to when he initially came in.  He admits that he had auditory visual hallucinations yesterday but denies any today.  He no longer feels that he has psychic abilities and no longer believes that anyone is trying to kill him or put him before firing squad.  He admits that he still has some suspicions about unknown persons potentially inserting thoughts in his head and still questions if he can read others' minds.  He states he is still getting some messages "inside my head" from his deceased brother.  He believes that he is improving on his medication and denies current medication side effects.  He voices no physical complaints. He states he is showering.  He denies cravings or withdrawal symptoms from substances.  Principal Problem: Psychosis, unspecified psychosis type ([place]) Diagnosis: Principal Problem:   Psychosis, unspecified psychosis type ([place]) Active Problems:   Alcohol abuse   Cannabis abuse   Cocaine abuse, episodic use ([place])  Total Time spent with patient: I personally spent 25 minutes on the unit in direct patient care. The direct patient care time included face-to-face time with the patient, reviewing the patient's chart, communicating with other professionals, and coordinating care. Greater than 50% of this time was spent in counseling or coordinating care with the patient regarding goals of hospitalization, psycho-education, and discharge planning needs.  Past Psychiatric History:  See H & P  Past Medical History:  Past Medical History:  Diagnosis Date   Anxiety    Asthma    Panic     Family History: see H&P  Family Psychiatric  History:  See H&P  Social History:  Social History   Substance and Sexual Activity  Alcohol Use Yes     Social History   Substance and Sexual Activity  Drug Use Yes   Types: Marijuana, Methamphetamines, Cocaine    Social History   Socioeconomic History   Marital status: Single    Spouse name: Not on file   Number of children: Not on file   Years of education: Not on file   Highest education level: Not on file  Occupational History   Not on file  Tobacco Use   Smoking status: Every Day    Packs/day: 1.00    Types: Cigarettes   Smokeless tobacco: Not on file  Vaping Use  Vaping Use: Every day  Substance and Sexual Activity   Alcohol use: Yes   Drug use: Yes    Types: Marijuana, Methamphetamines, Cocaine   Sexual activity: Yes    Birth control/protection: None  Other Topics Concern   Not on file  Social History Narrative   ** Merged History Encounter **       Social Determinants of Health   Financial Resource Strain: Not on file  Food Insecurity: Not on file   Transportation Needs: Not on file  Physical Activity: Not on file  Stress: Not on file  Social Connections: Not on file   Sleep: Slept during the day and slept poorly overnight  Appetite: Improving  Current Medications: Current Facility-Administered Medications  Medication Dose Route Frequency Provider Last Rate Last Admin   acetaminophen (TYLENOL) tablet 650 mg  650 mg Oral Q6H PRN Prescilla Sours, PA-C       alum & mag hydroxide-simeth (MAALOX/MYLANTA) 200-200-20 MG/5ML suspension 30 mL  30 mL Oral Q4H PRN Lovena Le, [name], PA-C       benztropine (COGENTIN) tablet 0.5 mg  0.5 mg Oral BID PRN Nelda Marseille, Tra Wilemon E, MD       diphenhydrAMINE (BENADRYL) capsule 50 mg  50 mg Oral Q8H PRN Margorie John W, PA-C   50 mg at 11/18/21 0336   Or   diphenhydrAMINE (BENADRYL) injection 50 mg  50 mg Intramuscular Q8H PRN Margorie John W, PA-C       famotidine (PEPCID) tablet 20 mg  20 mg Oral BID Damita Dunnings B, MD   20 mg at 11/18/21 0817   haloperidol (HALDOL) tablet 5 mg  5 mg Oral Q8H PRN Prescilla Sours, PA-C   5 mg at 11/18/21 1245   Or   haloperidol lactate (HALDOL) injection 5 mg  5 mg Intramuscular Q8H PRN Margorie John W, PA-C       hydrOXYzine (ATARAX) tablet 25 mg  25 mg Oral Q6H PRN Viann Fish E, MD   25 mg at 11/17/21 2038   loperamide (IMODIUM) capsule 2-4 mg  2-4 mg Oral PRN Harlow Asa, MD       LORazepam (ATIVAN) tablet 1 mg  1 mg Oral Q8H PRN Harlow Asa, MD   1 mg at 11/18/21 8099   Or   LORazepam (ATIVAN) injection 2 mg  2 mg Intramuscular Q8H PRN Harlow Asa, MD       LORazepam (ATIVAN) tablet 1 mg  1 mg Oral Q6H PRN Viann Fish E, MD   1 mg at 11/17/21 0839   magnesium hydroxide (MILK OF MAGNESIA) suspension 30 mL  30 mL Oral Daily PRN Margorie John W, PA-C       multivitamin with minerals tablet 1 tablet  1 tablet Oral Daily Harlow Asa, MD   1 tablet at 11/18/21 0817   nicotine polacrilex (NICORETTE) gum 2 mg  2 mg Oral PRN Harlow Asa, MD   2 mg at  11/16/21 0752   OLANZapine (ZYPREXA) tablet 10 mg  10 mg Oral QHS Damita Dunnings B, MD   10 mg at 11/17/21 2038   OLANZapine (ZYPREXA) tablet 10 mg  10 mg Oral Daily Damita Dunnings B, MD   10 mg at 11/18/21 0817   ondansetron (ZOFRAN-ODT) disintegrating tablet 4 mg  4 mg Oral Q6H PRN Harlow Asa, MD       thiamine tablet 100 mg  100 mg Oral Daily Nelda Marseille, Deshanda Molitor E, MD   100 mg at  11/18/21 0817   traZODone (DESYREL) tablet 50 mg  50 mg Oral QHS PRN Prescilla Sours, PA-C   50 mg at 11/17/21 2038    Lab Results:  Results for orders placed or performed during the hospital encounter of 11/15/21 (from the past 48 hour(s))  Acetaminophen level     Status: Abnormal   Collection Time: 11/17/21  6:25 AM  Result Value Ref Range   Acetaminophen (Tylenol), Serum <10 (L) 10 - 30 ug/mL    Comment: (NOTE) Therapeutic concentrations vary significantly. A range of 10-30 ug/mL  may be an effective concentration for many patients. However, some  are best treated at concentrations outside of this range. Acetaminophen concentrations >150 ug/mL at 4 hours after ingestion  and >50 ug/mL at 12 hours after ingestion are often associated with  toxic reactions.  Performed at [facility], [place] [address] [place], [place] [address]   Hepatic function panel     Status: Abnormal   Collection Time: 11/17/21  6:25 AM  Result Value Ref Range   Total Protein 8.6 (H) 6.5 - 8.1 g/dL   Albumin 4.6 3.5 - 5.0 g/dL   AST 62 (H) 15 - 41 U/L   ALT 103 (H) 0 - 44 U/L   Alkaline Phosphatase 51 38 - 126 U/L   Total Bilirubin 1.4 (H) 0.3 - 1.2 mg/dL   Bilirubin, Direct 0.1 0.0 - 0.2 mg/dL   Indirect Bilirubin 1.3 (H) 0.3 - 0.9 mg/dL    Comment: Performed at [facility], [place] [address], [place], [place] [address]  CBC with Differential/Platelet     Status: Abnormal   Collection Time: 11/17/21  6:25 AM  Result Value Ref Range   WBC 10.2 4.0 - 10.5 K/uL   RBC 5.03 4.22 - 5.81 MIL/uL    Hemoglobin 17.2 (H) 13.0 - 17.0 g/dL   HCT 51.0 39.0 - 52.0 %   MCV 101.4 (H) 80.0 - 100.0 fL   MCH 34.2 (H) 26.0 - 34.0 pg   MCHC 33.7 30.0 - 36.0 g/dL   RDW 12.9 11.5 - 15.5 %   Platelets 305 150 - 400 K/uL   nRBC 0.0 0.0 - 0.2 %   Neutrophils Relative % 35 %   Neutro Abs 3.5 1.7 - 7.7 K/uL   Lymphocytes Relative 53 %   Lymphs Abs 5.5 (H) 0.7 - 4.0 K/uL   Monocytes Relative 8 %   Monocytes Absolute 0.8 0.1 - 1.0 K/uL   Eosinophils Relative 3 %   Eosinophils Absolute 0.3 0.0 - 0.5 K/uL   Basophils Relative 1 %   Basophils Absolute 0.1 0.0 - 0.1 K/uL   Immature Granulocytes 0 %   Abs Immature Granulocytes 0.02 0.00 - 0.07 K/uL   Reactive, Benign Lymphocytes PRESENT     Comment: Performed at [facility], [place] [address] [place],  [address]    Blood Alcohol level:  Lab Results  Component Value Date   [facility] <10 11/14/2021   ETH <10 43/15/4008    Metabolic Disorder Labs: Lab Results  Component Value Date   HGBA1C 5.5 11/15/2021   MPG 111.15 11/15/2021   No results found for: PROLACTIN Lab Results  Component Value Date   CHOL 151 11/15/2021   TRIG 98 11/15/2021   HDL 42 11/15/2021   CHOLHDL 3.6 11/15/2021   VLDL 20 11/15/2021   LDLCALC 89 11/15/2021    Physical Findings: AIMS: Facial and Oral Movements Muscles of Facial Expression: None, normal Lips  and Perioral Area: None, normal Jaw: None, normal Tongue: None, normal,Extremity Movements Upper (arms, wrists, hands, fingers): None, normal Lower (legs, knees, ankles, toes): None, normal, Trunk Movements Neck, shoulders, hips: None, normal, Overall Severity Severity of abnormal movements (highest score from questions above): None, normal Incapacitation due to abnormal movements: None, normal Patient's awareness of abnormal movements (rate only patient's report): No Awareness, Dental Status Current problems with teeth and/or dentures?: No Does patient usually wear dentures?: No  CIWA:   CIWA-Ar Total: 0  Musculoskeletal: Strength & Muscle Tone: within normal limits Gait & Station: normal Patient leans: N/A  Psychiatric Specialty Exam:  Presentation  General Appearance: fair hygiene, appears older than stated age   Eye Contact: Fair - improved compared to yesterday   Speech:Clear and Coherent    Speech Volume:Normal   Handedness:Right     Mood and Affect  Mood:Described as improving - appears calmer and less paranoid   Affect: Far less labile - less paranoid appearing but still constricted     Thought Process  Thought Processes:concrete, more linear today   Duration of Psychotic Symptoms: Less than six months   Past Diagnosis of Schizophrenia or Psychoactive disorder: No   Orientation:Oriented to self, year, month and city but not situation   Thought Content: Has some residual belief in thought insertion and delusion he can read minds; denies current AVH or thought broadcasting; appears less paranoid on exam but continues to have delusion he is receiving messages from his deceased brother; is not grossly responding to internal/external stimuli on exam   Hallucinations: Had AVH yesterday - denies today   Ideas of Reference:receiving messages from deceased brother   Suicidal Thoughts:Suicidal Thoughts: No   Homicidal Thoughts:Homicidal Thoughts: No     Sensorium  Memory:Limited secondary to psychosis   Judgment:Impaired   Insight: Poor     Executive Functions  Concentration: Improving   Attention Span:Improving    [place] secondary to psychosis   Language:Fair     Psychomotor Activity  Psychomotor Activity:Normal     Assets  Assets:Resilience     Sleep  3.25 hours overnight (slept during the day yesterday)   Physical Exam HENT:     Head: Normocephalic and atraumatic.  Pulmonary:     Effort: Pulmonary effort is normal.  Neurological:     General: No focal deficit present.     Mental  Status: He is alert.   Review of Systems  Respiratory:  Negative for shortness of breath.   Cardiovascular:  Negative for chest pain.  Gastrointestinal:  Negative for constipation, diarrhea, nausea and vomiting.  Blood pressure 111/75, pulse (!) 101, temperature 97.9 F (36.6 C), temperature source Oral, resp. rate 16, height 5' 6"  (1.676 m), weight 62.6 kg, SpO2 98 %. Body mass index is 22.27 kg/m.   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 Psychiatric Diagnoses and Treatment:   Unspecified schizophrenia  spectrum and other psychotic d/o ( R/o Substance induced psychosis vs MDD, recurrent, severe w/ psychotic features vs Schizophreniform disorder vs Bipolar disorder, current episode depressed w/ psychotic features) - Increase Zyprexa to 20m qam and 174mqhs given recent need for PRNs yesterday -  Continue Cogentin 0.54m2mid PRN  - Agitation Protocol: Haldol 54mg17m or po q8h PRN, Ativan 1mg 30mor 2mg I53m 8 hours PRN, Benadryl 50mg I17m po q8 hours PRN -- New onset psychosis w/u: Negative noncontrast head CT on 11/13/2021, Hep C Ab positive; ESR 2, ceruloplasmin 20.5, HIV nonreactive, B12 348, PR nonreactive, ANA negative, TSH 0.667, UDS positive for THC, alcohol less than 10, BMP within normal limits other than a glucose of 123, white count 13.1 trending down to  10.2, AST elevated at 62 and ALT elevated at 103 - Labs Pending:  Heavy metals,  B1, Hep C quant --Antipsychotic lab monitoring: EKG: QTC- 432, A1c 5.5, Lipids WNL  -- Encouraged patient to participate in unit milieu and in scheduled group therapies    EtoH use (r/o alcohol use d/o) Cocaine use - episodic (r/o stimulant use d/o) 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 Medical Issues Being Addressed:   Tobacco Use Disorder  -- Nicotine patch 21mg/2470mrs ordered  -- Smoking cessation encouraged   Hx of Gastric Ulcers -- Continue home Pepcid 20mg BID14mp C ab positive/Transaminitis -- Hep C RNA Quant lab pending, will need f/u OP-- AST trending up to 62 and ALT up to 103 (has been at AST 155 and ALT 228 2 years ago) elevated total bili 1.4 and indirect bili 1.3 - continue to trend and increasing further will need RUQ u/s   Leukocytosis - resolved -- Repeat CBC shows WBC 10.2 on 11/17/21  4. Discharge Planning:   -- Social work and case management to assist with discharge planning and identification of hospital follow-up needs prior to discharge  -- Discharge Concerns: Need to establish a safety plan; Medication compliance and effectiveness  -- Discharge Goals: Return home with outpatient referrals for mental health follow-up including medication management/psychotherapy  Cassadie Pankonin E SinHarlow AsaA 11/18/2021, 1:12 PM

## 2021-11-18 NOTE — Group Note (Signed)
°  [facility] LCSW Group Therapy Note  Date/Time:  11/18/2021 11:15AM-12:00PM  Type of Therapy and Topic:  Group Therapy:  Feelings About Hospitalization  Participation Level:  Minimal   Description of Group This process group involved patients discussing their feelings related to being hospitalized, as well as the benefits they see to being in the hospital.  These feelings and benefits were itemized.  The group then brainstormed specific ways in which they could seek those same benefits when they discharge and return home.  Therapeutic Goals Patient will identify and describe positive and negative feelings related to hospitalization Patient will verbalize benefits of hospitalization to themselves personally Patients will brainstorm together ways they can obtain similar benefits in the outpatient setting, identify barriers to wellness and possible solutions  Summary of Patient Progress:  The patient expressed his primary feelings about being hospitalized are good because it has gotten him "off the street" and "clean for awhile."  He stated he needs to stay away from bad people, then immediately said he does not hang around any bad people.  He feels that one positive thing about being in the hospital is being given the time to get his thoughts together.  One way he will stay sober once he is discharged is to keep his mind busy and to stay physically busy with work.  He was quite agitated throughout the second half of group, kept standing and indicating that he was going to leave the room, then he would sit back down.  CSW asked him a couple of questions and he answered abruptly, again as though agitated or angry.  He eventually did the leave the room, but returned shortly thereafter.  Therapeutic Modalities Cognitive Behavioral Therapy Motivational Interviewing    Ambrose Mantle, LCSW 11/18/2021, 1:37 PM

## 2021-11-18 NOTE — Progress Notes (Signed)
°   11/18/21 1400  Psych Admission Type (Psych Patients Only)  Admission Status Involuntary  Psychosocial Assessment  Patient Complaints Suspiciousness  Eye Contact Fair  Facial Expression Animated;Anxious;Worried  Affect Anxious;Irritable;Preoccupied  Speech Rapid;Tangential  Interaction Assertive  Motor Activity Restless  Appearance/Hygiene Disheveled;In scrubs  Behavior Characteristics Cooperative  Mood Preoccupied  Thought Process  Coherency Circumstantial;Disorganized;Flight of ideas;Loose associations;Tangential  Content Blaming others;Obsessions;Preoccupation;Paranoia  Delusions Paranoid  Perception Hallucinations  Hallucination Auditory;Visual  Judgment Impaired  Confusion Mild  Danger to Self  Current suicidal ideation? Denies  Danger to Others  Danger to Others None reported or observed

## 2021-11-18 NOTE — Progress Notes (Signed)
Pt not sleeping much this evening, pt began hollering out and banging in his room, pt appeared agitated, so pt given PRN Benadryl, Ativan and Haldol per [facility]

## 2021-11-18 NOTE — Progress Notes (Signed)
Patient did not attend wrap up group. 

## 2021-11-18 NOTE — [facility] Group Notes (Signed)
Goals Group °11/18/21 ° ° ° °Group Focus: affirmation, clarity of thought, and goals/reality orientation °Treatment Modality:  Psychoeducation °Interventions utilized were assignment, group exercise, and support °Purpose: To be able to understand and verbalize the reason for their admission to the hospital. To understand that the medication helps with their chemical imbalance but they also need to work on their choices in life. To be challenged to develop a list of 30 positives about themselves. Also introduce the concept that "feelings" are not reality. ° °Participation Level:  did not attend ° ° °Krystle Polcyn A °

## 2021-11-18 NOTE — [facility] Group Notes (Signed)
Chaplain received a referral from staff member that patient wanted to speak with a priest.  Jeffrey Holland attempted twice to see patient, but he was asleep.  Chaplain will attempt follow up next week, but please also page if needs arise.  Chaplain Dyanne Carrel, Bcc Pager, [phone] 7:54 PM

## 2021-11-18 NOTE — Progress Notes (Signed)
°   11/18/21 0500  Sleep  Number of Hours 3.25

## 2021-11-18 NOTE — Progress Notes (Signed)
Pt was encouraged but didn't attend orientation/goals group. ?

## 2021-11-19 DIAGNOSIS — F29 Unspecified psychosis not due to a substance or known physiological condition: Secondary | ICD-10-CM | POA: Diagnosis not present

## 2021-11-19 LAB — VITAMIN B1: Vitamin B1 (Thiamine): 125.3 nmol/L (ref 66.5–200.0)

## 2021-11-19 LAB — HCV RNA QUANT
HCV Quantitative Log: 5.548 log10 IU/mL (ref >1.70)
HCV Quantitative: 353000 IU/mL (ref >50)

## 2021-11-19 MED ORDER — ALBUTEROL SULFATE HFA 108 (90 BASE) MCG/ACT IN AERS
2.0000 | INHALATION_SPRAY | Freq: Four times a day (QID) | RESPIRATORY_TRACT | Status: DC | PRN
  Administered 2021-12-01 – 2021-12-03 (×4): 2 via RESPIRATORY_TRACT
  Filled 2021-11-19: qty 6.7

## 2021-11-19 MED ORDER — TRAZODONE HCL 100 MG PO TABS
100.0000 mg | ORAL_TABLET | Freq: Every evening | ORAL | Status: DC | PRN
  Administered 2021-11-19 – 2021-11-25 (×7): 100 mg via ORAL
  Filled 2021-11-19 (×7): qty 1

## 2021-11-19 MED ORDER — HALOPERIDOL 5 MG PO TABS
5.0000 mg | ORAL_TABLET | Freq: Two times a day (BID) | ORAL | Status: DC
  Administered 2021-11-19 – 2021-11-20 (×3): 5 mg via ORAL
  Filled 2021-11-19 (×6): qty 1

## 2021-11-19 NOTE — Progress Notes (Signed)
°  On assessment, pt is in bed awake.  Pt explains he is unable to get comfortable in the bed.  Pt endorses having, "racing thoughts; future thoughts; can't get them to slow down."  Pt endorses VH stating, "my head is creating a lot; my eyes are rolling and when I sleep, I see all kinds of visions."  Pt further states, "I had a dream one time of electricity frying me in my bed."  Pt is visibly seen having AH, making conversations and responding to the voices and even attempting to pull writer into the conversation.  Pt denies SI/HI and verbally contracts for safety.  Medication administered.    At 2058 - pt administered PRN Trazodone for sleep per [facility] per pt request.  At 2311 - MHT reports pt is curing in his room.  Pt has become highly agitated and states, "I'm hungry."  Pt administered PRN Haldol and Benadryl for agitation per [facility] per pt request.  At 11/19/21, 0002 - Pt is up demanding to speak with someone.  Pt states, "I'm going to sign myself out of here; I'm hungry; I need to work; I can walk out of here right now if I want to."  Redirected pt.  Administered PRN Ativan for agitation per [facility] per pt request.  Snacks and fluids provided to pt.  Pt is safe on unit with Q 15 minute safety checks.     11/18/21 2057  Psych Admission Type (Psych Patients Only)  Admission Status Involuntary  Psychosocial Assessment  Patient Complaints Anxiety;Agitation;Anger;Crying spells;Restlessness  Eye Contact Fair  Facial Expression Animated;Anxious;Worried  Affect Anxious;Irritable;Preoccupied  Speech Rapid;Tangential  Interaction Assertive  Motor Activity Restless  Appearance/Hygiene Disheveled;In scrubs  Behavior Characteristics Cooperative  Mood Anxious  Thought Process  Coherency Circumstantial;Disorganized;Flight of ideas;Loose associations;Tangential  Content Blaming others;Obsessions;Preoccupation;Paranoia  Delusions Paranoid  Perception Hallucinations  Hallucination Auditory;Visual   Judgment Impaired  Confusion Mild  Danger to Self  Current suicidal ideation? Denies  Danger to Others  Danger to Others None reported or observed

## 2021-11-19 NOTE — Plan of Care (Signed)
?  Problem: Education: ?Goal: Emotional status will improve ?Outcome: Not Progressing ?Goal: Mental status will improve ?Outcome: Not Progressing ?  ?Problem: Activity: ?Goal: Sleeping patterns will improve ?Outcome: Not Progressing ?  ?

## 2021-11-19 NOTE — Progress Notes (Signed)
Pt was encouraged but didn't attend orientation/goals group. ?

## 2021-11-19 NOTE — [facility] Group Notes (Signed)
Adult Psychoeducational Group Not Date:  11/19/2021 Time:  0900-1045 Group Topic/Focus: PROGRESSIVE RELAXATION. A group where deep breathing is taught and tensing and relaxation muscle groups is used. Imagery is used as well.  Pts are asked to imagine 3 pillars that hold them up when they are not able to hold themselves up.  Participation Level:  Active  Participation Quality:  Appropriate  Affect:  Appropriate  Cognitive:  Oriented  Insight: Improving  Engagement in Group:  Engaged  Modes of Intervention:  Activity, Discussion, Education, and Support  Additional Comments:  Pt rates his energy level as a 10/10. States his mother, father and his boss are his pillars.   Dione Housekeeper

## 2021-11-19 NOTE — Progress Notes (Addendum)
[facility] MD Progress Note  11/19/2021 7:56 AM Jeffrey Holland  MRN:  [id number]  Chief Complaint: psychosis  Reason for Admission:   Jeffrey Holland is a 36 y.o. male with no known past psychiatric history, who was initially admitted for inpatient psychiatric hospitalization on 11/15/2021 for management of hallucinations and delusions.  The patient is currently on Hospital Day 4.   Chart Review from last 24 hours:  The patient's chart was reviewed and nursing notes were reviewed. The patient's case was discussed in multidisciplinary team meeting. Per nursing, patient reported AVH and was seen responding to internal stimuli on night shift. He required Haldol and Benadryl po for agitation around 2300. He required Ativan po for agitation at 0002. He attended 1 group yesterday. Per [facility] he was complaint with scheduled medications. He was given Trazodone PRN for sleep.  Information Obtained Today During Patient Interview: The patient was seen and evaluated on the unit.  He was sleeping this afternoon after lunch and was encouraged to be awake more during the day to avoid difficulty with insomnia at night.  He denies SI or HI.  He denies belief that someone is trying to kill him or that anyone is after him on the inpatient unit today.  He describes having vivid dreams last night but when questioned as to the reason why he needed PRN medications for agitation last night,he states he does not recall events.  He admits to residual auditory hallucinations of an unrecognized voice telling him "it is going to be okay" but states the voices are not command in nature.  He states that he still has visual hallucinations of "things around the world like islands."  He goes on to describe belief that "my eyes run to other sides of the world." He denies ideas of reference. He denies belief that he has tracers in his eyes or is implanted with any device.  He states that his "psychic powers" seem to be reducing on his current  medication regimen.  He denies belief in thought insertion/withdrawal or broadcasting.  He voices no physical complaints and reports stable appetite.  He requests a PRN inhaler for asthma to use as needed on the unit but denies current issues with SOB or CP.  Principal Problem: Psychosis, unspecified psychosis type ([place]) Diagnosis: Principal Problem:   Psychosis, unspecified psychosis type ([place]) Active Problems:   Alcohol abuse   Cannabis abuse   Cocaine abuse, episodic use ([place])  Total Time spent with patient: I personally spent 30 minutes on the unit in direct patient care. The direct patient care time included face-to-face time with the patient, reviewing the patient's chart, communicating with other professionals, and coordinating care. Greater than 50% of this time was spent in counseling or coordinating care with the patient regarding goals of hospitalization, psycho-education, and discharge planning needs.  Past Psychiatric History:  See H & P  Past Medical History:  Past Medical History:  Diagnosis Date   Anxiety    Asthma    Panic     Family History: see H&P  Family Psychiatric  History:  See H&P  Social History:  Social History   Substance and Sexual Activity  Alcohol Use Yes     Social History   Substance and Sexual Activity  Drug Use Yes   Types: Marijuana, Methamphetamines, Cocaine    Social History   Socioeconomic History   Marital status: Single    Spouse name: Not on file   Number of children: Not on file  Years of education: Not on file   Highest education level: Not on file  Occupational History   Not on file  Tobacco Use   Smoking status: Every Day    Packs/day: 1.00    Types: Cigarettes   Smokeless tobacco: Not on file  Vaping Use   Vaping Use: Every day  Substance and Sexual Activity   Alcohol use: Yes   Drug use: Yes    Types: Marijuana, Methamphetamines, Cocaine   Sexual activity: Yes    Birth control/protection: None  Other Topics  Concern   Not on file  Social History Narrative   ** Merged History Encounter **       Social Determinants of Health   Financial Resource Strain: Not on file  Food Insecurity: Not on file  Transportation Needs: Not on file  Physical Activity: Not on file  Stress: Not on file  Social Connections: Not on file   Sleep: Noted to be sleeping during the day at times; overnight sleep hours unrecorded  Appetite: Improving  Current Medications: Current Facility-Administered Medications  Medication Dose Route Frequency Provider Last Rate Last Admin   acetaminophen (TYLENOL) tablet 650 mg  650 mg Oral Q6H PRN Prescilla Sours, PA-C       alum & mag hydroxide-simeth (MAALOX/MYLANTA) 200-200-20 MG/5ML suspension 30 mL  30 mL Oral Q4H PRN Lovena Le, [name], PA-C       benztropine (COGENTIN) tablet 0.5 mg  0.5 mg Oral BID PRN Harlow Asa, MD       diphenhydrAMINE (BENADRYL) capsule 50 mg  50 mg Oral Q8H PRN Margorie John W, PA-C   50 mg at 11/18/21 2311   Or   diphenhydrAMINE (BENADRYL) injection 50 mg  50 mg Intramuscular Q8H PRN Margorie John W, PA-C       famotidine (PEPCID) tablet 20 mg  20 mg Oral BID Damita Dunnings B, MD   20 mg at 11/18/21 1715   haloperidol (HALDOL) tablet 5 mg  5 mg Oral Q8H PRN Prescilla Sours, PA-C   5 mg at 11/18/21 2311   Or   haloperidol lactate (HALDOL) injection 5 mg  5 mg Intramuscular Q8H PRN Margorie John W, PA-C       hydrOXYzine (ATARAX) tablet 25 mg  25 mg Oral Q6H PRN Nelda Marseille, Asmar Brozek E, MD       loperamide (IMODIUM) capsule 2-4 mg  2-4 mg Oral PRN Harlow Asa, MD       LORazepam (ATIVAN) tablet 1 mg  1 mg Oral Q8H PRN Viann Fish E, MD   1 mg at 11/19/21 0002   Or   LORazepam (ATIVAN) injection 2 mg  2 mg Intramuscular Q8H PRN Harlow Asa, MD       LORazepam (ATIVAN) tablet 1 mg  1 mg Oral Q6H PRN Nelda Marseille, Chace Bisch E, MD       magnesium hydroxide (MILK OF MAGNESIA) suspension 30 mL  30 mL Oral Daily PRN Margorie John W, PA-C       multivitamin with  minerals tablet 1 tablet  1 tablet Oral Daily Harlow Asa, MD   1 tablet at 11/18/21 0817   nicotine polacrilex (NICORETTE) gum 2 mg  2 mg Oral PRN Harlow Asa, MD   2 mg at 11/16/21 0752   OLANZapine (ZYPREXA) tablet 10 mg  10 mg Oral Daily Damita Dunnings B, MD   10 mg at 11/18/21 0817   OLANZapine (ZYPREXA) tablet 15 mg  15 mg Oral QHS Nelda Marseille,  Ndrew Creason E, MD   15 mg at 11/18/21 2057   ondansetron (ZOFRAN-ODT) disintegrating tablet 4 mg  4 mg Oral Q6H PRN Harlow Asa, MD       thiamine tablet 100 mg  100 mg Oral Daily Nelda Marseille, Makenlee Mckeag E, MD   100 mg at 11/18/21 0817   traZODone (DESYREL) tablet 50 mg  50 mg Oral QHS PRN Prescilla Sours, PA-C   50 mg at 11/18/21 2058    Lab Results:  No results found for this or any previous visit (from the past 62 hour(s)).   Blood Alcohol level:  Lab Results  Component Value Date   ETH <10 11/14/2021   ETH <10 93/71/6967    Metabolic Disorder Labs: Lab Results  Component Value Date   HGBA1C 5.5 11/15/2021   MPG 111.15 11/15/2021   No results found for: PROLACTIN Lab Results  Component Value Date   CHOL 151 11/15/2021   TRIG 98 11/15/2021   HDL 42 11/15/2021   CHOLHDL 3.6 11/15/2021   VLDL 20 11/15/2021   LDLCALC 89 11/15/2021    Physical Findings: AIMS: Facial and Oral Movements Muscles of Facial Expression: None, normal Lips and Perioral Area: None, normal Jaw: None, normal Tongue: None, normal,Extremity Movements Upper (arms, wrists, hands, fingers): None, normal Lower (legs, knees, ankles, toes): None, normal, Trunk Movements Neck, shoulders, hips: None, normal, Overall Severity Severity of abnormal movements (highest score from questions above): None, normal Incapacitation due to abnormal movements: None, normal Patient's awareness of abnormal movements (rate only patient's report): No Awareness, Dental Status Current problems with teeth and/or dentures?: No Does patient usually wear dentures?: No  CIWA:  CIWA-Ar  Total: 9  Musculoskeletal: Strength & Muscle Tone: within normal limits Gait & Station: normal Patient leans: N/A  Psychiatric Specialty Exam:  Presentation  General Appearance: fair hygiene, appears older than stated age   Eye Contact: Fleeting   Speech:Clear and Coherent    Speech Volume:Normal   Handedness:Right     Mood and Affect  Mood:Aloof, sedated   Affect: sedated appearing, less paranoid, no longer labile     Thought Process  Thought Processes:concrete, linear for most of interview with some tangential thinking   Duration of Psychotic Symptoms: Less than six months   Past Diagnosis of Schizophrenia or Psychoactive disorder: No   Orientation:Oriented to self, month and county but not situation or year   Thought Content: He reports some residual auditory/visual hallucinations and delusional beliefs that he is able to have psychic powers and see things around the world.  He denies ideas of reference, first rank symptoms SI or HI.  He is not grossly responding to internal or external stimuli during assessment.   Hallucinations: AH of voice saying "its going to be okay" and VH of islands and locations around the world   Ideas of Reference:Denied   Suicidal Thoughts:Suicidal Thoughts: No   Homicidal Thoughts:Homicidal Thoughts: No     Sensorium  Memory:Limited secondary to psychosis   Judgment:Impaired   Insight: Poor     Executive Functions  Concentration: Poor   Attention Span:Poor   [place] secondary to psychosis   Language:Fair     Psychomotor Activity  Psychomotor Activity:Sedated appearing today - resting in bed     Assets  Assets:Resilience     Physical Exam HENT:     Head: Normocephalic and atraumatic.  Pulmonary:     Effort: Pulmonary effort is normal.  Neurological:     General: No focal  deficit present.     Mental Status: He is alert.   Review of Systems  Respiratory:  Negative for  shortness of breath.   Cardiovascular:  Negative for chest pain.  Gastrointestinal:  Negative for constipation, diarrhea, nausea and vomiting.  Blood pressure 115/84, pulse 89, temperature 97.7 F (36.5 C), temperature source Oral, resp. rate 16, height 5' 6"  (1.676 m), weight 62.6 kg, SpO2 92 %. Body mass index is 22.27 kg/m.   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 ( R/o Substance induced psychosis vs MDD, recurrent, severe w/ psychotic features vs Schizophreniform disorder vs Bipolar disorder, current episode depressed w/ psychotic features) - Continue Zyprexa 48m qam and 127mqhs  - Start Haldol 33m51mid for residual hallucinations -  Continue Cogentin 0.33mg24md PRN  - Agitation Protocol: Haldol 33mg 34mor po q8h PRN, Ativan 1mg p34mr 2mg IM59m8 hours PRN, Benadryl 50mg IM8mpo q8 hours PRN -- New onset psychosis w/u: Negative noncontrast head CT on 11/13/2021, Hep C Ab positive; ESR 2, ceruloplasmin 20.5, HIV nonreactive, B12 348, PR nonreactive, ANA negative, TSH 0.667, UDS positive for THC, alcohol less than 10, BMP within normal limits other than a glucose of 123, white count 13.1 trending down to 10.2, AST elevated at 62 and ALT elevated at 103, B1 125.3- Labs  Pending:  Heavy metals and Hep C quant --Antipsychotic lab monitoring: EKG: QTC- 432, A1c 5.5, Lipids WNL- rechecking EKG for QTC monitoring  -- Increase Trazodone to 100mg qhs77m insomnia and improved daytime sleep hygiene encouraged  -- Encouraged patient to participate in unit milieu and in scheduled group therapies    EtoH use (r/o alcohol use d/o) Cocaine use - episodic (r/o stimulant use d/o) 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 Medical Issues Being Addressed:   Tobacco Use Disorder  -- Nicotine patch 21mg/24 h6m ordered  -- Smoking cessation encouraged   Hx of Gastric Ulcers -- Continue home Pepcid 20mg BID  34mC ab positive/Transaminitis -- Hep C RNA Quant lab pending, will need f/u OP-- AST trending up to 62 and ALT up to 103 (has been at AST 155 and ALT 228 2 years ago) elevated total bili 1.4 and indirect bili 1.3 - continue to trend with repeat lab tomorrow, and if increasing further will need GI consult and possible RUQ u/s   Leukocytosis - resolved -- Repeat CBC shows WBC 10.2 on 11/17/21  4. Discharge Planning:   -- Social work and case management to assist with discharge planning and identification of hospital follow-up needs prior to discharge  -- Discharge Concerns: Need to establish a safety plan; Medication compliance and effectiveness  --  Discharge Goals: Return home with outpatient referrals for mental health follow-up including medication management/psychotherapy  Harlow Asa, MD, FAPA 11/19/2021, 7:56 AM

## 2021-11-19 NOTE — Progress Notes (Signed)
D:  Jadyne was isolative to his room this evening.  He was asleep at shift change and did not get up for evening wrap up group.  He did get up for snack and allowed RN to obtain EKG.  He denied SI/HI.  He continues to admit to non command AVH.  He agrees to come to staff if that worsens. Midnight CIWA not completed due to him being asleep.  He appears to be in no physical distress.   A:  1:1 with RN for support and encouragement.  Medications given as ordered.  Q 15 minute checks maintained for safety.  Encouraged participation in group and unit activities.   R:  He is currently resting with his eyes closed and appears to be asleep.  He remains safe on the unit.  We will continue to monitor the progress towards his goals.

## 2021-11-19 NOTE — Progress Notes (Signed)
Patient denies SI, HI and AVH this shift. Patient attended groups was compliant with medications.  Patient had no incidents of behavioral dyscontrol this shift.  °Monitor patient for safety, offer medications as prescribed, engage patient 1:1 staff talks.  °Patient able to contract for safety. Continue to monitor as planned.  °

## 2021-11-19 NOTE — Progress Notes (Signed)
EKG completed and placed on the front of the chart  

## 2021-11-19 NOTE — Group Note (Signed)
°  [facility] LCSW Group Therapy Note  Date/Time:  11/19/2021 10:00AM-11:00AM  Type of Therapy and Topic:  Group Therapy:  Self-Care after Hospitalization  Participation Level:  Did Not Attend   Description of Group This process group involved patients discussing how they plan to take care of themselves in a better manner when they get home from the hospital.  The group started with patients listing one healthy and one unhealthy way they took care of themselves prior to hospitalization.  A discussion ensued about the differences in healthy and unhealthy coping skills.  Group members shared ideas about making changes when they return home so that they can stay well and in recovery.  The white board was used to list ideas so that patients can continue to see these ideas throughout the day.  Therapeutic Goals Patient will identify and describe one healthy and one unhealthy coping technique used prior to hospitalization Patient will participate in generating ideas about healthy self-care options when they return to the community Patients will be supportive of one another and receive said support from others Patient will identify one healthy self-care activity to add to his/her post-hospitalization life that can help in recovery  Summary of Patient Progress:  The patient was invited to group; did not attend.   Therapeutic Modalities Brief Solution-Focused Therapy Motivational Interviewing Psychoeducation       [employer], Latanya Presser 11/19/2021  2:18 PM

## 2021-11-20 ENCOUNTER — Encounter (HOSPITAL_COMMUNITY): Payer: Self-pay

## 2021-11-20 DIAGNOSIS — F29 Unspecified psychosis not due to a substance or known physiological condition: Secondary | ICD-10-CM | POA: Diagnosis not present

## 2021-11-20 DIAGNOSIS — B1920 Unspecified viral hepatitis C without hepatic coma: Secondary | ICD-10-CM | POA: Diagnosis present

## 2021-11-20 LAB — HEPATIC FUNCTION PANEL
ALT: 109 U/L — ABNORMAL HIGH (ref 0–44)
AST: 94 U/L — ABNORMAL HIGH (ref 15–41)
Albumin: 4.4 g/dL (ref 3.5–5.0)
Alkaline Phosphatase: 52 U/L (ref 38–126)
Bilirubin, Direct: 0.1 mg/dL (ref 0.0–0.2)
Indirect Bilirubin: 0.8 mg/dL (ref 0.3–0.9)
Total Bilirubin: 0.9 mg/dL (ref 0.3–1.2)
Total Protein: 7.9 g/dL (ref 6.5–8.1)

## 2021-11-20 MED ORDER — HALOPERIDOL 5 MG PO TABS
10.0000 mg | ORAL_TABLET | Freq: Every day | ORAL | Status: DC
  Administered 2021-11-20 – 2021-11-21 (×2): 10 mg via ORAL
  Filled 2021-11-20 (×3): qty 2

## 2021-11-20 MED ORDER — OLANZAPINE 7.5 MG PO TABS
15.0000 mg | ORAL_TABLET | Freq: Every day | ORAL | Status: AC
  Administered 2021-11-20: 15 mg via ORAL
  Filled 2021-11-20: qty 2

## 2021-11-20 MED ORDER — HALOPERIDOL 5 MG PO TABS
5.0000 mg | ORAL_TABLET | Freq: Every day | ORAL | Status: DC
  Administered 2021-11-21 – 2021-11-26 (×6): 5 mg via ORAL
  Filled 2021-11-20 (×7): qty 1

## 2021-11-20 NOTE — Hospital Course (Addendum)
°  Spoke w/ mom and dad today: Parents reports that they can see a positive difference but endorses that the patient is still paranoid and emotionally labile. Mom endorses that she has visited patient and he is not remembering her visits. Mom confirms that both dad and deceased brother had Bipolar disorder and dad took Lithium in the past.  Would like SW assistance to get OP GI appt    [facility] on [address]   Spoke with mom and dad prior to patient discharge about patient medications. Patient and parents were educated separately on EPS symptoms to look for. Parents and patient were educated that patient will receive a Cogentin 0.5mg  BID PRN prescription should these symptoms arise and that patient should be taken to the ED for evaluation. Patient and parents indicated understanding. Patient and parents were also separately educated on patient's need for hydration while on Lithium especially in the setting of being acutely ill or working out. Patient and parents were educated on symptoms to look out for and that if these symptoms are seen patient will need to go to ED. Patient and parents indicated understanding. Patient and parents were also educated that patient will likely need his A1c, Lipids, TSH, and WBC monitored as well while he is on these medications. Patient and parents indicated understanding and parents endorsed they intended to make sure patient was compliant with visits. Patient and parents were also educated that patient has a OP PCP appt to f/u regarding his Hepatitis C.

## 2021-11-20 NOTE — Progress Notes (Signed)
Pt up requesting something to help him sleep, pt appeared sleepy , pt given PRN Benadryl and encouraged to lay down and he should fall back to sleep

## 2021-11-20 NOTE — BH IP Treatment Plan (Signed)
Interdisciplinary Treatment and Diagnostic Plan Update ° °11/20/2021 °Time of Session: 11:00am °Jeffrey Holland °MRN: [id number] ° °Principal Diagnosis: Psychosis, unspecified psychosis type (HCC) ° °Secondary Diagnoses: Principal Problem: °  Psychosis, unspecified psychosis type (HCC) °Active Problems: °  Alcohol abuse °  Cannabis abuse °  Cocaine abuse, episodic use (HCC) ° ° °Current Medications:  °Current Facility-Administered Medications  °Medication Dose Route Frequency Provider Last Rate Last Admin  ° acetaminophen (TYLENOL) tablet 650 mg  650 mg Oral Q6H PRN Taylor, [name], PA-C      ° albuterol (VENTOLIN HFA) 108 (90 Base) MCG/ACT inhaler 2 puff  2 puff Inhalation Q6H PRN Singleton, Amy E, MD      ° alum & mag hydroxide-simeth (MAALOX/MYLANTA) 200-200-20 MG/5ML suspension 30 mL  30 mL Oral Q4H PRN Taylor, [name], PA-C      ° benztropine (COGENTIN) tablet 0.5 mg  0.5 mg Oral BID PRN Singleton, Amy E, MD      ° diphenhydrAMINE (BENADRYL) capsule 50 mg  50 mg Oral Q8H PRN Taylor, [name], PA-C   50 mg at 11/18/21 2311  ° Or  ° diphenhydrAMINE (BENADRYL) injection 50 mg  50 mg Intramuscular Q8H PRN Taylor, [name], PA-C   50 mg at 11/19/21 1255  ° famotidine (PEPCID) tablet 20 mg  20 mg Oral BID McQuilla, Jai B, MD   20 mg at 11/20/21 0820  ° haloperidol (HALDOL) tablet 10 mg  10 mg Oral Q2000 McQuilla, Jai B, MD      ° haloperidol (HALDOL) tablet 5 mg  5 mg Oral Q8H PRN Taylor, [name], PA-C   5 mg at 11/18/21 2311  ° Or  ° haloperidol lactate (HALDOL) injection 5 mg  5 mg Intramuscular Q8H PRN Taylor, [name], PA-C   5 mg at 11/19/21 1255  ° [START ON 11/21/2021] haloperidol (HALDOL) tablet 5 mg  5 mg Oral Q breakfast McQuilla, Jai B, MD      ° hydrOXYzine (ATARAX) tablet 25 mg  25 mg Oral Q6H PRN Singleton, Amy E, MD      ° loperamide (IMODIUM) capsule 2-4 mg  2-4 mg Oral PRN Singleton, Amy E, MD      ° LORazepam (ATIVAN) tablet 1 mg  1 mg Oral Q8H PRN Singleton, Amy E, MD   1 mg at 11/19/21 0002  ° Or  °  LORazepam (ATIVAN) injection 2 mg  2 mg Intramuscular Q8H PRN Singleton, Amy E, MD   2 mg at 11/19/21 1255  ° LORazepam (ATIVAN) tablet 1 mg  1 mg Oral Q6H PRN Singleton, Amy E, MD      ° magnesium hydroxide (MILK OF MAGNESIA) suspension 30 mL  30 mL Oral Daily PRN Taylor, [name], PA-C      ° multivitamin with minerals tablet 1 tablet  1 tablet Oral Daily Singleton, Amy E, MD   1 tablet at 11/20/21 0821  ° nicotine polacrilex (NICORETTE) gum 2 mg  2 mg Oral PRN Singleton, Amy E, MD   2 mg at 11/19/21 1110  ° OLANZapine (ZYPREXA) tablet 10 mg  10 mg Oral Daily McQuilla, Jai B, MD   10 mg at 11/20/21 0820  ° OLANZapine (ZYPREXA) tablet 15 mg  15 mg Oral QHS McQuilla, Jai B, MD      ° ondansetron (ZOFRAN-ODT) disintegrating tablet 4 mg  4 mg Oral Q6H PRN Singleton, Amy E, MD      ° thiamine tablet 100 mg  100 mg Oral Daily Singleton, Amy   E, MD   100 mg at 11/20/21 0821   traZODone (DESYREL) tablet 100 mg  100 mg Oral QHS PRN Harlow Asa, MD   100 mg at 11/19/21 2129   PTA Medications: Medications Prior to Admission  Medication Sig Dispense Refill Last Dose   famotidine (PEPCID) 20 MG tablet Take 1 tablet (20 mg total) by mouth 2 (two) times daily. (Patient not taking: Reported on 01/06/2016) 30 tablet 0    lansoprazole (PREVACID) 30 MG capsule Take 1 capsule (30 mg total) by mouth daily at 12 noon. (Patient not taking: Reported on 08/19/2019) 30 capsule 0    ondansetron (ZOFRAN) 4 MG tablet Take 1 tablet (4 mg total) by mouth every 8 (eight) hours as needed for nausea or vomiting. (Patient not taking: Reported on 08/19/2019) 12 tablet 0    sucralfate (CARAFATE) 1 g tablet Take 1 tablet (1 g total) by mouth 3 (three) times daily as needed (epigastric pain). (Patient not taking: Reported on 08/19/2019) 30 tablet 0     Patient Stressors: Marital or family conflict   Medication change or noncompliance    Patient Strengths: Psychologist, clinical for treatment/growth   Treatment Modalities:  Medication Management, Group therapy, Case management,  1 to 1 session with clinician, Psychoeducation, Recreational therapy.   Physician Treatment Plan for Primary Diagnosis: Psychosis, unspecified psychosis type ([place]) Long Term Goal(s): Improvement in symptoms so as ready for discharge   Short Term Goals: Ability to identify changes in lifestyle to reduce recurrence of condition will improve Ability to verbalize feelings will improve Ability to disclose and discuss suicidal ideas Ability to identify triggers associated with substance abuse/mental health issues will improve  Medication Management: Evaluate patient's response, side effects, and tolerance of medication regimen.  Therapeutic Interventions: 1 to 1 sessions, Unit Group sessions and Medication administration.  Evaluation of Outcomes: Not Met  Physician Treatment Plan for Secondary Diagnosis: Principal Problem:   Psychosis, unspecified psychosis type ([place]) Active Problems:   Alcohol abuse   Cannabis abuse   Cocaine abuse, episodic use ([place])  Long Term Goal(s): Improvement in symptoms so as ready for discharge   Short Term Goals: Ability to identify changes in lifestyle to reduce recurrence of condition will improve Ability to verbalize feelings will improve Ability to disclose and discuss suicidal ideas Ability to identify triggers associated with substance abuse/mental health issues will improve     Medication Management: Evaluate patient's response, side effects, and tolerance of medication regimen.  Therapeutic Interventions: 1 to 1 sessions, Unit Group sessions and Medication administration.  Evaluation of Outcomes: Not Met   RN Treatment Plan for Primary Diagnosis: Psychosis, unspecified psychosis type ([place]) Long Term Goal(s): Knowledge of disease and therapeutic regimen to maintain health will improve  Short Term Goals: Ability to remain free from injury will improve, Ability to verbalize frustration and  anger appropriately will improve, Ability to demonstrate self-control, Ability to participate in decision making will improve, Ability to verbalize feelings will improve, Ability to disclose and discuss suicidal ideas, Ability to identify and develop effective coping behaviors will improve, and Compliance with prescribed medications will improve  Medication Management: RN will administer medications as ordered by provider, will assess and evaluate patient's response and provide education to patient for prescribed medication. RN will report any adverse and/or side effects to prescribing provider.  Therapeutic Interventions: 1 on 1 counseling sessions, Psychoeducation, Medication administration, Evaluate responses to treatment, Monitor vital signs and CBGs as ordered, Perform/monitor CIWA, COWS, AIMS and Fall  Risk screenings as ordered, Perform wound care treatments as ordered.  Evaluation of Outcomes: Not Met   LCSW Treatment Plan for Primary Diagnosis: Psychosis, unspecified psychosis type ([place]) Long Term Goal(s): Safe transition to appropriate next level of care at discharge, Engage patient in therapeutic group addressing interpersonal concerns.  Short Term Goals: Engage patient in aftercare planning with referrals and resources, Increase social support, Increase ability to appropriately verbalize feelings, Increase emotional regulation, Facilitate acceptance of mental health diagnosis and concerns, Identify triggers associated with mental health/substance abuse issues, and Increase skills for wellness and recovery  Therapeutic Interventions: Assess for all discharge needs, 1 to 1 time with Social worker, Explore available resources and support systems, Assess for adequacy in community support network, Educate family and significant other(s) on suicide prevention, Complete Psychosocial Assessment, Interpersonal group therapy.  Evaluation of Outcomes: Not Met   Progress in Treatment: Attending  groups: Yes. Participating in groups: No. Taking medication as prescribed: Yes. Toleration medication: Yes. Family/Significant other contact made: Yes, individual(s) contacted:  mother Patient understands diagnosis: No. Discussing patient identified problems/goals with staff: Yes. Medical problems stabilized or resolved: Yes. Denies suicidal/homicidal ideation: Yes. Issues/concerns per patient self-inventory: No.   New problem(s) identified: No, Describe:  none  New Short Term/Long Term Goal(s): detox, medication management for mood stabilization; elimination of SI thoughts; development of comprehensive mental wellness/sobriety plan  Patient Goals:  "To stop all bad thoughts and look forward to the future"   Discharge Plan or Barriers: Pt is to return home to stay with parents. Pt is to follow up at [facility] for therapy and medication management  Reason for Continuation of Hospitalization: Anxiety Delusions  Hallucinations Medication stabilization  Estimated Length of Stay: 3-5 days   Scribe for Treatment Team: Vassie Moselle, LCSW 11/20/2021 11:09 AM

## 2021-11-20 NOTE — Progress Notes (Signed)
Pt stated he needed a lawyer , Clinical research associate asked what for, pt continues to be paranoid and disorganized at times. Pt continues to believe he is going to die    11/20/21 2100  Psych Admission Type (Psych Patients Only)  Admission Status Involuntary  Psychosocial Assessment  Patient Complaints Insomnia  Eye Contact Brief  Facial Expression Sad  Affect Anxious  Speech Tangential  Interaction Assertive  Motor Activity Restless  Appearance/Hygiene Disheveled  Behavior Characteristics Cooperative  Mood Anxious  Thought Process  Coherency Circumstantial;Disorganized  Content Blaming others  Delusions Paranoid  Perception Hallucinations  Hallucination Auditory;Visual  Judgment Impaired  Confusion Mild  Danger to Self  Current suicidal ideation? Denies  Danger to Others  Danger to Others None reported or observed

## 2021-11-20 NOTE — Progress Notes (Signed)
Patient has been isolative to his room this shift. Patient stated to writer "I need mental help, but I am not crazy"  Patient presented as anxious and paranoid.  Patient has been compliant with medications and has had no incidents of behavorial dyscontrol.   Assess patient for safety, offer medications as prescribed, engage patient in 1:1 staff talks.   Continue to monitor as planned, Patient able to contract for safety.

## 2021-11-20 NOTE — Progress Notes (Signed)
Adult Psychoeducational Group Note  Date:  11/20/2021 Time:  8:33 PM  Group Topic/Focus:  Wrap-Up Group:   The focus of this group is to help patients review their daily goal of treatment and discuss progress on daily workbooks.  Participation Level:  Active  Participation Quality:  Appropriate  Affect:  Appropriate  Cognitive:  Appropriate  Insight: Appropriate  Engagement in Group:  Engaged  Modes of Intervention:  Discussion  Additional Comments:  Pt stated his goal for today was to focus on his treatment plan. Pt stated he accomplished his goal today. Pt stated he talked with his doctor and social worker about his care today. Pt rated his overall day a 10. Pt stated he was able to contact his mother today which improved his overall day. Pt stated he felt better about himself today. Pt stated he was able to attend all meals. Pt stated he took all medications provided today. Pt stated his appetite was pretty good today. Pt rated sleep last night was pretty good. Pt stated the goal tonight was to get some rest. Pt stated he had no physical pain tonight. Pt deny visual hallucinations and auditory issues tonight. Pt denies thoughts of harming himself or others. Pt stated he would alert staff if anything changed.  Jeffrey Holland 11/20/2021, 8:33 PM

## 2021-11-20 NOTE — [facility] Group Notes (Signed)
Pt didn't attend group. 

## 2021-11-20 NOTE — Group Note (Signed)
LCSW Group Therapy Notes ° °Type of Therapy and Topic: Group Therapy: Healthy Vs. Unhealthy Coping Strategies ° °Date and Time: 11/20/2021 at 1:00pm ° °Participation Level: [facility] PARTICIPATION LEVEL: Did Not Attend ° °Description of Group:  °In this group, patients will be encouraged to explore their healthy and unhealthy coping strategics. Coping strategies are actions that we take to deal with stress, problems, or uncomfortable emotions in our daily lives. Each patient will be challenged to read some scenarios and discuss the unhealthy and healthy coping strategies within those scenarios. Also, each patient will be challenged to describe current healthy and unhealthy strategies that they use in their own lives and discuss the outcomes and barriers to those strategies. This group will be process-oriented, with patients participating in exploration of their own experiences as well as giving and receiving support and challenge from other group members. ° °Therapeutic Goals: °Patient will identify personal healthy and unhealthy coping strategies. °Patient will identify healthy and unhealthy coping strategies, in others, through scenarios.  °Patient will identify expected outcomes of healthy and unhealthy coping strategies. °Patient will identify barriers to using healthy coping strategies.  ° °Summary of Patient Progress: Did not attend ° ° °Therapeutic Modalities: ° °Cognitive Behavioral Therapy °Solution Focused Therapy °Motivational Interviewing ° °Rosaline Ezekiel MSW, LCSW °Clincal Social Worker  °[facility]  °

## 2021-11-20 NOTE — Group Note (Signed)
Recreation Therapy Group Note   Group Topic:Healthy Decision Making  Group Date: 11/20/2021 Start Time: 1006 End Time: 1045 Facilitators: Caroll Rancher, LRT,CTRS Location: [address] Dayroom   Goal Area(s) Addresses:  Patient will effectively work with peer towards shared goal.  Patient will identify factors that guided their decision making.  Patient will pro-socially communicate ideas during group session.   Group Description: Patients were given a scenario that they were going to be stranded on a deserted [place] for several months before being rescued. Writer tasked them with making a list of 15 things they would choose to bring with them for "survival". The list of items was prioritized most important to least. Each patient would come up with their own list, then work together to create a new list of 15 items while in a group of 3-5 peers. LRT discussed each person's list and how it differed from others. The debrief included discussion of priorities, good decisions versus bad decisions, and how it is important to think before acting so we can make the best decision possible. LRT tied the concept of effective communication among group members to patient's support systems outside of the hospital and its benefit post discharge.   Affect/Mood: N/A   Participation Level: Did not attend    Clinical Observations/Individualized Feedback:     Plan: Continue to engage patient in RT group sessions 2-3x/week.   Caroll Rancher, Antonietta Jewel 11/20/2021 12:34 PM

## 2021-11-20 NOTE — Progress Notes (Signed)
Pt was encouraged but didn't attend orientation/goals group. ?

## 2021-11-20 NOTE — Progress Notes (Addendum)
[facility] MD Progress Note  11/20/2021 4:13 PM MUZAMIL HARKER  MRN:  [id number] Subjective:   Jeffrey Holland is a 36 y.o. male with no known past psychiatric history, who was initially admitted for inpatient psychiatric hospitalization on 11/15/2021 for management of hallucinations and delusions.    Case was discussed in the multidisciplinary team. MAR was reviewed and patient was compliant with medications.  He did require  PRN's for agitation yesterday around 1 pm, due to RN being concerned that patient was decompensating.      Psychiatric Team made the following recommendations yesterday:   - Continue Zyprexa 773m qam and 158mqhs  - Start Haldol 73m34mid for residual hallucinations -  Continue Cogentin 0.73mg62md PRN  - Agitation Protocol: Haldol 73mg 21mor po q8h PRN, Ativan 1mg p32mr 2mg IM87m8 hours PRN, Benadryl 50mg IM88mpo q8 hours PRN  Objectively patient appears paranoid of provider. Patient reports that he does not understand why he is hospitalized despite provider explaining to patient multiple times. Patient reports that he does not think he will ever be released from the hospital.  Patient is noted to say "this is hard" multiple times throughout assessment.  Patient initially denies that he has psychic powers but as he is seen holding his head saying "it is hard" he endorses that he does believe he has psychic powers and believes he is able to read my mind.  Patient reports that he also saw his brother this morning in the cafeteria but this VH is comforting.  Patient reports that his brother tells him "it would be okay."  Patient reports that he is otherwise sleeping well and eating well.  Patient reports that he feels that he is doing "so-so" and endorses that he will attempt to go to more groups today.  Patient denies SI and HI.  Patient denies that he is hearing multiple voices in his head when asked multiple times.He reports belief in thought insertion/withdrawal by unknown  persons.  Principal Problem: Schizophrenia spectrum disorder with psychotic disorder type not yet determined (HCC) [facility] Principal Problem:   Schizophrenia spectrum disorder with psychotic disorder type not yet determined (HCC) [facility] Problems:   Alcohol abuse   Cannabis abuse   Cocaine abuse, episodic use (HCC)   HKeystonetitis C  Total Time spent with patient: I personally spent 30 minutes on the unit in direct patient care. The direct patient care time included face-to-face time with the patient, reviewing the patient's chart, communicating with other professionals, and coordinating care. Greater than 50% of this time was spent in counseling or coordinating care with the patient regarding goals of hospitalization, psycho-education, and discharge planning needs.  Past Psychiatric History: See H&P  Past Medical History:  Past Medical History:  Diagnosis Date   Anxiety    Asthma    Panic     Family History: see H&P  Family Psychiatric  History: See H&P  Social History:  Social History   Substance and Sexual Activity  Alcohol Use Yes     Social History   Substance and Sexual Activity  Drug Use Yes   Types: Marijuana, Methamphetamines, Cocaine    Social History   Socioeconomic History   Marital status: Single    Spouse name: Not on file   Number of children: Not on file   Years of education: Not on file   Highest education level: Not on file  Occupational History   Not on file  Tobacco Use  Smoking status: Every Day    Packs/day: 1.00    Types: Cigarettes   Smokeless tobacco: Not on file  Vaping Use   Vaping Use: Every day  Substance and Sexual Activity   Alcohol use: Yes   Drug use: Yes    Types: Marijuana, Methamphetamines, Cocaine   Sexual activity: Yes    Birth control/protection: None  Other Topics Concern   Not on file  Social History Narrative   ** Merged History Encounter **       Social Determinants of Health   Financial Resource Strain:  Not on file  Food Insecurity: Not on file  Transportation Needs: Not on file  Physical Activity: Not on file  Stress: Not on file  Social Connections: Not on file   Sleep: Fair  Appetite:  Good  Current Medications: Current Facility-Administered Medications  Medication Dose Route Frequency Provider Last Rate Last Admin   acetaminophen (TYLENOL) tablet 650 mg  650 mg Oral Q6H PRN Lovena Le, [name], PA-C       albuterol (VENTOLIN HFA) 108 (90 Base) MCG/ACT inhaler 2 puff  2 puff Inhalation Q6H PRN Harlow Asa, MD       alum & mag hydroxide-simeth (MAALOX/MYLANTA) 200-200-20 MG/5ML suspension 30 mL  30 mL Oral Q4H PRN Lovena Le, [name], PA-C       benztropine (COGENTIN) tablet 0.5 mg  0.5 mg Oral BID PRN Nelda Marseille, Coco Sharpnack E, MD       diphenhydrAMINE (BENADRYL) capsule 50 mg  50 mg Oral Q8H PRN Margorie John W, PA-C   50 mg at 11/18/21 2311   Or   diphenhydrAMINE (BENADRYL) injection 50 mg  50 mg Intramuscular Q8H PRN Margorie John W, PA-C   50 mg at 11/19/21 1255   famotidine (PEPCID) tablet 20 mg  20 mg Oral BID Damita Dunnings B, MD   20 mg at 11/20/21 0820   haloperidol (HALDOL) tablet 10 mg  10 mg Oral Q2000 Damita Dunnings B, MD       haloperidol (HALDOL) tablet 5 mg  5 mg Oral Q8H PRN Margorie John W, PA-C   5 mg at 11/18/21 2311   Or   haloperidol lactate (HALDOL) injection 5 mg  5 mg Intramuscular Q8H PRN Margorie John W, PA-C   5 mg at 11/19/21 1255   [START ON 11/21/2021] haloperidol (HALDOL) tablet 5 mg  5 mg Oral Q breakfast Damita Dunnings B, MD       hydrOXYzine (ATARAX) tablet 25 mg  25 mg Oral Q6H PRN Nelda Marseille, Adriene Knipfer E, MD       loperamide (IMODIUM) capsule 2-4 mg  2-4 mg Oral PRN Harlow Asa, MD       LORazepam (ATIVAN) tablet 1 mg  1 mg Oral Q8H PRN Nelda Marseille, Daviel Allegretto E, MD   1 mg at 11/19/21 0002   Or   LORazepam (ATIVAN) injection 2 mg  2 mg Intramuscular Q8H PRN Viann Fish E, MD   2 mg at 11/19/21 1255   LORazepam (ATIVAN) tablet 1 mg  1 mg Oral Q6H PRN Harlow Asa, MD        magnesium hydroxide (MILK OF MAGNESIA) suspension 30 mL  30 mL Oral Daily PRN Margorie John W, PA-C       multivitamin with minerals tablet 1 tablet  1 tablet Oral Daily Harlow Asa, MD   1 tablet at 11/20/21 5638   nicotine polacrilex (NICORETTE) gum 2 mg  2 mg Oral PRN Harlow Asa, MD  2 mg at 11/19/21 1110   OLANZapine (ZYPREXA) tablet 10 mg  10 mg Oral Daily Damita Dunnings B, MD   10 mg at 11/20/21 0820   OLANZapine (ZYPREXA) tablet 15 mg  15 mg Oral QHS Damita Dunnings B, MD       ondansetron (ZOFRAN-ODT) disintegrating tablet 4 mg  4 mg Oral Q6H PRN Harlow Asa, MD       thiamine tablet 100 mg  100 mg Oral Daily Nelda Marseille, Shermika Balthaser E, MD   100 mg at 11/20/21 4034   traZODone (DESYREL) tablet 100 mg  100 mg Oral QHS PRN Harlow Asa, MD   100 mg at 11/19/21 2129    Lab Results:  Results for orders placed or performed during the hospital encounter of 11/15/21 (from the past 48 hour(s))  Hepatic function panel     Status: Abnormal   Collection Time: 11/20/21  6:30 AM  Result Value Ref Range   Total Protein 7.9 6.5 - 8.1 g/dL   Albumin 4.4 3.5 - 5.0 g/dL   AST 94 (H) 15 - 41 U/L   ALT 109 (H) 0 - 44 U/L   Alkaline Phosphatase 52 38 - 126 U/L   Total Bilirubin 0.9 0.3 - 1.2 mg/dL   Bilirubin, Direct 0.1 0.0 - 0.2 mg/dL   Indirect Bilirubin 0.8 0.3 - 0.9 mg/dL    Comment: Performed at [facility], [place] [address], [place], [place] [address]    Blood Alcohol level:  Lab Results  Component Value Date   ETH <10 11/14/2021   ETH <10 56/38/7564    Metabolic Disorder Labs: Lab Results  Component Value Date   HGBA1C 5.5 11/15/2021   MPG 111.15 11/15/2021   No results found for: PROLACTIN Lab Results  Component Value Date   CHOL 151 11/15/2021   TRIG 98 11/15/2021   HDL 42 11/15/2021   CHOLHDL 3.6 11/15/2021   VLDL 20 11/15/2021   LDLCALC 89 11/15/2021    Physical Findings: AIMS: Facial and Oral Movements Muscles of Facial Expression: None,  normal Lips and Perioral Area: None, normal Jaw: None, normal Tongue: None, normal,Extremity Movements Upper (arms, wrists, hands, fingers): None, normal Lower (legs, knees, ankles, toes): None, normal, Trunk Movements Neck, shoulders, hips: None, normal, Overall Severity Severity of abnormal movements (highest score from questions above): None, normal Incapacitation due to abnormal movements: None, normal Patient's awareness of abnormal movements (rate only patient's report): No Awareness, Dental Status Current problems with teeth and/or dentures?: No Does patient usually wear dentures?: No  CIWA:  CIWA-Ar Total: 1  Musculoskeletal: Strength & Muscle Tone: within normal limits Gait & Station: normal Patient leans: N/A  Psychiatric Specialty Exam:  Presentation  General Appearance: fair hygiene, appears older than stated age   Eye Contact: Fleeting   Speech:Clear and Coherent , occasional pauses concerning for thought blocking   Speech Volume:Normal   Handedness:Right     Mood and Affect  Mood:Aloof   Affect: Less sedated  appearing, more paranoid, no longer labile     Thought Process  Thought Processes:concrete, linear for most of interview with some tangential thinking, more preservative on discharge   Duration of Psychotic Symptoms: Less than six months   Past Diagnosis of Schizophrenia or Psychoactive disorder: No   Orientation:Oriented to self, month and county but not situation or year   Thought Content: He reports some residual auditory/visual hallucinations and delusional beliefs that he is able to have psychic powers and belief he can read minds;  he believes in thought insertion/withdrawal;he denies ideas of reference, SI or HI.  He has concerns for some residual thought blocking and internal preoccupation on exam   Hallucinations: AH of brother saying "its going to be okay" and VH of brother   Ideas of Reference:Denied   Suicidal Thoughts:Suicidal  Thoughts: No   Homicidal Thoughts:Homicidal Thoughts: No     Sensorium  Memory:Limited secondary to psychosis   Judgment:Impaired   Insight: Poor     Executive Functions  Concentration: Poor   Attention Span:Poor   [place] secondary to psychosis   Language:Fair     Psychomotor Activity  Psychomotor Activity: Less sedated appearing today-moving around the hall; AIMS 0, no cogwheeling, no stiffness, no tremor on exam today     Assets  Assets:Resilience  Sleep - No data recorded  Physical Exam Constitutional:      Appearance: Normal appearance.  HENT:     Head: Normocephalic and atraumatic.  Pulmonary:     Effort: Pulmonary effort is normal.  Neurological:     Mental Status: He is alert.   Review of Systems  Respiratory:  Negative for shortness of breath.   Cardiovascular:  Negative for chest pain.  Gastrointestinal:  Negative for diarrhea, nausea and vomiting.  Psychiatric/Behavioral:  Positive for hallucinations. Negative for suicidal ideas. The patient is nervous/anxious.   Blood pressure (!) 123/94, pulse (!) 112, temperature (!) 97.5 F (36.4 C), temperature source Oral, resp. rate 16, height 5' 6"  (1.676 m), weight 62.6 kg, SpO2 100 %. Body mass index is 22.27 kg/m.   Treatment Plan Summary: Daily contact with patient to assess and evaluate symptoms and progress in treatment and Medication management Jeffrey Holland is a 36 yo patient w/ no known PPH who presented endorsing AVH, delusions, and depressed mood.  Staff have noted that patient continues to have labile mood suddenly appearing as if he will cry in the middle of conversation, but no tears are coming out as were seen on initial assessment.  Per nursing staff patient's psychotic behavior vacillates throughout the day and at times patient is able to hold some resemblance of a conversation another times he appears to be preservative on certain topics.  On assessment  today patient attempted to deny psychotic symptoms but had significant difficulty and eventually endorsed that he was still delusional and having hallucinations.  Patient's insight is very poor as he has no understanding of his hospitalization or what is going on despite multiple attempts to clarify with patient.  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 Psychiatric Diagnoses and Treatment:              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 (r/o Substance induced psychosis vs MDD, recurrent, severe w/ psychotic features vs Schizophreniform disorder vs Bipolar disorder, current episode depressed w/ psychotic features vs psychosis secondary to general medical condition) - Continue Zyprexa 339m qam and 178mqhs, intend to start titrating down while going up on Haldol- we will continue at current dose today -Increase Haldol to 39m110maily and 10 mg nightly for residual hallucinations -  Continue Cogentin 0.39mg26md PRN  - Agitation Protocol: Haldol 39mg 539mor po q8h PRN, Ativan 1mg p57mr 2mg IM38m8 hours PRN, Benadryl 50mg IM39mpo q8 hours PRN -- New onset psychosis w/u: Negative noncontrast head CT on 11/13/2021, Hep C Ab positive; ESR 2, ceruloplasmin 20.5, HIV nonreactive, B12 348, PR nonreactive, ANA negative, TSH 0.667, UDS  positive for THC, alcohol less than 10, BMP within normal limits other than a glucose of 123, white count 13.1 trending down to 10.2, AST elevated at 62 and ALT elevated at 103, B1 125.3- Labs Pending:  Heavy metals --Antipsychotic lab monitoring: EKG: QTC- 432 and on repeat QTC 403ms, A17m5, Lipids WNL             -- Continue Trazodone 100mg qhs 28minsomnia and improved daytime sleep hygiene encouraged             -- Encouraged patient to participate in unit milieu and in scheduled group therapies                EtoH use (r/o alcohol use d/o) Cocaine use - episodic (r/o stimulant use d/o) 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 Medical Issues Being Addressed:              Tobacco Use Disorder             -- Nicotine patch 21mg/24 ho52mordered             -- Smoking cessation encouraged               Hx of Gastric Ulcers -- Continue home Pepcid 20mg BID   52mC ab positive/Transaminitis -- Hep C RNA Quant lab-353,000 - Will need f/u OP -- AST trending up to 62> 94 and ALT up to 103> 109 (has been at AST 155 and ALT 228 2 years ago) with T bili 1.4 > 0.9 and indirect bili 1.3 > 0.8 - Consulted GI, Dr. 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   Leukocytosis - resolved -- Repeat CBC shows WBC 10.2 on 11/17/21   4. Discharge Planning:              -- Social work and case management to assist with discharge planning and identification of hospital follow-up needs prior to discharge             -- Discharge Concerns: Need to establish a safety plan; Medication compliance and effectiveness             -- Discharge Goals: Return home with outpatient referrals for mental health follow-up including  medication  management/psychotherapy     PGY-2 Freida Busman, MD 11/20/2021, 4:13 PM

## 2021-11-21 ENCOUNTER — Inpatient Hospital Stay (HOSPITAL_COMMUNITY): Payer: Federal, State, Local not specified - Other

## 2021-11-21 DIAGNOSIS — F29 Unspecified psychosis not due to a substance or known physiological condition: Secondary | ICD-10-CM | POA: Diagnosis not present

## 2021-11-21 LAB — HEAVY METALS, BLOOD
Arsenic: 2 ug/L (ref 0–9)
Lead: 2.7 ug/dL (ref 0.0–3.4)
Mercury: <1 ug/L (ref 0.0–14.9)

## 2021-11-21 LAB — AMMONIA: Ammonia: 14 umol/L (ref 9–35)

## 2021-11-21 MED ORDER — LORAZEPAM 1 MG PO TABS
1.0000 mg | ORAL_TABLET | Freq: Three times a day (TID) | ORAL | Status: DC | PRN
  Administered 2021-12-03: 1 mg via ORAL
  Filled 2021-11-21: qty 1

## 2021-11-21 MED ORDER — ENSURE ENLIVE PO LIQD
237.0000 mL | Freq: Two times a day (BID) | ORAL | Status: DC
  Administered 2021-11-23 – 2021-12-01 (×17): 237 mL via ORAL
  Filled 2021-11-21 (×25): qty 237

## 2021-11-21 MED ORDER — MIRTAZAPINE 7.5 MG PO TABS
7.5000 mg | ORAL_TABLET | Freq: Every day | ORAL | Status: DC
  Administered 2021-11-21 – 2021-11-28 (×8): 7.5 mg via ORAL
  Filled 2021-11-21 (×9): qty 1

## 2021-11-21 MED ORDER — LORAZEPAM 2 MG/ML IJ SOLN: 2.0000 mg | Freq: Three times a day (TID) | INTRAMUSCULAR | Status: DC | PRN

## 2021-11-21 MED ORDER — OLANZAPINE 10 MG PO TABS
10.0000 mg | ORAL_TABLET | Freq: Two times a day (BID) | ORAL | Status: DC
  Administered 2021-11-21 – 2021-11-22 (×2): 10 mg via ORAL
  Filled 2021-11-21 (×4): qty 1

## 2021-11-21 NOTE — Progress Notes (Signed)
°   11/21/21 0500  Sleep  Number of Hours 5

## 2021-11-21 NOTE — Progress Notes (Signed)
[facility] MD Progress Note  11/21/2021 12:50 PM Jeffrey Holland  MRN:  [id number] Subjective:  Jeffrey Holland is a 36 y.o. male with no known past psychiatric history, who was initially admitted for inpatient psychiatric hospitalization on 11/15/2021 for management of hallucinations and delusions.     Case was discussed in the multidisciplinary team. MAR was reviewed and patient was compliant with medications.  He did require  PRN's for agitation yesterday around 1 pm, due to RN being concerned that patient was decompensating.      Psychiatric Team made the following recommendations yesterday:  - Continue Zyprexa 49m qam and 161mqhs, intend to start titrating down while going up on Haldol- we will continue at current dose today -Increase Haldol to 75m675maily and 10 mg nightly for residual hallucinations -  Continue Cogentin 0.75mg32md PRN  - Agitation Protocol: Haldol 75mg 43mor po q8h PRN, Ativan 1mg p60mr 2mg IM40m8 hours PRN, Benadryl 50mg IM17mpo q8 hours PRN -- New onset psychosis w/u: Negative noncontrast head CT on 11/13/2021, Hep C Ab positive; ESR 2, ceruloplasmin 20.5, HIV nonreactive, B12 348, PR nonreactive, ANA negative, TSH 0.667, UDS positive for THC, alcohol less than 10, BMP within normal limits other than a glucose of 123, white count 13.1 trending down to 10.2, AST elevated at 62 and ALT elevated at 103, B1 125.3- Labs Pending:  Heavy metals --Antipsychotic lab monitoring: EKG: QTC- 432 and on repeat QTC 403ms, A1275m5, Lipids WNL             -- Continue Trazodone 100mg qhs 93minsomnia and improved daytime sleep hygiene encouraged             -- Encouraged patient to participate in unit milieu and in scheduled group therapies   Objectively patient has been noted by staff to be labile in his emotions. Patient can be seen suddenly appearing as if he wants to cry in the middle of talking. Patient has also been seen making psychotic statements over the phone to his parents and just  overall bizarre behavior and appearing on the unit. RN notes that patient was responding to internal stimuli at one point during the past 24 hrs.  Subjectively patient reports that he has been trying to read, watch TV, and do a puzzle all at the same time in the dayroom. Patient reports that he saw and heard from his brother this AM during breakfast. Patient reports that his brother only says, "it's going to be alright little brother." Patient reports that he is not hearing 3-4 voices in his head, but continues to hear one that says the same thing as his brother, but the voice does not sound like his brother. Patient reports that he still feels that he has psychic powers and is worried about his ability to keep his thoughts to himself. Patient reports that his thoughts are loud and racing. Patient endorses that the thought racing is somewhat better than when he presented. Patient denies SI and HI but reports that his mood is "sad."  Principal Problem: Schizophrenia spectrum disorder with psychotic disorder type not yet determined (HCC) DiagnLafourche Crossings: Principal Problem:   Schizophrenia spectrum disorder with psychotic disorder type not yet determined (HCC) ActivNelsonroblems:   Alcohol abuse   Cannabis abuse   Cocaine abuse, episodic use (HCC)   HepMorgan Hilltis C  Total Time spent with patient: 20 minutes  Past Psychiatric History: See H&P  Past Medical History:  Past Medical History:  Diagnosis Date   Anxiety    Asthma    Panic    History reviewed. No pertinent surgical history. Family History: History reviewed. No pertinent family history. Family Psychiatric  History: See H&P Social History:  Social History   Substance and Sexual Activity  Alcohol Use Yes     Social History   Substance and Sexual Activity  Drug Use Yes   Types: Marijuana, Methamphetamines, Cocaine    Social History   Socioeconomic History   Marital status: Single    Spouse name: Not on file   Number of children: Not on  file   Years of education: Not on file   Highest education level: Not on file  Occupational History   Not on file  Tobacco Use   Smoking status: Every Day    Packs/day: 1.00    Types: Cigarettes   Smokeless tobacco: Not on file  Vaping Use   Vaping Use: Every day  Substance and Sexual Activity   Alcohol use: Yes   Drug use: Yes    Types: Marijuana, Methamphetamines, Cocaine   Sexual activity: Yes    Birth control/protection: None  Other Topics Concern   Not on file  Social History Narrative   ** Merged History Encounter **       Social Determinants of Health   Financial Resource Strain: Not on file  Food Insecurity: Not on file  Transportation Needs: Not on file  Physical Activity: Not on file  Stress: Not on file  Social Connections: Not on file   Additional Social History:                         Sleep: Fair   Appetite:  Fair  Current Medications: Current Facility-Administered Medications  Medication Dose Route Frequency Provider Last Rate Last Admin   acetaminophen (TYLENOL) tablet 650 mg  650 mg Oral Q6H PRN Lovena Le, [name], PA-C       albuterol (VENTOLIN HFA) 108 (90 Base) MCG/ACT inhaler 2 puff  2 puff Inhalation Q6H PRN Harlow Asa, MD       alum & mag hydroxide-simeth (MAALOX/MYLANTA) 200-200-20 MG/5ML suspension 30 mL  30 mL Oral Q4H PRN Lovena Le, [name], PA-C       benztropine (COGENTIN) tablet 0.5 mg  0.5 mg Oral BID PRN Nelda Marseille, Amy E, MD       diphenhydrAMINE (BENADRYL) capsule 50 mg  50 mg Oral Q8H PRN Margorie John W, PA-C   50 mg at 11/20/21 2223   Or   diphenhydrAMINE (BENADRYL) injection 50 mg  50 mg Intramuscular Q8H PRN Margorie John W, PA-C   50 mg at 11/19/21 1255   famotidine (PEPCID) tablet 20 mg  20 mg Oral BID Damita Dunnings B, MD   20 mg at 11/21/21 0748   feeding supplement (ENSURE ENLIVE / ENSURE PLUS) liquid 237 mL  237 mL Oral BID BM Mallissa Lorenzen B, MD       haloperidol (HALDOL) tablet 10 mg  10 mg Oral Q2000 Caydance Kuehnle  B, MD   10 mg at 11/20/21 2046   haloperidol (HALDOL) tablet 5 mg  5 mg Oral Q8H PRN Margorie John W, PA-C   5 mg at 11/18/21 2311   Or   haloperidol lactate (HALDOL) injection 5 mg  5 mg Intramuscular Q8H PRN Margorie John W, PA-C   5 mg at 11/19/21 1255   haloperidol (HALDOL) tablet 5 mg  5 mg Oral Q breakfast Damita Dunnings  B, MD   5 mg at 11/21/21 0748   hydrOXYzine (ATARAX) tablet 25 mg  25 mg Oral Q6H PRN Harlow Asa, MD       loperamide (IMODIUM) capsule 2-4 mg  2-4 mg Oral PRN Harlow Asa, MD       LORazepam (ATIVAN) tablet 1 mg  1 mg Oral Q8H PRN Freida Busman, MD       Or   LORazepam (ATIVAN) injection 2 mg  2 mg Intramuscular Q8H PRN Freida Busman, MD       LORazepam (ATIVAN) tablet 1 mg  1 mg Oral Q6H PRN Nelda Marseille, Amy E, MD       magnesium hydroxide (MILK OF MAGNESIA) suspension 30 mL  30 mL Oral Daily PRN Margorie John W, PA-C       multivitamin with minerals tablet 1 tablet  1 tablet Oral Daily Harlow Asa, MD   1 tablet at 11/21/21 9774   nicotine polacrilex (NICORETTE) gum 2 mg  2 mg Oral PRN Harlow Asa, MD   2 mg at 11/19/21 1110   OLANZapine (ZYPREXA) tablet 10 mg  10 mg Oral BID Damita Dunnings B, MD       ondansetron (ZOFRAN-ODT) disintegrating tablet 4 mg  4 mg Oral Q6H PRN Harlow Asa, MD       thiamine tablet 100 mg  100 mg Oral Daily Nelda Marseille, Amy E, MD   100 mg at 11/21/21 0748   traZODone (DESYREL) tablet 100 mg  100 mg Oral QHS PRN Harlow Asa, MD   100 mg at 11/20/21 2046    Lab Results:  Results for orders placed or performed during the hospital encounter of 11/15/21 (from the past 48 hour(s))  Hepatic function panel     Status: Abnormal   Collection Time: 11/20/21  6:30 AM  Result Value Ref Range   Total Protein 7.9 6.5 - 8.1 g/dL   Albumin 4.4 3.5 - 5.0 g/dL   AST 94 (H) 15 - 41 U/L   ALT 109 (H) 0 - 44 U/L   Alkaline Phosphatase 52 38 - 126 U/L   Total Bilirubin 0.9 0.3 - 1.2 mg/dL   Bilirubin, Direct 0.1 0.0 - 0.2 mg/dL    Indirect Bilirubin 0.8 0.3 - 0.9 mg/dL    Comment: Performed at [facility], [place] [address], [place], [place] [address]    Blood Alcohol level:  Lab Results  Component Value Date   ETH <10 11/14/2021   ETH <10 53/20/2334    Metabolic Disorder Labs: Lab Results  Component Value Date   HGBA1C 5.5 11/15/2021   MPG 111.15 11/15/2021   No results found for: PROLACTIN Lab Results  Component Value Date   CHOL 151 11/15/2021   TRIG 98 11/15/2021   HDL 42 11/15/2021   CHOLHDL 3.6 11/15/2021   VLDL 20 11/15/2021   LDLCALC 89 11/15/2021    Physical Findings: AIMS: Facial and Oral Movements Muscles of Facial Expression: None, normal Lips and Perioral Area: None, normal Jaw: None, normal Tongue: None, normal,Extremity Movements Upper (arms, wrists, hands, fingers): None, normal Lower (legs, knees, ankles, toes): None, normal, Trunk Movements Neck, shoulders, hips: None, normal, Overall Severity Severity of abnormal movements (highest score from questions above): None, normal Incapacitation due to abnormal movements: None, normal Patient's awareness of abnormal movements (rate only patient's report): No Awareness, Dental Status Current problems with teeth and/or dentures?: No Does patient usually wear dentures?: No  CIWA:  CIWA-Ar Total:  0 COWS:     Musculoskeletal: Strength & Muscle Tone: within normal limits Gait & Station: normal Patient leans: N/A  Psychiatric Specialty Exam:  Presentation  General Appearance: -- (appears older than stated age, but decently groomed)  Eye Contact:Fleeting  Speech:Clear and Coherent  Speech Volume:Decreased  Handedness:Right   Mood and Affect  Mood:Labile; Depressed  Affect:Congruent   Thought Process  Thought Processes:Goal Directed  Descriptions of Associations:Circumstantial  Orientation:-- (oriented to person, place, and month says its 2022 but not suprised when corrected)  Thought  Content:Delusions  History of Schizophrenia/Schizoaffective disorder:No  Duration of Psychotic Symptoms:Less than six months  Hallucinations:Hallucinations: Auditory; Visual  Ideas of Reference:Delusions  Suicidal Thoughts:Suicidal Thoughts: No  Homicidal Thoughts:Homicidal Thoughts: No   Sensorium  Memory:Immediate Poor; Recent Poor; Remote Fair  Judgment:Impaired  Insight:None   Executive Functions  Concentration:Poor  Attention Span:Fair  Recall:Good  Fund of Knowledge:Poor  Language:Fair   Psychomotor Activity  Psychomotor Activity:Psychomotor Activity: Normal   Assets  Assets:Resilience; Housing   Sleep  Sleep:Sleep: Fair    Physical Exam: Physical Exam HENT:     Head: Normocephalic and atraumatic.  Pulmonary:     Effort: Pulmonary effort is normal.  Skin:    Comments: 2 tattoos on the back of his R and L hand (1 each). Patient reports that they were made at home via a machine a "friend made" approx 10 years ago. Patient reports he did the touch ups himself,  with a machine he made.   Neurological:     Mental Status: He is alert and oriented to person, place, and time.   Review of Systems  Gastrointestinal:  Negative for abdominal pain.  Psychiatric/Behavioral:  Positive for depression and hallucinations. Negative for suicidal ideas.   Blood pressure 112/83, pulse 93, temperature 97.7 F (36.5 C), temperature source Oral, resp. rate 18, height 5' 6"  (1.676 m), weight 62.6 kg, SpO2 98 %. Body mass index is 22.27 kg/m.   Treatment Plan Summary: Daily contact with patient to assess and evaluate symptoms and progress in treatment and Medication management Jeffrey Holland is a 36 yo patient w/ no known PPH who presented endorsing AVH, delusions, and depressed mood. Jeffrey Holland continues to appear bizarre, and at times paranoid. Patient was a bit more forthcoming about his AVH today, but that too continues. Will continue to decrease Zyprexa. Will hold  Haldol at current dose today due to decreased PO intake 2/2 pending RUQ [place].Marland Kitchen  Patient appears to have some mood instability as well and has had a noticeably flat affect and would benefit from mood medication. Patient has some transamnitis with reserved Plt function but will try non SSRI to minimize thrombocytopenia.  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 Psychiatric Diagnoses and Treatment:              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 (r/o Substance induced psychosis vs MDD, recurrent, severe w/ psychotic features vs Schizophreniform disorder vs Bipolar disorder, current episode depressed w/ psychotic features vs psychosis secondary to general medical condition) - Decrease Zyprexa to 30m BID , intend to continue titrating down while going up on Haldol- we will continue at current dose today -Increase Haldol to 593mdaily and 10 mg nightly for residual hallucinations - Start Remeron 7.19m32mor depressed mood -  Continue Cogentin 0.19mg16md PRN  - Agitation Protocol: Haldol 19mg 89mor po q8h PRN, Ativan 1mg p59mr 2mg IM33m8 hours PRN, Benadryl 50mg IM3mpo q8 hours PRN -- New onset psychosis w/u: Negative noncontrast head CT on 11/13/2021, Hep C Ab positive; ESR 2, ceruloplasmin 20.5, HIV nonreactive, B12 348, PR nonreactive, ANA negative, TSH  0.667, UDS positive for THC, alcohol less than 10, BMP within normal limits other than a glucose of 123, white count 13.1 trending down to 10.2, AST elevated at 62 and ALT elevated at 103, B1 125.3- Labs Pending:  Heavy metals --Antipsychotic lab monitoring: EKG: QTC- 432 and on repeat QTC 403ms, A153m5, Lipids WNL             -- Continue Trazodone 100mg qhs 24minsomnia and improved daytime sleep hygiene encouraged             -- Encouraged patient to participate in unit milieu and in scheduled group therapies                EtoH use (r/o alcohol use d/o) Cocaine use - episodic (r/o stimulant use d/o) 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 Medical Issues Being Addressed:              Tobacco Use Disorder             -- Nicotine patch 21mg/24 ho74mordered             -- Smoking cessation encouraged               Hx of Gastric Ulcers -- Continue home Pepcid 20mg BID   69mC ab positive/Transaminitis -- Hep C RNA Quant lab-353,000 - Will need f/u OP -- AST trending up to 62> 94 and ALT up to 103> 109 (has been at AST 155 and ALT 228 2 years ago) with T bili 1.4 > 0.9 and indirect bili 1.3 > 0.8 - Consulted GI, Dr. 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  - RUQ US today   LKoreakocytosis - resolved -- Repeat CBC shows WBC 10.2 on 11/17/21   4. Discharge Planning:              -- Social work and case management to assist with discharge planning and identification of hospital follow-up needs prior to discharge             -- Discharge Concerns: Need to  establish a safety plan; Medication compliance and effectiveness             -- Discharge Goals: Return home with outpatient referrals for mental health follow-up including medication  management/psychotherapy   PGY-2 Freida Busman, MD 11/21/2021, 12:50 PM

## 2021-11-21 NOTE — Progress Notes (Signed)
Adult Psychoeducational Group Note  Date:  11/21/2021 Time:  8:58 PM  Group Topic/Focus:  Wrap-Up Group:   The focus of this group is to help patients review their daily goal of treatment and discuss progress on daily workbooks.  Participation Level:  Active  Participation Quality:  Appropriate  Affect:  Appropriate  Cognitive:  Appropriate  Insight: Appropriate  Engagement in Group:  Engaged  Modes of Intervention:  Discussion  Additional Comments:  Pt stated his goal for today was to focus on his treatment plan. Pt stated he accomplished his goal today. Pt stated he talked with his doctor and social worker about his care today. Pt rated his overall day a 10. Pt stated he made no calls today. Pt stated he felt better about himself today. Pt stated he was able to attend all meals. Pt stated he took all medications provided today. Pt stated his appetite was pretty good today. Pt rated sleep last night was pretty good. Pt stated the goal tonight was to get some rest. Pt stated he had some physical pain tonight. Pt stated he had some mild pain in his stomach tonight. Pt rated the mild pain in his stomach a 3 on the pain level scale. Pt nurse was updated on the situation. Pt deny visual hallucinations tonight. Pt admitted to dealing with auditory issues tonight.Pt nurse was updated on the situation. Pt denies thoughts of harming himself or others. Pt stated he would alert staff if anything changed.  Jeffrey Holland 11/21/2021, 8:58 PM

## 2021-11-21 NOTE — Group Note (Signed)
Recreation Therapy Group Note   Group Topic:Self-Esteem  Group Date: 11/21/2021 Start Time: 1000 End Time: 1055 Facilitators: Caroll Rancher, LRT,CTRS Location: [address] Dayroom   Goal Area(s) Addresses:  Patient will be able to identify benefits of positive self esteem.  Patient will successfully share why positive self esteem is important. Patient will be able to express how positive self esteem will benefit them post d/c.  Group Description:  How I See Me.  Patients and LRT discussed the importance of how they see themselves and their positive qualities. Patients were then given a picture of a blank face, and told to illustrate and describe how they see themselves and their positive traits.  Patients were given markers to complete the assignment. LRT played music for the group as they completed their assignment. Patients shared their completed assignment with each other.    Affect/Mood: Anxious, Depressed, and Tearful   Participation Level: None   Participation Quality: None   Behavior: Disinterested   Speech/Thought Process: Paranoid   Insight: Impaired   Judgement: Impaired   Modes of Intervention: Art and Music   Patient Response to Interventions:  Avoidant   Education Outcome:  Acknowledges education and In group clarification offered    Clinical Observations/Individualized Feedback: Pt was tearful and anxious during group.  Pt left group but came back twice for a few seconds before leaving and not returning for good.    Plan: Continue to engage patient in RT group sessions 2-3x/week.   Caroll Rancher, LRT,CTRS 11/21/2021 12:02 PM

## 2021-11-21 NOTE — Progress Notes (Signed)
Pt visible on the unit some, pt continues to be paranoid and disorganized at times    11/21/21 2100  Psych Admission Type (Psych Patients Only)  Admission Status Involuntary  Psychosocial Assessment  Patient Complaints Anxiety  Eye Contact Brief  Facial Expression Sad  Affect Anxious  Speech Tangential  Interaction Assertive  Motor Activity Restless  Appearance/Hygiene Disheveled  Behavior Characteristics Cooperative;Restless  Mood Anxious;Suspicious;Preoccupied  Thought Process  Coherency Circumstantial;Disorganized  Content Blaming self  Delusions Paranoid  Perception Hallucinations  Hallucination Auditory;Visual  Judgment Impaired  Confusion Mild  Danger to Self  Current suicidal ideation? Denies  Danger to Others  Danger to Others None reported or observed

## 2021-11-21 NOTE — Progress Notes (Signed)
Pt was encouraged but didn't attend orientation/goals group. ?

## 2021-11-22 DIAGNOSIS — F29 Unspecified psychosis not due to a substance or known physiological condition: Secondary | ICD-10-CM | POA: Diagnosis not present

## 2021-11-22 MED ORDER — LITHIUM CARBONATE 300 MG PO CAPS
300.0000 mg | ORAL_CAPSULE | Freq: Every day | ORAL | Status: DC
  Administered 2021-11-22: 300 mg via ORAL
  Filled 2021-11-22 (×3): qty 1

## 2021-11-22 MED ORDER — HALOPERIDOL 5 MG PO TABS
15.0000 mg | ORAL_TABLET | Freq: Every day | ORAL | Status: DC
  Administered 2021-11-22 – 2021-11-28 (×7): 15 mg via ORAL
  Filled 2021-11-22 (×8): qty 3

## 2021-11-22 MED ORDER — OLANZAPINE 5 MG PO TABS
5.0000 mg | ORAL_TABLET | Freq: Two times a day (BID) | ORAL | Status: DC
  Administered 2021-11-22 – 2021-11-23 (×2): 5 mg via ORAL
  Filled 2021-11-22 (×6): qty 1

## 2021-11-22 NOTE — Progress Notes (Signed)
°   11/22/21 1500  Psych Admission Type (Psych Patients Only)  Admission Status Involuntary  Psychosocial Assessment  Patient Complaints None  Eye Contact Brief  Facial Expression Sad  Affect Anxious  Speech Tangential  Interaction Assertive  Motor Activity Restless  Appearance/Hygiene Disheveled  Behavior Characteristics Cooperative  Mood Preoccupied  Thought Process  Coherency Circumstantial;Disorganized  Content Blaming self  Delusions Paranoid  Perception Hallucinations  Hallucination Auditory;Visual  Judgment Impaired  Confusion Mild  Danger to Self  Current suicidal ideation? Denies  Danger to Others  Danger to Others None reported or observed

## 2021-11-22 NOTE — Group Note (Signed)
Type of Therapy/Topic: Identifying Irrational Beliefs/Thoughts ° °Participation Level: Did Not Attend ° °Description of Group: °The purpose of this group is to assist patients in learning to identify irrational beliefs and thoughts that contribute to their negative emotions and experience positive emotions. Patients will be guided to discuss ways in which they have been effected by irrational thoughts and beliefs and how to transform those irrational beliefs into rational ones. Newly identified rational beliefs will be juxtaposed with experiences of positive emotions or situations, and patients will be challenged to use rational beliefs or thoughts to combat negative ones. Special emphasis will be placed on coping with irrational beliefs in conflict situations, and patients will process healthy conflict resolution skills. ° °Therapeutic Goals: °1. Patient will identify two irrational thoughts or beliefs  to reflect on in order to balance out those thoughts °2. Patient will label two or more irrational thoughts/beliefs that they find the most difficult to cope with °3. Patient will demonstrate positive conflict resolution skills through discussion and/or role plays that will assist in transforming irrational thoughts or beliefs into positive ones. ° °Summary of Patient Progress: Did not attend ° ° °Therapeutic Modalities: °Cognitive Behavioral Therapy °Feelings Identification °Dialectical Behavioral Therapy °

## 2021-11-22 NOTE — [facility] Group Notes (Signed)
Adult Psychoeducational Group Note  Date:  11/22/2021 Time:  9:06 AM  Group Topic/Focus:  Goals Group:   The focus of this group is to help patients establish daily goals to achieve during treatment and discuss how the patient can incorporate goal setting into their daily lives to aide in recovery.  Participation Level:  Did Not Attend   Dub Mikes 11/22/2021, 9:06 AM

## 2021-11-22 NOTE — Progress Notes (Signed)
°   11/22/21 2000  Psych Admission Type (Psych Patients Only)  Admission Status Involuntary  Psychosocial Assessment  Patient Complaints None  Eye Contact Brief  Facial Expression Sad  Affect Anxious  Speech Tangential  Interaction Assertive  Motor Activity Restless  Appearance/Hygiene Disheveled  Behavior Characteristics Cooperative  Mood Preoccupied  Thought Process  Coherency Circumstantial;Disorganized  Content Blaming self  Delusions Paranoid  Perception Hallucinations  Hallucination Auditory;Visual  Judgment Impaired  Confusion Mild  Danger to Self  Current suicidal ideation? Denies  Danger to Others  Danger to Others None reported or observed

## 2021-11-22 NOTE — Group Note (Signed)
Recreation Therapy Group Note   Group Topic:Personal Development  Group Date: 11/22/2021 Start Time: 1000 End Time: 1045 Facilitators: Caroll Rancher, LRT,CTRS Location: [address] Dayroom   Goal Area(s) Addresses:  Patient will successfully identify characteristics that define you. Patient will successfully identify positive actions and behaviors they can use post d/c.   Group Description:  Totika.  LRT introduced the game Totika to patients.  The game is played like [place].  The difference is all the blocks consist of the colors blue, orange, red and green.  Patient will pull a block and place it on top of the stack.  Which ever color the patient picks, LRT will ask them a question that corresponds with the color from deck of card.  Once patient answers the question, the next person will go.  Affect/Mood: Appropriate   Participation Level: Engaged   Participation Quality: Independent   Behavior: Appropriate   Speech/Thought Process: Coherent and Logical   Insight: Good   Judgement: Good   Modes of Intervention: Cooperative Play   Patient Response to Interventions:  Engaged   Education Outcome:  Acknowledges education and In group clarification offered    Clinical Observations/Individualized Feedback: Pt was bright, smiling and laughing along with peers.  Pt was focused and gave thought to his answers  for the questions he was asked.  Some of the answers pt gave was wanting to hang out with his deceased brother one more time, trusting his parents the most and love being an emotion everyone would feel at some point in life.  Pt was appropriate and thoughtful throughout group.    Plan: Continue to engage patient in RT group sessions 2-3x/week.   Caroll Rancher, LRT,CTRS 11/22/2021 1:38 PM

## 2021-11-22 NOTE — Progress Notes (Signed)
[facility] MD Progress Note  11/22/2021 5:07 PM LARREN COPES  MRN:  [id number] Subjective:   Jeffrey Holland is a 36 y.o. male with no known past psychiatric history, who was initially admitted for inpatient psychiatric hospitalization on 11/15/2021 for management of hallucinations and delusions.     Case was discussed in the multidisciplinary team. MAR was reviewed and patient was compliant with medications. He did not require PRN medications for agitation.     Psychiatric Team made the following recommendations yesterday:  - Decrease Zyprexa to 32m BID , intend to continue titrating down while going up on Haldol- we will continue at current dose today -Increase Haldol to 549mdaily and 10 mg nightly for residual hallucinations - Start Remeron 7.32m632mor depressed mood -  Continue Cogentin 0.32mg20md PRN  - Agitation Protocol: Haldol 32mg 38mor po q8h PRN, Ativan 1mg p33mr 2mg IM58m8 hours PRN, Benadryl 50mg IM90mpo q8 hours PRN -- New onset psychosis w/u: Negative noncontrast head CT on 11/13/2021, Hep C Ab positive; ESR 2, ceruloplasmin 20.5, HIV nonreactive, B12 348, PR nonreactive, ANA negative, TSH 0.667, UDS positive for THC, alcohol less than 10, BMP within normal limits other than a glucose of 123, white count 13.1 trending down to 10.2, AST elevated at 62 and ALT elevated at 103, B1 125.3-   Patient reports that he is not sleeping well and reports that he is having trouble staying asleep. Patient reports that he is eating well and is reluctant but admits he still see's and hears his brother at breakfast. Patient reports that his brother is saying the same thing. Patient reports that he is only hearing one voice in his head and it says the same thing as his brother. Patient does deny that he is hearing the other loud noises like bombs and explosions. Patient reports that he feels that the medical staff and provider are "messing me up w/ this stuff [ referring to medication]/" Patient reports  that he is very confused and does not understand why he is in the hospital. Patient denies SI and HI. Patient reports that he feels "like their is something on my back that I just can't shake off." Provider and patient talk about the days leading up to patient's presentation and patient clarified that he felt his mood changed before he began having AVH.   Later this afternoon: Provider observed patient while he was on the phone. Patient endorsed thought blocking to his parents. Patient also told them that the day before he came to the hospital he "woke up a different person who could, see things, hear things, and predict stuff." Patient was also noted to frequently question his parents about their support.   Spoke w/ mom and dad today: Parents reports that they can see a positive difference but endorses that the patient is still paranoid and emotionally labile. Mom endorses that she has visited patient and he is not remembering her visits. Mom confirms that both dad and deceased brother had Bipolar disorder and dad took Lithium in the past. Principal Problem: Schizophrenia spectrum disorder with psychotic disorder type not yet determined (HCC) DiaTolarsis: Principal Problem:   Schizophrenia spectrum disorder with psychotic disorder type not yet determined (HCC) [place] Problems:   Alcohol abuse   Cannabis abuse   Cocaine abuse, episodic use (HCC)   HPoynortitis C  Total Time spent with patient: 30 minutes  Past Psychiatric History:  See H& P  Past Medical History:  Past  Medical History:  Diagnosis Date   Anxiety    Asthma    Panic    History reviewed. No pertinent surgical history. Family History: History reviewed. No pertinent family history. Family Psychiatric  History: See H&P Social History:  Social History   Substance and Sexual Activity  Alcohol Use Yes     Social History   Substance and Sexual Activity  Drug Use Yes   Types: Marijuana, Methamphetamines, Cocaine    Social  History   Socioeconomic History   Marital status: Single    Spouse name: Not on file   Number of children: Not on file   Years of education: Not on file   Highest education level: Not on file  Occupational History   Not on file  Tobacco Use   Smoking status: Every Day    Packs/day: 1.00    Types: Cigarettes   Smokeless tobacco: Not on file  Vaping Use   Vaping Use: Every day  Substance and Sexual Activity   Alcohol use: Yes   Drug use: Yes    Types: Marijuana, Methamphetamines, Cocaine   Sexual activity: Yes    Birth control/protection: None  Other Topics Concern   Not on file  Social History Narrative   ** Merged History Encounter **       Social Determinants of Health   Financial Resource Strain: Not on file  Food Insecurity: Not on file  Transportation Needs: Not on file  Physical Activity: Not on file  Stress: Not on file  Social Connections: Not on file   Additional Social History:                         Sleep: Poor  Appetite:  Fair  Current Medications: Current Facility-Administered Medications  Medication Dose Route Frequency Provider Last Rate Last Admin   acetaminophen (TYLENOL) tablet 650 mg  650 mg Oral Q6H PRN Prescilla Sours, PA-C   650 mg at 11/22/21 0829   albuterol (VENTOLIN HFA) 108 (90 Base) MCG/ACT inhaler 2 puff  2 puff Inhalation Q6H PRN Harlow Asa, MD       alum & mag hydroxide-simeth (MAALOX/MYLANTA) 200-200-20 MG/5ML suspension 30 mL  30 mL Oral Q4H PRN Lovena Le, [name], PA-C       benztropine (COGENTIN) tablet 0.5 mg  0.5 mg Oral BID PRN Nelda Marseille, Amy E, MD       diphenhydrAMINE (BENADRYL) capsule 50 mg  50 mg Oral Q8H PRN Margorie John W, PA-C   50 mg at 11/20/21 2223   Or   diphenhydrAMINE (BENADRYL) injection 50 mg  50 mg Intramuscular Q8H PRN Margorie John W, PA-C   50 mg at 11/19/21 1255   famotidine (PEPCID) tablet 20 mg  20 mg Oral BID Damita Dunnings B, MD   20 mg at 11/22/21 0828   feeding supplement (ENSURE ENLIVE /  ENSURE PLUS) liquid 237 mL  237 mL Oral BID BM Dannel Rafter B, MD       haloperidol (HALDOL) tablet 15 mg  15 mg Oral Q2000 Suede Greenawalt B, MD       haloperidol (HALDOL) tablet 5 mg  5 mg Oral Q8H PRN Margorie John W, PA-C   5 mg at 11/18/21 2311   Or   haloperidol lactate (HALDOL) injection 5 mg  5 mg Intramuscular Q8H PRN Margorie John W, PA-C   5 mg at 11/19/21 1255   haloperidol (HALDOL) tablet 5 mg  5 mg Oral Q breakfast  Damita Dunnings B, MD   5 mg at 11/22/21 4008   lithium carbonate capsule 300 mg  300 mg Oral Q2000 Freida Busman, MD       LORazepam (ATIVAN) tablet 1 mg  1 mg Oral Q8H PRN Freida Busman, MD       Or   LORazepam (ATIVAN) injection 2 mg  2 mg Intramuscular Q8H PRN Damita Dunnings B, MD       magnesium hydroxide (MILK OF MAGNESIA) suspension 30 mL  30 mL Oral Daily PRN Lovena Le, [name], PA-C       mirtazapine (REMERON) tablet 7.5 mg  7.5 mg Oral QHS Damita Dunnings B, MD   7.5 mg at 11/21/21 2034   multivitamin with minerals tablet 1 tablet  1 tablet Oral Daily Harlow Asa, MD   1 tablet at 11/22/21 6761   nicotine polacrilex (NICORETTE) gum 2 mg  2 mg Oral PRN Harlow Asa, MD   2 mg at 11/19/21 1110   OLANZapine (ZYPREXA) tablet 5 mg  5 mg Oral BID Damita Dunnings B, MD       thiamine tablet 100 mg  100 mg Oral Daily Nelda Marseille, Amy E, MD   100 mg at 11/21/21 0748   traZODone (DESYREL) tablet 100 mg  100 mg Oral QHS PRN Harlow Asa, MD   100 mg at 11/21/21 2034    Lab Results:  Results for orders placed or performed during the hospital encounter of 11/15/21 (from the past 48 hour(s))  Ammonia     Status: None   Collection Time: 11/21/21  6:16 PM  Result Value Ref Range   Ammonia 14 9 - 35 umol/L    Comment: Performed at [facility], [place] [address] [place], [place] [address]    Blood Alcohol level:  Lab Results  Component Value Date   ETH <10 11/14/2021   ETH <10 26/71/2458    Metabolic Disorder Labs: Lab Results  Component Value  Date   HGBA1C 5.5 11/15/2021   MPG 111.15 11/15/2021   No results found for: PROLACTIN Lab Results  Component Value Date   CHOL 151 11/15/2021   TRIG 98 11/15/2021   HDL 42 11/15/2021   CHOLHDL 3.6 11/15/2021   VLDL 20 11/15/2021   LDLCALC 89 11/15/2021    Physical Findings: AIMS: Facial and Oral Movements Muscles of Facial Expression: None, normal Lips and Perioral Area: None, normal Jaw: None, normal Tongue: None, normal,Extremity Movements Upper (arms, wrists, hands, fingers): None, normal Lower (legs, knees, ankles, toes): None, normal, Trunk Movements Neck, shoulders, hips: None, normal, Overall Severity Severity of abnormal movements (highest score from questions above): None, normal Incapacitation due to abnormal movements: None, normal Patient's awareness of abnormal movements (rate only patient's report): No Awareness, Dental Status Current problems with teeth and/or dentures?: No Does patient usually wear dentures?: No  CIWA:  CIWA-Ar Total: 1 COWS:     Musculoskeletal: Strength & Muscle Tone: within normal limits Gait & Station: normal Patient leans: N/A  Psychiatric Specialty Exam:  Presentation  General Appearance: Casual  Eye Contact:Fleeting  Speech:Clear and Coherent  Speech Volume:Decreased  Handedness:Right   Mood and Affect  Mood:Labile; Dysphoric  Affect:Congruent   Thought Process  Thought Processes:-- (thought blocking)  Descriptions of Associations:Circumstantial  Orientation:Full (Time, Place and Person)  Thought Content:Rumination; Delusions  History of Schizophrenia/Schizoaffective disorder:No  Duration of Psychotic Symptoms:Less than six months  Hallucinations:Hallucinations: Auditory; Visual  Ideas of Reference:Delusions; Paranoia  Suicidal Thoughts:Suicidal Thoughts: No  Homicidal Thoughts:Homicidal Thoughts: No   Sensorium  Memory:Immediate Fair; Recent  Poor  Judgment:Impaired  Insight:None   Executive Functions  Concentration:Poor  Attention Span:Fair  [address]   Psychomotor Activity  Psychomotor Activity:Psychomotor Activity: Normal   Assets  Assets:Communication Skills; Resilience; Social Support   Sleep  Sleep:Sleep: Fair    Physical Exam: Physical Exam Constitutional:      Appearance: Normal appearance.  HENT:     Head: Normocephalic and atraumatic.  Pulmonary:     Effort: Pulmonary effort is normal.  Neurological:     Mental Status: He is alert.   Review of Systems  Psychiatric/Behavioral:  Positive for depression and hallucinations. Negative for suicidal ideas. The patient has insomnia.   Blood pressure 117/83, pulse 92, temperature 98 F (36.7 C), temperature source Oral, resp. rate 18, height 5' 6"  (1.676 m), weight 62.6 kg, SpO2 100 %. Body mass index is 22.27 kg/m.   Treatment Plan Summary: Daily contact with patient to assess and evaluate symptoms and progress in treatment and Medication management Calixto Pavel is a 36 yo patient w/ no known PPH who presented endorsing AVH, delusions, and depressed mood. Jaz continues to appear bizarre, with thought blocking, AVH, and paranoid. Patient appears to have a significant mood component to his psychosis as his mood continues to be labile. At this time would like to target this with a mood stabilizer that is not metabolized by the liver. Patient endorsed being aware of his father using Lithium when this was mentioned as an option. Although patient's RUQ [place] was not concerning, would not like to aggravate patient's transamnitis. Will continue to treat patient's psychosis with haldol upward titration.  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 Psychiatric Diagnoses and Treatment:              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 (r/o Substance induced psychosis vs MDD, recurrent, severe w/ psychotic features vs Schizophreniform disorder vs Bipolar disorder, current episode depressed w/ psychotic features vs psychosis secondary to general medical condition) - Decrease Zyprexa to 18m tonight and 559mBID tom. , intend to continue titrating down while going up on Haldol- we will continue at current dose today -Increase Haldol to 34m18maily and 15 mg nightly for residual hallucinations - Continue Remeron 7.34mg534mr depressed mood -  Continue Cogentin 0.34mg 47m PRN  - Start Lithium 300mg 30m - Lithium lvl 1/15 - Agitation Protocol: Haldol 34mg IM20m po q8h PRN, Ativan 1mg po 30m2mg IM q49mhours PRN, Benadryl 50mg67m  IM or po q8 hours PRN -- New onset psychosis w/u: Negative noncontrast head CT on 11/13/2021, Hep C Ab positive; ESR 2, ceruloplasmin 20.5, HIV nonreactive, B12 348, PR nonreactive, ANA negative, TSH 0.667, UDS positive for THC, alcohol less than 10, BMP within normal limits other than a glucose of 123, white count 13.1 trending down to 10.2, AST elevated at 62 and ALT elevated at 103, B1 125.3- Labs Pending:  Heavy metals --Antipsychotic lab monitoring: EKG: QTC- 432 and on repeat QTC 434m, A1c 5.5, Lipids WNL             -- Continue Trazodone 1032mqhs PRN insomnia and improved daytime sleep hygiene encouraged             -- Encouraged patient  to participate in unit milieu and in scheduled group therapies                EtoH use (r/o alcohol use d/o) Cocaine use - episodic (r/o stimulant use d/o) 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 Medical Issues Being Addressed:              Tobacco Use Disorder             -- Nicotine patch 2134m4 hours ordered             -- Smoking cessation encouraged               Hx of Gastric Ulcers -- Continue home Pepcid 31m29mD   Hep C ab positive/Transaminitis -- Hep C RNA Quant lab-353,000 - Will need f/u OP -- AST trending up to 62> 94 and ALT up to 103> 109 (has been at AST 155 and ALT 228 2 years ago) with T bili 1.4 > 0.9 and indirect bili 1.3 > 0.8 - Consulted GI, Dr. 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  - RUQ US *[place]ase see reading, recommend patient f/u OP as there is nothing acutely cocnerning   Leukocytosis - resolved -- Repeat CBC shows WBC 10.2 on 11/17/21   4. Discharge Planning:              -- Social work and case management to assist with discharge planning and identification of hospital follow-up needs prior to discharge             -- Discharge Concerns: Need to establish a safety plan; Medication compliance and effectiveness             -- Discharge Goals: Return home with outpatient referrals for mental health follow-up including medication management/psychotherapy   PGY-2 Zacory Fiola Freida Busman 11/22/2021, 5:07 PM

## 2021-11-22 NOTE — Progress Notes (Signed)
°   11/22/21 0515  °Sleep  °Number of Hours 8.25  ° ° °

## 2021-11-23 DIAGNOSIS — F29 Unspecified psychosis not due to a substance or known physiological condition: Secondary | ICD-10-CM | POA: Diagnosis not present

## 2021-11-23 MED ORDER — LITHIUM CARBONATE 300 MG PO CAPS
300.0000 mg | ORAL_CAPSULE | Freq: Once | ORAL | Status: AC
  Administered 2021-11-23: 300 mg via ORAL
  Filled 2021-11-23 (×2): qty 1

## 2021-11-23 MED ORDER — LITHIUM CARBONATE 300 MG PO CAPS
300.0000 mg | ORAL_CAPSULE | Freq: Two times a day (BID) | ORAL | Status: DC
  Administered 2021-11-23 – 2021-11-24 (×2): 300 mg via ORAL
  Filled 2021-11-23 (×6): qty 1

## 2021-11-23 MED ORDER — OLANZAPINE 5 MG PO TABS
5.0000 mg | ORAL_TABLET | Freq: Every day | ORAL | Status: DC
  Filled 2021-11-23 (×2): qty 1

## 2021-11-23 NOTE — [facility] Group Notes (Signed)
Adult Psychoeducational Group Note  Date:  11/23/2021 Time:  9:29 AM  Group Topic/Focus:  Goals Group:   The focus of this group is to help patients establish daily goals to achieve during treatment and discuss how the patient can incorporate goal setting into their daily lives to aide in recovery.  Participation Level:  Active  Participation Quality:  Appropriate  Affect:  Appropriate  Cognitive:  Appropriate  Insight: Appropriate  Engagement in Group:  Engaged  Modes of Intervention:  Discussion   Donell Beers 11/23/2021, 9:29 AM

## 2021-11-23 NOTE — Progress Notes (Signed)
[facility] MD Progress Note  11/23/2021 12:53 PM Jeffrey Holland  MRN:  [id number] Subjective:  Jeffrey Holland is a 36 y.o. male with no known past psychiatric history, who was initially admitted for inpatient psychiatric hospitalization on 11/15/2021 for management of hallucinations and delusions.     Case was discussed in the multidisciplinary team. MAR was reviewed and patient was compliant with medications. He did not require PRN medications for agitation.     Psychiatric Team made the following recommendations yesterday:  - Decrease Zyprexa to 58m tonight and 539mBID tom. , intend to continue titrating down while going up on Haldol- we will continue at current dose today -Increase Haldol to 73m4maily and 15 mg nightly for residual hallucinations - Continue Remeron 7.73mg3mr depressed mood -  Continue Cogentin 0.73mg 41m PRN  - Start Lithium 300mg 76m            - Lithium lvl 1/15 - Agitation Protocol: Haldol 73mg IM27m po q8h PRN, Ativan 1mg po 57m2mg IM q59mhours PRN, Benadryl 50mg IM o72m q8 hours PRN -- New onset psychosis w/u: Negative noncontrast head CT on 11/13/2021, Hep C Ab positive; ESR 2, ceruloplasmin 20.5, HIV nonreactive, B12 348, PR nonreactive, ANA negative, TSH 0.667, UDS positive for THC, alcohol less than 10, BMP within normal limits other than a glucose of 123, white count 13.1 trending down to 10.2, AST elevated at 62 and ALT elevated at 103, B1 125.3-   On assessment today patient reports that he is still seeing "something the looks like my brother" at breakfast. Patient reports that he is in fact still hearing multiple voices, but they are all saying the same thing, "it's going to be ok little brother." Patient reports that he is still worried about being in the hospital and does not understand why he is in the hospital. Patient does endorse that when he first became psychotic he was having racing thoughts, but is no longer having this. Patient reports that he feels that it  is very hard to think in the psychiatric hospital. Patient does deny SI and HI.  Objectively patient appears a bit less dysphoric and slept very well overnight based on recording sleep time. Despite these improvements patient is clearly still paranoid and having thought blocking.  Principal Problem: Schizophrenia spectrum disorder with psychotic disorder type not yet determined (HCC) DiagnArkansass: Principal Problem:   Schizophrenia spectrum disorder with psychotic disorder type not yet determined (HCC) ActivTrinityroblems:   Alcohol abuse   Cannabis abuse   Cocaine abuse, episodic use (HCC)   HepDunlaptis C  Total Time spent with patient: 20 minutes  Past Psychiatric History: See H&P  Past Medical History:  Past Medical History:  Diagnosis Date   Anxiety    Asthma    Panic    History reviewed. No pertinent surgical history. Family History: History reviewed. No pertinent family history. Family Psychiatric  History: See H&P Social History:  Social History   Substance and Sexual Activity  Alcohol Use Yes     Social History   Substance and Sexual Activity  Drug Use Yes   Types: Marijuana, Methamphetamines, Cocaine    Social History   Socioeconomic History   Marital status: Single    Spouse name: Not on file   Number of children: Not on file   Years of education: Not on file   Highest education level: Not on file  Occupational History   Not on file  Tobacco  Use   Smoking status: Every Day    Packs/day: 1.00    Types: Cigarettes   Smokeless tobacco: Not on file  Vaping Use   Vaping Use: Every day  Substance and Sexual Activity   Alcohol use: Yes   Drug use: Yes    Types: Marijuana, Methamphetamines, Cocaine   Sexual activity: Yes    Birth control/protection: None  Other Topics Concern   Not on file  Social History Narrative   ** Merged History Encounter **       Social Determinants of Health   Financial Resource Strain: Not on file  Food Insecurity: Not on file   Transportation Needs: Not on file  Physical Activity: Not on file  Stress: Not on file  Social Connections: Not on file   Additional Social History:                         Sleep: Good  Appetite:  Good  Current Medications: Current Facility-Administered Medications  Medication Dose Route Frequency Provider Last Rate Last Admin   acetaminophen (TYLENOL) tablet 650 mg  650 mg Oral Q6H PRN Prescilla Sours, PA-C   650 mg at 11/22/21 0829   albuterol (VENTOLIN HFA) 108 (90 Base) MCG/ACT inhaler 2 puff  2 puff Inhalation Q6H PRN Harlow Asa, MD       alum & mag hydroxide-simeth (MAALOX/MYLANTA) 200-200-20 MG/5ML suspension 30 mL  30 mL Oral Q4H PRN Lovena Le, [name], PA-C       benztropine (COGENTIN) tablet 0.5 mg  0.5 mg Oral BID PRN Nelda Marseille, Amy E, MD       diphenhydrAMINE (BENADRYL) capsule 50 mg  50 mg Oral Q8H PRN Margorie John W, PA-C   50 mg at 11/20/21 2223   Or   diphenhydrAMINE (BENADRYL) injection 50 mg  50 mg Intramuscular Q8H PRN Margorie John W, PA-C   50 mg at 11/19/21 1255   famotidine (PEPCID) tablet 20 mg  20 mg Oral BID Damita Dunnings B, MD   20 mg at 11/23/21 0759   feeding supplement (ENSURE ENLIVE / ENSURE PLUS) liquid 237 mL  237 mL Oral BID BM Damita Dunnings B, MD   237 mL at 11/23/21 0802   haloperidol (HALDOL) tablet 15 mg  15 mg Oral Q2000 Damita Dunnings B, MD   15 mg at 11/22/21 2043   haloperidol (HALDOL) tablet 5 mg  5 mg Oral Q8H PRN Margorie John W, PA-C   5 mg at 11/18/21 2311   Or   haloperidol lactate (HALDOL) injection 5 mg  5 mg Intramuscular Q8H PRN Margorie John W, PA-C   5 mg at 11/19/21 1255   haloperidol (HALDOL) tablet 5 mg  5 mg Oral Q breakfast Damita Dunnings B, MD   5 mg at 11/23/21 0759   lithium carbonate capsule 300 mg  300 mg Oral Q2000 Damita Dunnings B, MD   300 mg at 11/22/21 2043   LORazepam (ATIVAN) tablet 1 mg  1 mg Oral Q8H PRN Freida Busman, MD       Or   LORazepam (ATIVAN) injection 2 mg  2 mg Intramuscular Q8H PRN Damita Dunnings B, MD       magnesium hydroxide (MILK OF MAGNESIA) suspension 30 mL  30 mL Oral Daily PRN Margorie John W, PA-C       mirtazapine (REMERON) tablet 7.5 mg  7.5 mg Oral QHS Damita Dunnings B, MD   7.5 mg at 11/22/21 2044  multivitamin with minerals tablet 1 tablet  1 tablet Oral Daily Harlow Asa, MD   1 tablet at 11/23/21 0759   nicotine polacrilex (NICORETTE) gum 2 mg  2 mg Oral PRN Harlow Asa, MD   2 mg at 11/23/21 1247   OLANZapine (ZYPREXA) tablet 5 mg  5 mg Oral BID Damita Dunnings B, MD   5 mg at 11/23/21 0759   thiamine tablet 100 mg  100 mg Oral Daily Nelda Marseille, Amy E, MD   100 mg at 11/23/21 0801   traZODone (DESYREL) tablet 100 mg  100 mg Oral QHS PRN Harlow Asa, MD   100 mg at 11/22/21 2043    Lab Results:  Results for orders placed or performed during the hospital encounter of 11/15/21 (from the past 48 hour(s))  Ammonia     Status: None   Collection Time: 11/21/21  6:16 PM  Result Value Ref Range   Ammonia 14 9 - 35 umol/L    Comment: Performed at [facility], [place] [address] [place], [place] [address]    Blood Alcohol level:  Lab Results  Component Value Date   ETH <10 11/14/2021   ETH <10 36/14/4315    Metabolic Disorder Labs: Lab Results  Component Value Date   HGBA1C 5.5 11/15/2021   MPG 111.15 11/15/2021   No results found for: PROLACTIN Lab Results  Component Value Date   CHOL 151 11/15/2021   TRIG 98 11/15/2021   HDL 42 11/15/2021   CHOLHDL 3.6 11/15/2021   VLDL 20 11/15/2021   LDLCALC 89 11/15/2021    Physical Findings: AIMS: Facial and Oral Movements Muscles of Facial Expression: None, normal Lips and Perioral Area: None, normal Jaw: None, normal Tongue: None, normal,Extremity Movements Upper (arms, wrists, hands, fingers): None, normal Lower (legs, knees, ankles, toes): None, normal, Trunk Movements Neck, shoulders, hips: None, normal, Overall Severity Severity of abnormal movements (highest score from  questions above): None, normal Incapacitation due to abnormal movements: None, normal Patient's awareness of abnormal movements (rate only patient's report): No Awareness, Dental Status Current problems with teeth and/or dentures?: No Does patient usually wear dentures?: No  CIWA:  CIWA-Ar Total: 2 COWS:     Musculoskeletal: Strength & Muscle Tone: within normal limits Gait & Station: normal Patient leans: N/A  Psychiatric Specialty Exam:  Presentation  General Appearance: Casual  Eye Contact:Fleeting  Speech:Clear and Coherent  Speech Volume:Decreased  Handedness:Right   Mood and Affect  Mood:Anxious; Dysphoric  Affect:Congruent   Thought Process  Thought Processes:Goal Directed (thought blocking)  Descriptions of Associations:Circumstantial  Orientation:Full (Time, Place and Person)  Thought Content:Illogical; Rumination  History of Schizophrenia/Schizoaffective disorder:No  Duration of Psychotic Symptoms:Less than six months  Hallucinations:Hallucinations: Auditory; Visual  Ideas of Reference:Paranoia  Suicidal Thoughts:Suicidal Thoughts: No  Homicidal Thoughts:Homicidal Thoughts: No   Sensorium  Memory:Immediate Good; Recent Good  Judgment:Impaired  Insight:None   Executive Functions  Concentration:Poor  Attention Span:Poor  Weaver   Psychomotor Activity  Psychomotor Activity:Psychomotor Activity: Normal   Assets  Assets:Resilience; Social Support; Housing   Sleep  Sleep:Sleep: Good    Physical Exam: Physical Exam Constitutional:      Comments: More lively overall appearance  HENT:     Head: Normocephalic and atraumatic.  Neurological:     Mental Status: He is alert.   Review of Systems  Psychiatric/Behavioral:  Positive for depression and hallucinations. Negative for suicidal ideas. The patient is nervous/anxious. The patient does not have insomnia.  Blood pressure  114/83, pulse 88, temperature (!) 97.4 F (36.3 C), temperature source Oral, resp. rate 18, height 5' 6"  (1.676 m), weight 62.6 kg, SpO2 97 %. Body mass index is 22.27 kg/m.   Treatment Plan Summary: Daily contact with patient to assess and evaluate symptoms and progress in treatment and Medication management Jeffrey Holland is a 36 yo patient w/ no known PPH who presented endorsing AVH, delusions, and depressed mood. Jeffrey Holland continues to appear bizarre, with thought blocking, AVH, and paranoid. Jeffrey Holland does not appear to have any adverse side effects to his Lithium and is sleeping better. Patient's mood was also slightly more stable today. Patient would benefit from increase in Li and continuing his Haldol and decreasing the Zyprexa.  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 Psychiatric Diagnoses and Treatment:              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 (r/o Substance induced psychosis vs MDD, recurrent, severe w/ psychotic features vs Schizophreniform disorder vs Bipolar disorder, current episode depressed w/ psychotic features vs psychosis secondary to general medical condition) - Decrease Zyprexa to 14m daily. , intend to continue titrating down while going up on Haldol- -Continue Haldol to 547mdaily and 15 mg nightly for residual  hallucinations - Continue Remeron 7.29m6mor depressed mood -  Continue Cogentin 0.29mg11md PRN  - Increase Lithium to 300mg69m             - Lithium lvl 1/15 - Agitation Protocol: Haldol 29mg I57mr po q8h PRN, Ativan 1mg po54m 2mg IM 69m hours PRN, Benadryl 50mg IM 38mo q8 hours PRN -- New onset psychosis w/u: Negative noncontrast head CT on 11/13/2021, Hep C Ab positive; ESR 2, ceruloplasmin 20.5, HIV nonreactive, B12 348, PR nonreactive, ANA negative, TSH 0.667, UDS positive for THC, alcohol less than 10, BMP within normal limits other than a glucose of 123, white count 13.1 trending down to 10.2, AST elevated at 62 and ALT elevated at 103, B1 125.3- Labs Pending:  Heavy metals --Antipsychotic lab monitoring: EKG: QTC- 432 and on repeat QTC 403ms, A1c10m, Lipids WNL             -- Continue Trazodone 100mg qhs P729mnsomnia and improved daytime sleep hygiene encouraged             -- Encouraged patient to participate in unit milieu and in scheduled group therapies                EtoH use (r/o alcohol use d/o) Cocaine  use - episodic (r/o stimulant use d/o) 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 Medical Issues Being Addressed:              Tobacco Use Disorder             -- Nicotine patch 62m/24 hours ordered             -- Smoking cessation encouraged               Hx of Gastric Ulcers -- Continue home Pepcid 216mBID   Hep C ab positive/Transaminitis -- Hep C RNA Quant lab-353,000 - Will need f/u OP -- AST trending up to 62> 94 and ALT up to 103> 109 (has been at AST 155 and ALT 228 2 years ago) with T bili 1.4 > 0.9 and indirect bili 1.3 > 0.8 - Previously Consulted GI, Dr. 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  - RUQ [place] please  see reading, recommend patient f/u OP as there is nothing acutely concerning, SW to schedule PCP   Leukocytosis - resolved -- Repeat CBC shows WBC 10.2 on 11/17/21   4. Discharge Planning:              -- Social work and case management to assist with discharge planning and identification of hospital follow-up needs prior to discharge             -- Discharge Concerns: Need to establish a safety plan; Medication compliance and effectiveness             -- Discharge Goals: Return home with outpatient referrals for mental health follow-up including medication management/psychotherapy    PGY-2 JaFreida BusmanMD 11/23/2021, 12:53 PM

## 2021-11-23 NOTE — Progress Notes (Signed)
°   11/23/21 2000  Psych Admission Type (Psych Patients Only)  Admission Status Involuntary  Psychosocial Assessment  Patient Complaints None  Eye Contact Brief  Facial Expression Sad  Affect Anxious  Speech Tangential  Interaction Assertive  Motor Activity Restless  Appearance/Hygiene Disheveled  Behavior Characteristics Cooperative  Mood Preoccupied  Thought Process  Coherency Circumstantial;Disorganized  Content Blaming self  Delusions Paranoid  Perception Hallucinations  Hallucination Auditory;Visual  Judgment Impaired  Confusion Mild  Danger to Self  Current suicidal ideation? Denies  Danger to Others  Danger to Others None reported or observed

## 2021-11-23 NOTE — Progress Notes (Signed)
Psychoeducational Group Note  Date:  11/23/2021 Time:  2055  Group Topic/Focus:  Wrap-Up Group:   The focus of this group is to help patients review their daily goal of treatment and discuss progress on daily workbooks.  Participation Level: Did Not Attend  Participation Quality:  Not Applicable  Affect:  Not Applicable  Cognitive:  Not Applicable  Insight:  Not Applicable  Engagement in Group: Not Applicable  Additional Comments:  The patient did not attend group this evening.   Hazle Coca S 11/23/2021, 8:55 PM

## 2021-11-23 NOTE — Progress Notes (Signed)
°   11/23/21 1400  Psych Admission Type (Psych Patients Only)  Admission Status Involuntary  Psychosocial Assessment  Patient Complaints None  Eye Contact Brief  Facial Expression Sad  Affect Anxious  Speech Tangential  Interaction Assertive  Motor Activity Restless  Appearance/Hygiene Disheveled  Behavior Characteristics Cooperative  Mood Preoccupied  Thought Process  Coherency Circumstantial;Disorganized  Content Blaming self  Delusions Paranoid  Perception Hallucinations  Hallucination Auditory;Visual  Judgment Impaired  Confusion Mild  Danger to Self  Current suicidal ideation? Denies  Danger to Others  Danger to Others None reported or observed

## 2021-11-23 NOTE — Group Note (Signed)
Recreation Therapy Group Note   Group Topic:Coping Skills  Group Date: 11/23/2021 Start Time: 0950 End Time: 1030 Facilitators: Caroll Rancher, LRT,CTRS Location: [address] Day[address]   Goal Area(s) Addresses: Patient will define what a coping skill is. Patient will successfully identify positive coping skills they can use post d/c.  Patient will acknowledge benefit(s) of using learned coping skills post d/c.   Group Description: Coping A to Z. Patient asked to identify what a coping skill is and when they use them. Patients with Clinical research associate discussed healthy versus unhealthy coping skills. Next patients were given a blank worksheet titled "Coping Skills A-Z". Patients were instructed to come up with at least one positive coping skill per letter of the alphabet. Patients were given 15 minutes to complete task, before ideas were presented to the large group. Patients and LRT debriefed on the importance of coping skill selection based on situation and back-up plans when a skill tried is not effective.    Affect/Mood: N/A   Participation Level: Did not attend    Clinical Observations/Individualized Feedback:     Plan: Continue to engage patient in RT group sessions 2-3x/week.   Caroll Rancher, LRT,CTRS 11/23/2021 11:04 AM

## 2021-11-23 NOTE — Progress Notes (Signed)
°   11/23/21 0530  Sleep  Number of Hours 8.75

## 2021-11-24 ENCOUNTER — Encounter (HOSPITAL_COMMUNITY): Payer: Self-pay

## 2021-11-24 DIAGNOSIS — F29 Unspecified psychosis not due to a substance or known physiological condition: Secondary | ICD-10-CM | POA: Diagnosis not present

## 2021-11-24 MED ORDER — NICOTINE POLACRILEX 2 MG MT GUM
2.0000 mg | CHEWING_GUM | Freq: Three times a day (TID) | OROMUCOSAL | Status: DC
  Administered 2021-11-24 – 2021-11-25 (×4): 2 mg via ORAL
  Filled 2021-11-24 (×8): qty 1

## 2021-11-24 MED ORDER — LITHIUM CARBONATE 150 MG PO CAPS
450.0000 mg | ORAL_CAPSULE | Freq: Two times a day (BID) | ORAL | Status: DC
  Administered 2021-11-24 – 2021-11-29 (×10): 450 mg via ORAL
  Filled 2021-11-24 (×15): qty 3

## 2021-11-24 NOTE — BH IP Treatment Plan (Signed)
Interdisciplinary Treatment and Diagnostic Plan Update  11/24/2021 Jeffrey Holland MRN: [id number]  Principal Diagnosis: Schizophrenia spectrum disorder with psychotic disorder type not yet determined [facility])  Secondary Diagnoses: Principal Problem:   Schizophrenia spectrum disorder with psychotic disorder type not yet determined (HCC) Active Problems:   Alcohol abuse   Cannabis abuse   Cocaine abuse, episodic use (HCC)   Hepatitis C   Current Medications:  Current Facility-Administered Medications  Medication Dose Route Frequency Provider Last Rate Last Admin   acetaminophen (TYLENOL) tablet 650 mg  650 mg Oral Q6H PRN [name], PA-C   650 mg at 11/23/21 1330   albuterol (VENTOLIN HFA) 108 (90 Base) MCG/ACT inhaler 2 puff  2 puff Inhalation Q6H PRN [name], MD       alum & mag hydroxide-simeth (MAALOX/MYLANTA) 200-200-20 MG/5ML suspension 30 mL  30 mL Oral Q4H PRN [name], [name], PA-C       benztropine (COGENTIN) tablet 0.5 mg  0.5 mg Oral BID PRN [name], MD       diphenhydrAMINE (BENADRYL) capsule 50 mg  50 mg Oral Q8H PRN [name], PA-C   50 mg at 11/20/21 2223   Or   diphenhydrAMINE (BENADRYL) injection 50 mg  50 mg Intramuscular Q8H PRN [name], PA-C   50 mg at 11/19/21 1255   famotidine (PEPCID) tablet 20 mg  20 mg Oral BID [name], MD   20 mg at 11/24/21 0805   feeding supplement (ENSURE ENLIVE / ENSURE PLUS) liquid 237 mL  237 mL Oral BID BM McQuilla, Gerlean Ren B, MD   237 mL at 11/24/21 1122   haloperidol (HALDOL) tablet 15 mg  15 mg Oral Q2000 [name], MD   15 mg at 11/23/21 2043   haloperidol (HALDOL) tablet 5 mg  5 mg Oral Q8H PRN [name], PA-C   5 mg at 11/18/21 2311   Or   haloperidol lactate (HALDOL) injection 5 mg  5 mg Intramuscular Q8H PRN [name], PA-C   5 mg at 11/19/21 1255   haloperidol (HALDOL) tablet 5 mg  5 mg Oral Q breakfast [name], MD   5 mg at 11/24/21 0805   lithium carbonate capsule  300 mg  300 mg Oral BID WC [name], MD   300 mg at 11/24/21 0805   LORazepam (ATIVAN) tablet 1 mg  1 mg Oral Q8H PRN [name], MD       Or   LORazepam (ATIVAN) injection 2 mg  2 mg Intramuscular Q8H PRN [name], MD       magnesium hydroxide (MILK OF MAGNESIA) suspension 30 mL  30 mL Oral Daily PRN [name], PA-C       mirtazapine (REMERON) tablet 7.5 mg  7.5 mg Oral QHS [name], MD   7.5 mg at 11/23/21 2044   multivitamin with minerals tablet 1 tablet  1 tablet Oral Daily [name], MD   1 tablet at 11/24/21 0805   nicotine polacrilex (NICORETTE) gum 2 mg  2 mg Oral PRN [name], MD   2 mg at 11/24/21 1121   thiamine tablet 100 mg  100 mg Oral Daily Mason Jim, Amy E, MD   100 mg at 11/24/21 0805   traZODone (DESYREL) tablet 100 mg  100 mg Oral QHS PRN [name], MD   100 mg at 11/23/21 2044   PTA Medications: Medications Prior  to Admission  Medication Sig Dispense Refill Last Dose   famotidine (PEPCID) 20 MG tablet Take 1 tablet (20 mg total) by mouth 2 (two) times daily. (Patient not taking: Reported on 01/06/2016) 30 tablet 0    lansoprazole (PREVACID) 30 MG capsule Take 1 capsule (30 mg total) by mouth daily at 12 noon. (Patient not taking: Reported on 08/19/2019) 30 capsule 0    ondansetron (ZOFRAN) 4 MG tablet Take 1 tablet (4 mg total) by mouth every 8 (eight) hours as needed for nausea or vomiting. (Patient not taking: Reported on 08/19/2019) 12 tablet 0    sucralfate (CARAFATE) 1 g tablet Take 1 tablet (1 g total) by mouth 3 (three) times daily as needed (epigastric pain). (Patient not taking: Reported on 08/19/2019) 30 tablet 0     Patient Stressors: Marital or family conflict   Medication change or noncompliance    Patient Strengths: Automotive engineerGeneral fund of knowledge  Motivation for treatment/growth   Treatment Modalities: Medication Management, Group therapy, Case management,  1 to 1 session with clinician, Psychoeducation,  Recreational therapy.   Physician Treatment Plan for Primary Diagnosis: Schizophrenia spectrum disorder with psychotic disorder type not yet determined (HCC) Long Term Goal(s): Improvement in symptoms so as ready for discharge   Short Term Goals: Ability to identify changes in lifestyle to reduce recurrence of condition will improve Ability to verbalize feelings will improve Ability to disclose and discuss suicidal ideas Ability to identify triggers associated with substance abuse/mental health issues will improve  Medication Management: Evaluate patient's response, side effects, and tolerance of medication regimen.  Therapeutic Interventions: 1 to 1 sessions, Unit Group sessions and Medication administration.  Evaluation of Outcomes: Progressing  Physician Treatment Plan for Secondary Diagnosis: Principal Problem:   Schizophrenia spectrum disorder with psychotic disorder type not yet determined (HCC) Active Problems:   Alcohol abuse   Cannabis abuse   Cocaine abuse, episodic use (HCC)   Hepatitis C  Long Term Goal(s): Improvement in symptoms so as ready for discharge   Short Term Goals: Ability to identify changes in lifestyle to reduce recurrence of condition will improve Ability to verbalize feelings will improve Ability to disclose and discuss suicidal ideas Ability to identify triggers associated with substance abuse/mental health issues will improve     Medication Management: Evaluate patient's response, side effects, and tolerance of medication regimen.  Therapeutic Interventions: 1 to 1 sessions, Unit Group sessions and Medication administration.  Evaluation of Outcomes: Progressing   RN Treatment Plan for Primary Diagnosis: Schizophrenia spectrum disorder with psychotic disorder type not yet determined (HCC) Long Term Goal(s): Knowledge of disease and therapeutic regimen to maintain health will improve  Short Term Goals: Ability to participate in decision making  will improve, Ability to verbalize feelings will improve, and Ability to identify and develop effective coping behaviors will improve  Medication Management: RN will administer medications as ordered by provider, will assess and evaluate patient's response and provide education to patient for prescribed medication. RN will report any adverse and/or side effects to prescribing provider.  Therapeutic Interventions: 1 on 1 counseling sessions, Psychoeducation, Medication administration, Evaluate responses to treatment, Monitor vital signs and CBGs as ordered, Perform/monitor CIWA, COWS, AIMS and Fall Risk screenings as ordered, Perform wound care treatments as ordered.  Evaluation of Outcomes: Progressing   LCSW Treatment Plan for Primary Diagnosis: Schizophrenia spectrum disorder with psychotic disorder type not yet determined (HCC) Long Term Goal(s): Safe transition to appropriate next level of care at discharge, Engage patient in therapeutic group addressing  interpersonal concerns.  Short Term Goals: Engage patient in aftercare planning with referrals and resources, Increase emotional regulation, and Facilitate acceptance of mental health diagnosis and concerns  Therapeutic Interventions: Assess for all discharge needs, 1 to 1 time with Social worker, Explore available resources and support systems, Assess for adequacy in community support network, Educate family and significant other(s) on suicide prevention, Complete Psychosocial Assessment, Interpersonal group therapy.  Evaluation of Outcomes: Progressing   Progress in Treatment: Attending groups: Yes. Participating in groups: Yes. Taking medication as prescribed: Yes. Toleration medication: Yes. Family/Significant other contact made: Yes, individual(s) contacted:  mother Patient understands diagnosis: Yes. Discussing patient identified problems/goals with staff: Yes. Medical problems stabilized or resolved: Yes. Denies  suicidal/homicidal ideation: Yes. Issues/concerns per patient self-inventory: No. Other: None  New problem(s) identified: No, Describe:  None  New Short Term/Long Term Goal(s):medication stabilization, elimination of SI thoughts, development of comprehensive mental wellness plan.   Patient Goals:   "To stop all bad thoughts and look forward to the future"  Discharge Plan or Barriers: Pt will follow up at [facility] for medication management and therapy.  Reason for Continuation of Hospitalization: Delusions  Hallucinations Medication stabilization  Estimated Length of Stay: 3-5 days   Scribe for Treatment Team: Chrys Racer 11/24/2021 11:55 AM

## 2021-11-24 NOTE — Progress Notes (Signed)
°   11/24/21 2000  Psych Admission Type (Psych Patients Only)  Admission Status Involuntary  Psychosocial Assessment  Patient Complaints None  Eye Contact Brief  Facial Expression Sad  Affect Anxious  Speech Tangential  Interaction Assertive  Motor Activity Restless  Appearance/Hygiene Disheveled  Behavior Characteristics Cooperative  Mood Preoccupied;Suspicious  Thought Process  Coherency Circumstantial;Disorganized  Content Blaming self  Delusions Paranoid  Perception Hallucinations  Hallucination Auditory;Visual  Judgment Impaired  Confusion Mild  Danger to Self  Current suicidal ideation? Denies  Danger to Others  Danger to Others None reported or observed

## 2021-11-24 NOTE — Progress Notes (Signed)
Chaplain responded to the spiritual care consult for Jeffrey Holland.  He asked to be baptized and forgiven for his wrongs.  He stated that he wants to do better and wants "to be saved."  Chaplain stated that she usually doesn't perform baptism at the hospital as that is something that occurs within a faith community, but she offered to pray with patient.  He was grateful and chaplain prayed with him stating his need and desire to feel forgiven.  [place], Bcc Pager, [phone] 2:15 PM

## 2021-11-24 NOTE — [facility] Group Notes (Signed)
Spirituality group facilitated by Kathleen Argue, BCC.   Group Description: Group focused on topic of hope. Patients participated in facilitated discussion around topic, connecting with one another around experiences and definitions for hope. Group members engaged with visual explorer photos, reflecting on what hope looks like for them today. Group engaged in discussion around how their definitions of hope are present today in hospital.   Modalities: Psycho-social ed, Adlerian, Narrative, MI   Patient Progress: Jeffrey Holland came late to group and was attentive.  He participated in conversation.  Chaplain Dyanne Carrel, Bcc Pager, [phone] 2:07 PM

## 2021-11-24 NOTE — Progress Notes (Signed)
°   11/24/21 0545  Sleep  Number of Hours 8.5

## 2021-11-24 NOTE — Progress Notes (Addendum)
[facility] MD Progress Note  11/24/2021 3:12 PM Jeffrey Holland  MRN:  [id number] Subjective:  Jeffrey Holland is a 36 y.o. male with no known past psychiatric history, who was initially admitted for inpatient psychiatric hospitalization on 11/15/2021 for management of hallucinations and delusions.     Case was discussed in the multidisciplinary team. MAR was reviewed and patient was compliant with medications. He did not require PRN medications for agitation.     Psychiatric Team made the following recommendations yesterday:  - Decrease Zyprexa to 63m daily. , intend to continue titrating down while going up on Haldol- -Continue Haldol to 529mdaily and 15 mg nightly for residual hallucinations - Continue Remeron 7.36m56mor depressed mood -  Continue Cogentin 0.36mg76md PRN  - Increase Lithium to 300mg48m             - Lithium lvl 1/15 - Agitation Protocol: Haldol 36mg I636mr po q8h PRN, Ativan 1mg po53m 2mg IM 24m hours PRN, Benadryl 50mg IM 23mo q8 hours PRN  Objectively patient continues to appear a bit paranoid. Patient is also having a hard time keeping track of his folder for group therapy and endorsed beliefs that "someone" is moving it. Multiple staff members have endorsed that patient appears to have some memory issues as he has trouble remembering things that were told to him within a few minutes. Provider saw patient in the PM today and noticed that patient appeared to have less thought blocking at this time. Patient also appears to have more emotional regulation. Patient reports he slept very well and is eating well. Patient reports that he did not see his brother at breakfast this AM. Patient reports that he wants to be "honest" and that he is hearing voices. Patient endorses that he hears multiple voices but now identifies that he believes that the voices are those of his family members. Patient reports that the voices are saying "it's going to be ok, love you, don't lie." Patient denies  SI today and says that his mood has improved some. Patient denies HI as well.  Principal Problem: Schizophrenia spectrum disorder with psychotic disorder type not yet determined (HCC) DiagOlivetteis: Principal Problem:   Schizophrenia spectrum disorder with psychotic disorder type not yet determined (HCC) ActiLa Loma de FalconProblems:   Alcohol abuse   Cannabis abuse   Cocaine abuse, episodic use (HCC)   He[place]unroe Fallsitis C  Total Time spent with patient: 15 minutes  Past Psychiatric History: See H&P  Past Medical History:  Past Medical History:  Diagnosis Date   Anxiety    Asthma    Panic    History reviewed. No pertinent surgical history. Family History: History reviewed. No pertinent family history. Family Psychiatric  History:  See H&P Social History:  Social History   Substance and Sexual Activity  Alcohol Use Yes     Social History   Substance and Sexual Activity  Drug Use Yes   Types: Marijuana, Methamphetamines, Cocaine    Social History   Socioeconomic History   Marital status: Single    Spouse name: Not on file   Number of children: Not on file   Years of education: Not on file   Highest education level: Not on file  Occupational History   Not on file  Tobacco Use   Smoking status: Every Day    Packs/day: 1.00    Types: Cigarettes   Smokeless tobacco: Not on file  Vaping Use   Vaping Use: Every day  Substance and Sexual Activity   Alcohol use: Yes   Drug use: Yes    Types: Marijuana, Methamphetamines, Cocaine   Sexual activity: Yes    Birth control/protection: None  Other Topics Concern   Not on file  Social History Narrative   ** Merged History Encounter **       Social Determinants of Health   Financial Resource Strain: Not on file  Food Insecurity: Not on file  Transportation Needs: Not on file  Physical Activity: Not on file  Stress: Not on file  Social Connections: Not on file   Additional Social History:                         Sleep:  Good  Appetite:  Good  Current Medications: Current Facility-Administered Medications  Medication Dose Route Frequency Provider Last Rate Last Admin   acetaminophen (TYLENOL) tablet 650 mg  650 mg Oral Q6H PRN Margorie John W, PA-C   650 mg at 11/23/21 1330   albuterol (VENTOLIN HFA) 108 (90 Base) MCG/ACT inhaler 2 puff  2 puff Inhalation Q6H PRN Harlow Asa, MD       alum & mag hydroxide-simeth (MAALOX/MYLANTA) 200-200-20 MG/5ML suspension 30 mL  30 mL Oral Q4H PRN Lovena Le, [name], PA-C       benztropine (COGENTIN) tablet 0.5 mg  0.5 mg Oral BID PRN Nelda Marseille, Amy E, MD       diphenhydrAMINE (BENADRYL) capsule 50 mg  50 mg Oral Q8H PRN Margorie John W, PA-C   50 mg at 11/20/21 2223   Or   diphenhydrAMINE (BENADRYL) injection 50 mg  50 mg Intramuscular Q8H PRN Margorie John W, PA-C   50 mg at 11/19/21 1255   famotidine (PEPCID) tablet 20 mg  20 mg Oral BID Damita Dunnings B, MD   20 mg at 11/24/21 0805   feeding supplement (ENSURE ENLIVE / ENSURE PLUS) liquid 237 mL  237 mL Oral BID BM Kennen Stammer, Joyce Gross B, MD   237 mL at 11/24/21 1326   haloperidol (HALDOL) tablet 15 mg  15 mg Oral Q2000 Damita Dunnings B, MD   15 mg at 11/23/21 2043   haloperidol (HALDOL) tablet 5 mg  5 mg Oral Q8H PRN Prescilla Sours, PA-C   5 mg at 11/18/21 2311   Or   haloperidol lactate (HALDOL) injection 5 mg  5 mg Intramuscular Q8H PRN Margorie John W, PA-C   5 mg at 11/19/21 1255   haloperidol (HALDOL) tablet 5 mg  5 mg Oral Q breakfast Damita Dunnings B, MD   5 mg at 11/24/21 0805   lithium carbonate capsule 450 mg  450 mg Oral BID WC Freida Busman, MD       LORazepam (ATIVAN) tablet 1 mg  1 mg Oral Q8H PRN Freida Busman, MD       Or   LORazepam (ATIVAN) injection 2 mg  2 mg Intramuscular Q8H PRN Damita Dunnings B, MD       magnesium hydroxide (MILK OF MAGNESIA) suspension 30 mL  30 mL Oral Daily PRN Margorie John W, PA-C       mirtazapine (REMERON) tablet 7.5 mg  7.5 mg Oral QHS Damita Dunnings B, MD   7.5 mg at 11/23/21 2044    multivitamin with minerals tablet 1 tablet  1 tablet Oral Daily Harlow Asa, MD   1 tablet at 11/24/21 0805   nicotine polacrilex (NICORETTE) gum 2 mg  2 mg Oral  TID Damita Dunnings B, MD       thiamine tablet 100 mg  100 mg Oral Daily Nelda Marseille, Amy E, MD   100 mg at 11/24/21 0805   traZODone (DESYREL) tablet 100 mg  100 mg Oral QHS PRN Harlow Asa, MD   100 mg at 11/23/21 2044    Lab Results: No results found for this or any previous visit (from the past 48 hour(s)).  Blood Alcohol level:  Lab Results  Component Value Date   ETH <10 11/14/2021   ETH <10 51/12/5850    Metabolic Disorder Labs: Lab Results  Component Value Date   HGBA1C 5.5 11/15/2021   MPG 111.15 11/15/2021   No results found for: PROLACTIN Lab Results  Component Value Date   CHOL 151 11/15/2021   TRIG 98 11/15/2021   HDL 42 11/15/2021   CHOLHDL 3.6 11/15/2021   VLDL 20 11/15/2021   LDLCALC 89 11/15/2021    Physical Findings: AIMS: Facial and Oral Movements Muscles of Facial Expression: None, normal Lips and Perioral Area: None, normal Jaw: None, normal Tongue: None, normal,Extremity Movements Upper (arms, wrists, hands, fingers): None, normal Lower (legs, knees, ankles, toes): None, normal, Trunk Movements Neck, shoulders, hips: None, normal, Overall Severity Severity of abnormal movements (highest score from questions above): None, normal Incapacitation due to abnormal movements: None, normal Patient's awareness of abnormal movements (rate only patient's report): No Awareness, Dental Status Current problems with teeth and/or dentures?: No Does patient usually wear dentures?: No  CIWA:  CIWA-Ar Total: 0 COWS:     Musculoskeletal: Strength & Muscle Tone: within normal limits Gait & Station: normal Patient leans: N/A  Psychiatric Specialty Exam:  Presentation  General Appearance: Appropriate for Environment  Eye Contact:Good  Speech:Clear and Coherent  Speech  Volume:Normal  Handedness:Right   Mood and Affect  Mood:-- (less depressed)  Affect:Congruent   Thought Process  Thought Processes:Goal Directed  Descriptions of Associations:Circumstantial  Orientation:Full (Time, Place and Person)  Thought Content:Delusions  History of Schizophrenia/Schizoaffective disorder:No  Duration of Psychotic Symptoms:Less than six months  Hallucinations:Hallucinations: Auditory  Ideas of Reference:Paranoia  Suicidal Thoughts:Suicidal Thoughts: No  Homicidal Thoughts:Homicidal Thoughts: No   Sensorium  Memory:Immediate Fair  Judgment:Impaired  Insight:None   Executive Functions  Concentration:Poor  Attention Span:Poor  [place] of Knowledge:Poor  Language:Fair   Psychomotor Activity  Psychomotor Activity:Psychomotor Activity: Normal   Assets  Assets:Desire for Improvement; Resilience; Housing; Social Support   Sleep  Sleep:Sleep: Good    Physical Exam: Physical Exam Constitutional:      Appearance: Normal appearance.  HENT:     Head: Normocephalic and atraumatic.  Pulmonary:     Effort: Pulmonary effort is normal.  Neurological:     Mental Status: He is alert.   Review of Systems  Psychiatric/Behavioral:  Positive for hallucinations. Negative for suicidal ideas. The patient does not have insomnia.   Blood pressure 107/77, pulse (!) 114, temperature 97.9 F (36.6 C), temperature source Oral, resp. rate 16, height '5\' 6"'  (1.676 m), weight 62.6 kg, SpO2 99 %. Body mass index is 22.27 kg/m.   Treatment Plan Summary: Daily contact with patient to assess and evaluate symptoms and progress in treatment and Medication management Jeffrey Holland is a 36 yo patient w/ no known PPH who presented endorsing AVH, delusions, and depressed mood. Jeffrey Holland continues to appear bizarre, with thought blocking, AVH, and paranoid. Patient appears to be responding well to [place] he appears to have more positive affect. Will  see patient again later in  the day tom, as it is possible that patient is not a morning person. Patient did not appear to have as much thought blocking in the PM. Patient is still paranoid but less preservative and appears a bit more comfortable around staff. 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 Psychiatric Diagnoses and Treatment:              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 (r/o Substance induced psychosis vs MDD, recurrent, severe w/ psychotic features vs Schizophreniform disorder vs Bipolar disorder, current episode depressed w/ psychotic features vs psychosis secondary to general medical condition) -Discontinue Zyprexa -Continue Haldol to 53m daily and 15 mg nightly for residual hallucinations - Continue Remeron 7.550mfor depressed mood -  Continue Cogentin 0.57m41mid PRN  - Increase Lithium to 450m757mD             - Lithium lvl 1/15 - Agitation Protocol: Haldol 57mg 86mor po q8h PRN, Ativan 1mg p67mr 2mg IM52m8 hours PRN, Benadryl 50mg IM62mpo q8 hours PRN -- New onset psychosis w/u: Negative noncontrast head CT on 11/13/2021, Hep C Ab positive; ESR 2, ceruloplasmin 20.5, HIV nonreactive, B12 348, PR nonreactive, ANA negative, TSH 0.667, UDS positive for THC, alcohol less than 10, BMP within normal limits other than a glucose of 123,  white count 13.1 trending down to 10.2, AST elevated at 62 and ALT elevated at 103, B1 125.3- Labs Pending:  Heavy metals --Antipsychotic lab monitoring: EKG: QTC- 432 and on repeat QTC 403ms, A140m5, Lipids WNL             -- Continue Trazodone 100mg qhs 48minsomnia and improved daytime sleep hygiene encouraged             -- Encouraged patient to participate in unit milieu and in scheduled group therapies                Jeffrey Holland use (r/o alcohol use d/o) Cocaine use - episodic (r/o stimulant use d/o) 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 Medical Issues Being Addressed:              Tobacco Use Disorder             -- Nicotine patch 21mg/24 ho46mordered, scheduled             --  Smoking cessation encouraged               Hx of Gastric Ulcers -- Continue home Pepcid 80m BID   Hep C ab positive/Transaminitis -- Hep C RNA Quant lab-353,000 - Will need f/u OP -- AST trending up to 62> 94 and ALT up to 103> 109 (has been at AST 155 and ALT 228 2 years ago) with T bili 1.4 > 0.9 and indirect bili 1.3 > 0.8 - Previously Consulted GI, Dr. 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  - RUQ [place]* please see reading, recommend patient f/u OP as there is nothing acutely concerning, SW to schedule PCP   Leukocytosis - resolved -- Repeat CBC shows WBC 10.2 on 11/17/21   4. Discharge Planning:              -- Social work and case management to assist with discharge planning and identification of hospital follow-up needs prior to discharge             -- Discharge Concerns: Need to establish a safety plan; Medication compliance and effectiveness             -- Discharge Goals: Return home with outpatient referrals for mental health follow-up including  medication management/psychotherapy    PGY-2 JFreida Busman MD 11/24/2021, 3:12 PM

## 2021-11-24 NOTE — Group Note (Signed)
Recreation Therapy Group Note   Group Topic:Team Building  Group Date: 11/24/2021 Start Time: 1000 End Time: 1020 Facilitators: Victorino Sparrow, LRT,CTRS Location: [address] Dayroom   Goal Area(s) Addresses:  Patient will effectively work with peer towards shared goal.  Patient will identify skills used to make activity successful.  Patient will identify how skills used during activity can be used to reach post d/c goals.    Group Description:  Landing Pad. In teams of 3-5, patients were given 12 plastic drinking straws and an equal length of masking tape. Using the materials provided, patients were asked to build a landing pad to catch a golf ball dropped from approximately 5 feet in the air. All materials were required to be used by the team in their design. LRT facilitated post-activity discussion.   Affect/Mood: Happy   Participation Level: Engaged   Participation Quality: Independent   Behavior: Appropriate and Cooperative   Speech/Thought Process: Logical   Insight: Moderate   Judgement: Moderate   Modes of Intervention: STEM Activity   Patient Response to Interventions:  Engaged   Education Outcome:  Acknowledges education and In group clarification offered    Clinical Observations/Individualized Feedback: Pt was bright and would smile throughout group session.  Pt worked well with partner.  During processing, pt stated he and his partner had to use their minds, hands and the materials to complete the activity.  Pt also expressed teamwork by handing materials to each other.  Pt went on to say the support system has to work as a team.  Pt became tearful at one point during processing but was able to pull himself together.  Pt appeared to be proud he and his partner were able to complete the activity.     Plan: Continue to engage patient in RT group sessions 2-3x/week.   Victorino Sparrow, LRT,CTRS 11/24/2021 11:44 AM

## 2021-11-24 NOTE — Group Note (Signed)
LCSW Group Therapy Note   Group Date: 11/24/2021 Start Time: 1100 End Time: 1200  Type of Therapy and Topic:  Group Therapy:  Stress Management   Participation Level:  Minimal    Description of Group:  Patients in this group were introduced to the idea of stress and encouraged to discuss negative and positive ways to manage stress. Patients discussed specific stressors that they have in their life right now and the physical signs and symptoms associated with that stress.  Patient encouraged to come up with positive changes to assist with the stress upon discharge in order to prevent future hospitalizations.   They also worked as a group on developing a specific plan for several patients to deal with stressors through boundary-setting, psychoeducation and self care techniques   Therapeutic Goals:               1)  To discuss the positive and negative impacts of stress             2)  identify signs and symptoms of stress             3)  generate ideas for stress management             4)  offer mutual support to others regarding stress management             5)  Developing plans for ways to manage specific stressors upon discharge               Summary of Patient Progress:  Pt attended group and shared he was stressed due to losing his notebook, however was able to ask for help and located it later.   Therapeutic Modalities:   Motivational Interviewing Brief Solution-Focused Therapy

## 2021-11-24 NOTE — Progress Notes (Signed)
Pt respond to internal stimuli, tonight appears to be worse than the past few days. Pt talking to voices more frequently than the past few days

## 2021-11-24 NOTE — Progress Notes (Signed)
°   11/24/21 1200  Psych Admission Type (Psych Patients Only)  Admission Status Involuntary  Psychosocial Assessment  Patient Complaints None  Eye Contact Brief  Facial Expression Sad  Affect Anxious  Speech Tangential  Interaction Assertive  Motor Activity Restless  Appearance/Hygiene Disheveled  Behavior Characteristics Cooperative  Mood Preoccupied  Thought Process  Coherency Circumstantial;Disorganized  Content Blaming self  Delusions Paranoid  Perception Hallucinations  Hallucination Auditory;Visual  Judgment Impaired  Confusion Mild  Danger to Self  Current suicidal ideation? Denies  Danger to Others  Danger to Others None reported or observed

## 2021-11-25 DIAGNOSIS — F29 Unspecified psychosis not due to a substance or known physiological condition: Secondary | ICD-10-CM | POA: Diagnosis not present

## 2021-11-25 MED ORDER — NICOTINE 21 MG/24HR TD PT24
21.0000 mg | MEDICATED_PATCH | Freq: Every day | TRANSDERMAL | Status: DC
  Administered 2021-11-26 – 2021-12-03 (×8): 21 mg via TRANSDERMAL
  Filled 2021-11-25 (×10): qty 1

## 2021-11-25 NOTE — [facility] Group Notes (Signed)
Adult Psychoeducational Group Note  Date:  11/25/2021 Time:  11:20 PM  Group Topic/Focus:  Goals Group:   The focus of this group is to help patients establish daily goals to achieve during treatment and discuss how the patient can incorporate goal setting into their daily lives to aide in recovery.  Participation Level:  Active  Participation Quality:  Attentive  Affect:  Appropriate  Cognitive:  Appropriate  Insight: Good  Engagement in Group:  Engaged  Modes of Intervention:  Discussion  Additional Comments  Jacalyn Lefevre 11/25/2021, 11:20 PM

## 2021-11-25 NOTE — Progress Notes (Signed)
°   11/25/21 0545  Sleep  Number of Hours 8

## 2021-11-25 NOTE — Progress Notes (Addendum)
Patient has been up and active on the unit, attended group this evening and has voiced no complaints. Patient currently denies having pain, -si/hi/a/v hall. Support and encouragement offered, safety maintained on unit, will continue to monitor.  

## 2021-11-25 NOTE — Group Note (Signed)
LCSW Group Therapy Note 11/25/2021  11:15am-12:00pm  Type of Therapy and Topic:  Group Therapy: Anger and Commonalities  Participation Level:  Minimal   Description of Group: In this group, patients initially shared an "unknown" fact about themselves and CSW led a discussion about the ways in which we have things in common without realizing it.  Patient then identified a recent time they became angry and how this yet again showed a way in which they had something in common with other patients.  We discussed possible unhealthy reactions to anger and possible healthy reactions.  We also discussed possible underlying emotions that lead to the anger.  Commonalities among group members were pointed out throughout the entirety of group.  Therapeutic Goals: Patients were asked to share something about themselves and learned that they often have things in common with other people without knowing this Patients will remember an incident of anger and how they reacted Patients will be able to identify their reaction as healthy or unhealthy, and identify possible reactions that would have been the opposite Patients will learn that anger itself is a secondary emotion and will think about their primary emotion at the time of their last incident of anger  Summary of Patient Progress:  The patient shared that something interesting he could share about himself is that inside himself he is truly a lovely person.  This was expressed as also present in several other patients, so the patient was able to recognize that he is not alone.  The patient also said a frequent cause of anger is being lied to by people saying they are his friend but then backstab him.  He said he deals with this by "leaving them on the back page" and also stated he needs medicine for his anger.  He responded to questioning that he does feel his current medicine is working.Marland Kitchen  His conclusion by the end of group was that this chosen method of coping  is very healthy.  Therapeutic Modalities:   Cognitive Behavioral Therapy  Lynnell Chad, LCSW 11/25/2021  2:51 PM

## 2021-11-25 NOTE — Progress Notes (Signed)
[facility] MD Progress Note  11/25/2021 1:42 PM Jeffrey Holland  MRN:  [id number] Subjective:   Jeffrey Holland is a 36 y.o. male with no known past psychiatric history, who was initially admitted for inpatient psychiatric hospitalization on 11/15/2021 for management of hallucinations and delusions.     Case was discussed in the multidisciplinary team. MAR was reviewed and patient was compliant with medications. He did not require PRN medications for agitation.     Psychiatric Team made the following recommendations yesterday:  -Discontinue Zyprexa -Continue Haldol to 8m daily and 15 mg nightly for residual hallucinations - Continue Remeron 7.566mfor depressed mood -  Continue Cogentin 0.4m73mid PRN  - Increase Lithium to 450m42mD             - Lithium lvl 1/15 - Agitation Protocol: Haldol 4mg 7mor po q8h PRN, Ativan 1mg p57mr 2mg IM18m8 hours PRN, Benadryl 50mg IM24mpo q8 hours PRN  Patient reports that he is sleeping much better with the addition of Lithium. Patient reports that he is no longer seeing "something" that looks like his brother. Patient reports that he is hearing the voices of his nephew, his parents, and his boss in his head. Patient reports that he still feels he has psychic powers and that he is able to have conversations with these people in his mind. Patient reports during assessment that the voices are telling him " You're not stupid, you are talking to use." Patient denies SI and HI. Patient reports that he is still a bit sad about being in the hospital, but his mood overall does feel better than on presentation. Patient reports that he has also found a place to keep his folder, because he is aware that he was not keeping up with it well.   Principal Problem: Schizophrenia spectrum disorder with psychotic disorder type not yet determined (HCC) [place]ia[place]: Principal Problem:   Schizophrenia spectrum disorder with psychotic disorder type not yet determined (HCC) [place]  Problems:   Alcohol abuse   Cannabis abuse   Cocaine abuse, episodic use (HCC)   [place]an[place] C  Total Time spent with patient: 15 minutes  Past Psychiatric History: See H&P  Past Medical History:  Past Medical History:  Diagnosis Date   Anxiety    Asthma    Panic    History reviewed. No pertinent surgical history. Family History: History reviewed. No pertinent family history. Family Psychiatric  History: See H&P Social History:  Social History   Substance and Sexual Activity  Alcohol Use Yes     Social History   Substance and Sexual Activity  Drug Use Yes   Types: Marijuana, Methamphetamines, Cocaine    Social History   Socioeconomic History   Marital status: Single    Spouse name: Not on file   Number of children: Not on file   Years of education: Not on file   Highest education level: Not on file  Occupational History   Not on file  Tobacco Use   Smoking status: Every Day    Packs/day: 1.00    Types: Cigarettes   Smokeless tobacco: Not on file  Vaping Use   Vaping Use: Every day  Substance and Sexual Activity   Alcohol use: Yes   Drug use: Yes    Types: Marijuana, Methamphetamines, Cocaine   Sexual activity: Yes    Birth control/protection: None  Other Topics Concern   Not on file  Social History Narrative   ** Merged  History Encounter **       Social Determinants of Radio broadcast assistant Strain: Not on file  Food Insecurity: Not on file  Transportation Needs: Not on file  Physical Activity: Not on file  Stress: Not on file  Social Connections: Not on file   Additional Social History:                         Sleep: Good  Appetite:  Good  Current Medications: Current Facility-Administered Medications  Medication Dose Route Frequency Provider Last Rate Last Admin   acetaminophen (TYLENOL) tablet 650 mg  650 mg Oral Q6H PRN Margorie John W, PA-C   650 mg at 11/23/21 1330   albuterol (VENTOLIN HFA) 108 (90 Base) MCG/ACT  inhaler 2 puff  2 puff Inhalation Q6H PRN Harlow Asa, MD       alum & mag hydroxide-simeth (MAALOX/MYLANTA) 200-200-20 MG/5ML suspension 30 mL  30 mL Oral Q4H PRN Lovena Le, [name], PA-C       benztropine (COGENTIN) tablet 0.5 mg  0.5 mg Oral BID PRN Nelda Marseille, Amy E, MD       diphenhydrAMINE (BENADRYL) capsule 50 mg  50 mg Oral Q8H PRN Margorie John W, PA-C   50 mg at 11/20/21 2223   Or   diphenhydrAMINE (BENADRYL) injection 50 mg  50 mg Intramuscular Q8H PRN Margorie John W, PA-C   50 mg at 11/19/21 1255   famotidine (PEPCID) tablet 20 mg  20 mg Oral BID Damita Dunnings B, MD   20 mg at 11/25/21 0819   feeding supplement (ENSURE ENLIVE / ENSURE PLUS) liquid 237 mL  237 mL Oral BID BM Iley Breeden, Joyce Gross B, MD   237 mL at 11/24/21 1326   haloperidol (HALDOL) tablet 15 mg  15 mg Oral Q2000 Damita Dunnings B, MD   15 mg at 11/24/21 2046   haloperidol (HALDOL) tablet 5 mg  5 mg Oral Q8H PRN Margorie John W, PA-C   5 mg at 11/18/21 2311   Or   haloperidol lactate (HALDOL) injection 5 mg  5 mg Intramuscular Q8H PRN Margorie John W, PA-C   5 mg at 11/19/21 1255   haloperidol (HALDOL) tablet 5 mg  5 mg Oral Q breakfast Damita Dunnings B, MD   5 mg at 11/25/21 0818   lithium carbonate capsule 450 mg  450 mg Oral BID WC Damita Dunnings B, MD   450 mg at 11/25/21 0819   LORazepam (ATIVAN) tablet 1 mg  1 mg Oral Q8H PRN Freida Busman, MD       Or   LORazepam (ATIVAN) injection 2 mg  2 mg Intramuscular Q8H PRN Damita Dunnings B, MD       magnesium hydroxide (MILK OF MAGNESIA) suspension 30 mL  30 mL Oral Daily PRN Margorie John W, PA-C       mirtazapine (REMERON) tablet 7.5 mg  7.5 mg Oral QHS Damita Dunnings B, MD   7.5 mg at 11/24/21 2046   multivitamin with minerals tablet 1 tablet  1 tablet Oral Daily Harlow Asa, MD   1 tablet at 11/25/21 0818   nicotine polacrilex (NICORETTE) gum 2 mg  2 mg Oral TID Damita Dunnings B, MD   2 mg at 11/25/21 0819   thiamine tablet 100 mg  100 mg Oral Daily Viann Fish E, MD   100  mg at 11/25/21 0819   traZODone (DESYREL) tablet 100 mg  100 mg Oral QHS  PRN Harlow Asa, MD   100 mg at 11/24/21 2046    Lab Results: No results found for this or any previous visit (from the past 48 hour(s)).  Blood Alcohol level:  Lab Results  Component Value Date   ETH <10 11/14/2021   ETH <10 62/83/6629    Metabolic Disorder Labs: Lab Results  Component Value Date   HGBA1C 5.5 11/15/2021   MPG 111.15 11/15/2021   No results found for: PROLACTIN Lab Results  Component Value Date   CHOL 151 11/15/2021   TRIG 98 11/15/2021   HDL 42 11/15/2021   CHOLHDL 3.6 11/15/2021   VLDL 20 11/15/2021   LDLCALC 89 11/15/2021    Physical Findings: AIMS: Facial and Oral Movements Muscles of Facial Expression: None, normal Lips and Perioral Area: None, normal Jaw: None, normal Tongue: None, normal,Extremity Movements Upper (arms, wrists, hands, fingers): None, normal Lower (legs, knees, ankles, toes): None, normal, Trunk Movements Neck, shoulders, hips: None, normal, Overall Severity Severity of abnormal movements (highest score from questions above): None, normal Incapacitation due to abnormal movements: None, normal Patient's awareness of abnormal movements (rate only patient's report): No Awareness, Dental Status Current problems with teeth and/or dentures?: No Does patient usually wear dentures?: No  CIWA:  CIWA-Ar Total: 1 COWS:     Musculoskeletal: Strength & Muscle Tone: within normal limits Gait & Station: normal Patient leans: N/A  Psychiatric Specialty Exam:  Presentation  General Appearance: Appropriate for Environment  Eye Contact:Good  Speech:Clear and Coherent  Speech Volume:Normal  Handedness:Right   Mood and Affect  Mood:-- ("a litte better")  Affect:Congruent   Thought Process  Thought Processes:Goal Directed  Descriptions of Associations:Circumstantial  Orientation:Full (Time, Place and Person)  Thought  Content:Illogical  History of Schizophrenia/Schizoaffective disorder:No  Duration of Psychotic Symptoms:Less than six months  Hallucinations:Hallucinations: Auditory  Ideas of Reference:Paranoia  Suicidal Thoughts:Suicidal Thoughts: No  Homicidal Thoughts:Homicidal Thoughts: No   Sensorium  Memory:Immediate Fair  Judgment:-- (Improving)  Insight:Shallow   Executive Functions  Concentration:Fair  Attention Span:Fair  [place]   Psychomotor Activity  Psychomotor Activity:Psychomotor Activity: Normal   Assets  Assets:Resilience; Social Support; Armed forces logistics/support/administrative officer; Housing   Sleep  Sleep:Sleep: Good    Physical Exam: Physical Exam Constitutional:      Appearance: Normal appearance.  HENT:     Head: Normocephalic and atraumatic.  Pulmonary:     Effort: Pulmonary effort is normal.  Neurological:     Mental Status: He is alert.   Review of Systems  Psychiatric/Behavioral:  Positive for hallucinations. Negative for suicidal ideas. The patient does not have insomnia.   Blood pressure 106/85, pulse (!) 101, temperature 97.6 F (36.4 C), temperature source Oral, resp. rate 16, height 5' 6"  (1.676 m), weight 62.6 kg, SpO2 98 %. Body mass index is 22.27 kg/m.   Treatment Plan Summary: Daily contact with patient to assess and evaluate symptoms and progress in treatment and Medication management Norrin Shreffler is a 36 yo patient w/ no known PPH who presented endorsing AVH, delusions, and depressed mood.  Dushawn is improving with Lithium. He is reporting no AH and decreasing VH, but he still delusional due to the [facility] he is having. He still as some paranoid questions about discharge and does not seem convinced that he will be discharged one day, but he is less preoccupied with this and is less emotionally labile during conversations. Patient appears more comfortable leaving his room and interacting with other patients.    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 Psychiatric Diagnoses and Treatment:              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 (r/o Substance induced psychosis vs MDD, recurrent, severe w/ psychotic features vs Schizophreniform disorder vs Bipolar disorder, current episode depressed w/ psychotic features vs psychosis secondary to general medical condition) -Continue Haldol to 60m daily and 15 mg nightly for residual hallucinations - Continue Remeron 7.58mfor depressed mood -  Continue Cogentin 0.60m37mid PRN  - Continue Lithium to 450m49mD             - Lithium lvl 1/15 - Agitation Protocol: Haldol 60mg 57mor po q8h PRN, Ativan 1mg p360mr 2mg IM660m8 hours PRN, Benadryl 50mg IM2mpo q8 hours PRN -- New onset psychosis w/u: Negative noncontrast head CT on 11/13/2021, Hep C Ab positive; ESR 2, ceruloplasmin 20.5, HIV nonreactive, B12 348, PR nonreactive, ANA negative, TSH 0.667, UDS positive for THC, alcohol less than 10, BMP within normal limits other than a glucose of 123, white count 13.1 trending down to 10.2, AST elevated at 62 and ALT elevated at 103, B1 125.3- Labs Pending:  Heavy metals --Antipsychotic lab monitoring: EKG: QTC- 432 and on repeat QTC 403ms, A196m5, Lipids WNL             -- Continue Trazodone 100mg qhs 76minsomnia and  improved daytime sleep hygiene encouraged             -- Encouraged patient to participate in unit milieu and in scheduled group therapies                EtoH use (r/o alcohol use d/o) Cocaine use - episodic (r/o stimulant use d/o) 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 Medical Issues Being Addressed:              Tobacco Use Disorder             -- Nicotine patch 21mg/24 ho74mordered, scheduled             -- Smoking cessation encouraged               Hx of Gastric Ulcers -- Continue home Pepcid 20mg BID   10mC ab positive/Transaminitis -- Hep C RNA Quant lab-353,000 - Will  need f/u OP -- AST trending up to 62> 94 and ALT up to 103> 109 (has been at AST 155 and ALT 228 2 years ago) with T bili 1.4 > 0.9 and indirect bili 1.3 > 0.8 - Previously Consulted GI, Dr. Paulita Fujita, recommends RUQ [place] and if this is negative patient can be seen OP for Hep C mgmt, and his dx is likely Chronic Hep C.  - RUQ [place] * please see reading, recommend patient f/u OP as there is nothing acutely concerning, SW to schedule PCP   Leukocytosis - resolved -- Repeat CBC shows WBC 10.2 on 11/17/21   4. Discharge Planning:              -- Social work and case management to assist with discharge planning and identification of hospital follow-up needs prior to discharge             -- Discharge Concerns: Need to establish a safety plan; Medication compliance and effectiveness             -- Discharge Goals: Return home with outpatient referrals for mental health follow-up including medication management/psychotherapy    PGY-2 Freida Busman, MD 11/25/2021, 1:42 PM

## 2021-11-26 DIAGNOSIS — F29 Unspecified psychosis not due to a substance or known physiological condition: Secondary | ICD-10-CM | POA: Diagnosis not present

## 2021-11-26 LAB — LITHIUM LEVEL: Lithium Lvl: 0.3 mmol/L — ABNORMAL LOW (ref 0.60–1.20)

## 2021-11-26 MED ORDER — HALOPERIDOL 5 MG PO TABS
7.5000 mg | ORAL_TABLET | Freq: Every day | ORAL | Status: DC
  Administered 2021-11-27 – 2021-11-29 (×3): 7.5 mg via ORAL
  Filled 2021-11-26 (×4): qty 1.5

## 2021-11-26 MED ORDER — HALOPERIDOL 5 MG PO TABS
2.5000 mg | ORAL_TABLET | Freq: Once | ORAL | Status: AC
  Administered 2021-11-26: 2.5 mg via ORAL
  Filled 2021-11-26: qty 0.5

## 2021-11-26 NOTE — Progress Notes (Signed)
Pt continues to respond on the unit at times, pt keeps to himself a lot on the unit    11/26/21 2200  Psych Admission Type (Psych Patients Only)  Admission Status Involuntary  Psychosocial Assessment  Patient Complaints Worrying  Eye Contact Brief  Facial Expression Sad  Affect Appropriate to circumstance  Speech Logical/coherent  Interaction Isolative  Motor Activity Slow  Appearance/Hygiene Disheveled  Behavior Characteristics Cooperative  Mood Preoccupied;Suspicious  Aggressive Behavior  Effect No apparent injury  Thought Process  Coherency Circumstantial;Disorganized  Content Blaming self  Delusions Paranoid  Perception Hallucinations  Hallucination Auditory  Judgment Impaired  Confusion Mild  Danger to Self  Current suicidal ideation? Denies  Danger to Others  Danger to Others None reported or observed

## 2021-11-26 NOTE — Progress Notes (Addendum)
[facility] MD Progress Note  11/26/2021 11:15 AM Jeffrey Holland  MRN:  [id number] Subjective:  Jeffrey Holland is a 36 y.o. male with no known past psychiatric history, who was initially admitted for inpatient psychiatric hospitalization on 11/15/2021 for management of hallucinations and delusions.     Case was discussed in the multidisciplinary team. MAR was reviewed and patient was compliant with medications. He did not require PRN medications for agitation.     Psychiatric Team made the following recommendations yesterday:  -Continue Haldol to 32m daily and 15 mg nightly for residual hallucinations - Continue Remeron 7.51mfor depressed mood -  Continue Cogentin 0.2m29mid PRN  - Continue Lithium to 450m72mD             - Lithium lvl 1/15 - Agitation Protocol: Haldol 2mg 59mor po q8h PRN, Ativan 1mg p2mr 2mg IM88m8 hours PRN, Benadryl 50mg IM60mpo q8 hours PRN -- New onset psychosis w/u: Negative noncontrast head CT on 11/13/2021, Hep C Ab positive; ESR 2, ceruloplasmin 20.5, HIV nonreactive, B12 348, PR nonreactive, ANA negative, TSH 0.667, UDS positive for THC, alcohol less than 10, BMP within normal limits other than a glucose of 123, white count 13.1 trending down to 10.2, AST elevated at 62 and ALT elevated at 103, B1 125.3-   Staff reports patient is less isolative and less restless. SW noticed that patient has been writing a very long letter on the side of one of his large paper bags (after tearing the side off.)   On assessment today patient reports that he has been having nightmares at night that he describes as odd dreams. Patient reports that he overall is feeling better than a few days ago and happy is able to get some sleep, but he describes his nightmares as vivid. Patient shows the large letter he has been writing on the torn side of a brown bag. In the letter patient mentions praying to "Gouza." [place] reports that since being in the hospital he saw a pyramid in a dream and heard  "[place]" while in the dream. Patient reports that since then he has begun believing he needs to pray to Gouza. [place] patient reports that he is feeling better during the day and is eating well. Patient denies VH but reports he is still having AH. Patient reports he is hearing the voices of his family and his boss talk to him. Patient reports that he is sure they are talking to him and not memories. Patient reports that he also feels he is telepathic. Patient reports that he feels he can communicate with his family and read the minds of other patient's if he chooses.  Patient reports that he does not feel he is having adverse side effects from his medication. Principal Problem: Schizophrenia spectrum disorder with psychotic disorder type not yet determined (HCC) Dia[place] Parksis: Principal Problem:   Schizophrenia spectrum disorder with psychotic disorder type not yet determined (HCC) [place] Problems:   Alcohol abuse   Cannabis abuse   Cocaine abuse, episodic use (HCC)   [place]  Total Time spent with patient: 15 minutes  Past Psychiatric History: See H&P  Past Medical History:  Past Medical History:  Diagnosis Date   Anxiety    Asthma    Panic    History reviewed. No pertinent surgical history. Family History: History reviewed. No pertinent family history. Family Psychiatric  History: See H&P Social History:  Social History   Substance and Sexual Activity  Alcohol Use Yes     Social History   Substance and Sexual Activity  Drug Use Yes   Types: Marijuana, Methamphetamines, Cocaine    Social History   Socioeconomic History   Marital status: Single    Spouse name: Not on file   Number of children: Not on file   Years of education: Not on file   Highest education level: Not on file  Occupational History   Not on file  Tobacco Use   Smoking status: Every Day    Packs/day: 1.00    Types: Cigarettes   Smokeless tobacco: Not on file  Vaping Use   Vaping Use: Every day   Substance and Sexual Activity   Alcohol use: Yes   Drug use: Yes    Types: Marijuana, Methamphetamines, Cocaine   Sexual activity: Yes    Birth control/protection: None  Other Topics Concern   Not on file  Social History Narrative   ** Merged History Encounter **       Social Determinants of Health   Financial Resource Strain: Not on file  Food Insecurity: Not on file  Transportation Needs: Not on file  Physical Activity: Not on file  Stress: Not on file  Social Connections: Not on file   Additional Social History:                         Sleep: Fair  Appetite:  Good  Current Medications: Current Facility-Administered Medications  Medication Dose Route Frequency Provider Last Rate Last Admin   acetaminophen (TYLENOL) tablet 650 mg  650 mg Oral Q6H PRN Prescilla Sours, PA-C   650 mg at 11/23/21 1330   albuterol (VENTOLIN HFA) 108 (90 Base) MCG/ACT inhaler 2 puff  2 puff Inhalation Q6H PRN Harlow Asa, MD       alum & mag hydroxide-simeth (MAALOX/MYLANTA) 200-200-20 MG/5ML suspension 30 mL  30 mL Oral Q4H PRN Lovena Le, [name], PA-C       benztropine (COGENTIN) tablet 0.5 mg  0.5 mg Oral BID PRN Nelda Marseille, Amy E, MD       diphenhydrAMINE (BENADRYL) capsule 50 mg  50 mg Oral Q8H PRN Margorie John W, PA-C   50 mg at 11/20/21 2223   Or   diphenhydrAMINE (BENADRYL) injection 50 mg  50 mg Intramuscular Q8H PRN Margorie John W, PA-C   50 mg at 11/19/21 1255   famotidine (PEPCID) tablet 20 mg  20 mg Oral BID Damita Dunnings B, MD   20 mg at 11/26/21 0830   feeding supplement (ENSURE ENLIVE / ENSURE PLUS) liquid 237 mL  237 mL Oral BID BM Jahn Franchini B, MD   237 mL at 11/25/21 1415   haloperidol (HALDOL) tablet 15 mg  15 mg Oral Q2000 Kanyon Bunn, Joyce Gross B, MD   15 mg at 11/25/21 2031   haloperidol (HALDOL) tablet 2.5 mg  2.5 mg Oral Once Damita Dunnings B, MD       haloperidol (HALDOL) tablet 5 mg  5 mg Oral Q8H PRN Margorie John W, PA-C   5 mg at 11/18/21 2311   Or   haloperidol  lactate (HALDOL) injection 5 mg  5 mg Intramuscular Q8H PRN Margorie John W, PA-C   5 mg at 11/19/21 1255   [START ON 11/27/2021] haloperidol (HALDOL) tablet 7.5 mg  7.5 mg Oral Q breakfast Damita Dunnings B, MD       lithium carbonate capsule 450 mg  450 mg Oral BID WC Miley Lindon,  Mila Palmer, MD   450 mg at 11/26/21 0831   LORazepam (ATIVAN) tablet 1 mg  1 mg Oral Q8H PRN Freida Busman, MD       Or   LORazepam (ATIVAN) injection 2 mg  2 mg Intramuscular Q8H PRN Damita Dunnings B, MD       magnesium hydroxide (MILK OF MAGNESIA) suspension 30 mL  30 mL Oral Daily PRN Margorie John W, PA-C       mirtazapine (REMERON) tablet 7.5 mg  7.5 mg Oral QHS Damita Dunnings B, MD   7.5 mg at 11/25/21 2031   multivitamin with minerals tablet 1 tablet  1 tablet Oral Daily Harlow Asa, MD   1 tablet at 11/26/21 0830   nicotine (NICODERM CQ - dosed in mg/24 hours) patch 21 mg  21 mg Transdermal Daily Hill, Jackie Plum, MD   21 mg at 11/26/21 0830   thiamine tablet 100 mg  100 mg Oral Daily Viann Fish E, MD   100 mg at 11/26/21 0830    Lab Results:  Results for orders placed or performed during the hospital encounter of 11/15/21 (from the past 48 hour(s))  Lithium level     Status: Abnormal   Collection Time: 11/26/21  6:24 AM  Result Value Ref Range   Lithium Lvl 0.30 (L) 0.60 - 1.20 mmol/L    Comment: Performed at [facility], [place] [address], [place], [place] [address]    Blood Alcohol level:  Lab Results  Component Value Date   ETH <10 11/14/2021   ETH <10 72/53/6644    Metabolic Disorder Labs: Lab Results  Component Value Date   HGBA1C 5.5 11/15/2021   MPG 111.15 11/15/2021   No results found for: PROLACTIN Lab Results  Component Value Date   CHOL 151 11/15/2021   TRIG 98 11/15/2021   HDL 42 11/15/2021   CHOLHDL 3.6 11/15/2021   VLDL 20 11/15/2021   LDLCALC 89 11/15/2021    Physical Findings: AIMS: Facial and Oral Movements Muscles of Facial Expression: None,  normal Lips and Perioral Area: None, normal Jaw: None, normal Tongue: None, normal,Extremity Movements Upper (arms, wrists, hands, fingers): None, normal Lower (legs, knees, ankles, toes): None, normal, Trunk Movements Neck, shoulders, hips: None, normal, Overall Severity Severity of abnormal movements (highest score from questions above): None, normal Incapacitation due to abnormal movements: None, normal Patient's awareness of abnormal movements (rate only patient's report): No Awareness, Dental Status Current problems with teeth and/or dentures?: No Does patient usually wear dentures?: No  CIWA:  CIWA-Ar Total: 0 COWS:     Musculoskeletal: Strength & Muscle Tone: within normal limits Gait & Station: normal Patient leans: N/A  Psychiatric Specialty Exam:  Presentation  General Appearance: Appropriate for Environment  Eye Contact:Good  Speech:Clear and Coherent  Speech Volume:Normal  Handedness:Right   Mood and Affect  Mood:-- ("a litte better")  Affect:Congruent   Thought Process  Thought Processes:Goal Directed  Descriptions of Associations:Circumstantial  Orientation:Full (Time, Place and Person)  Thought Content:Illogical  History of Schizophrenia/Schizoaffective disorder:No  Duration of Psychotic Symptoms:Less than six months  Hallucinations:Hallucinations: Auditory  Ideas of Reference:Paranoia  Suicidal Thoughts:Suicidal Thoughts: No  Homicidal Thoughts:Homicidal Thoughts: No   Sensorium  Memory:Immediate Fair  Judgment:-- (Improving)  Insight:Shallow   Executive Functions  Concentration:Fair  Attention Span:Fair  [place]   Psychomotor Activity  Psychomotor Activity:Psychomotor Activity: Normal   Assets  Assets:Resilience; Social Support; Armed forces logistics/support/administrative officer; Housing   Sleep  Sleep:Sleep: Good  Physical Exam: Physical Exam Constitutional:      Appearance: Normal  appearance.  HENT:     Head: Normocephalic and atraumatic.  Pulmonary:     Effort: Pulmonary effort is normal.  Neurological:     Mental Status: He is alert and oriented to person, place, and time.   Review of Systems  Psychiatric/Behavioral:  Positive for hallucinations. Negative for suicidal ideas. The patient does not have insomnia.   Blood pressure 119/73, pulse 70, temperature 97.7 F (36.5 C), temperature source Oral, resp. rate 16, height 5' 6"  (1.676 m), weight 62.6 kg, SpO2 99 %. Body mass index is 22.27 kg/m.   Treatment Plan Summary: Daily contact with patient to assess and evaluate symptoms and progress in treatment and Medication management Izaiyah Kleinman is a 36 yo patient w/ no known PPH who presented endorsing AVH, delusions, and depressed mood. Leonor Liv level is subtherapeutic, but he is improving and has only been on 469m BID for less than 48 hrs. Patient continues to endorse psychotic symptoms although they appear less obvious than on admission. Patient would benefit from an increase in his Haldol. Patient's nightmares also appear to contribute to his bizarre thinking and this may be a result of his PRN trazodone. Will stop this and watch patient's rest and response.  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 Psychiatric Diagnoses and Treatment:              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 (r/o Substance induced psychosis vs MDD, recurrent, severe w/ psychotic features vs Schizophreniform disorder vs Bipolar disorder, current episode depressed w/ psychotic features vs psychosis secondary to general medical condition) -Increase Haldol to 7.5103mdaily and 15 mg nightly for residual hallucinations - Discontinue PRN Trazodone - Continue Remeron 7.53m53mor depressed mood -  Continue Cogentin 0.53mg19md PRN  - Continue Lithium to 450mg22m             - Lithium lvl 1/15: 0.30 - Agitation Protocol: Haldol 53mg I71mr po q8h PRN, Ativan 1mg po60m 2mg IM 88m hours PRN, Benadryl 50mg IM 60mo q8 hours PRN -- New onset psychosis w/u: Negative noncontrast head CT on 11/13/2021, Hep C Ab positive; ESR 2, ceruloplasmin 20.5, HIV nonreactive, B12 348, PR nonreactive, ANA negative, TSH 0.667, UDS positive for THC, alcohol less than 10, BMP within normal limits other than a glucose of 123, white count 13.1 trending down to 10.2, AST elevated at 62 and ALT elevated at 103, B1 125.3- Labs  Pending:  Heavy metals --Antipsychotic lab monitoring: EKG: QTC- 432 and on repeat QTC 48m, A1c 5.5, Lipids WNL             -- Continue Trazodone 1053mqhs PRN insomnia and improved daytime sleep hygiene encouraged             -- Encouraged patient to participate in unit milieu and in scheduled group therapies                EtoH use (r/o alcohol use d/o) Cocaine use - episodic (r/o stimulant use d/o) 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 Medical Issues Being Addressed:              Tobacco Use Disorder             -- Nicotine patch 2171m4 hours ordered, scheduled             -- Smoking cessation encouraged               Hx of Gastric Ulcers -- Continue home  Pepcid 37m19mD   Hep C ab positive/Transaminitis -- Hep C RNA Quant lab-353,000 - Will need f/u OP -- AST trending up to 62> 94 and ALT up to 103> 109 (has been at AST 155 and ALT 228 2 years ago) with T bili 1.4 > 0.9 and indirect bili 1.3 > 0.8 - Previously Consulted GI, Dr. 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  - RUQ US *[place] see reading, recommend patient f/u OP as there is nothing acutely concerning, SW to schedule PCP   Leukocytosis - resolved -- Repeat CBC shows WBC 10.2 on 11/17/21   4. Discharge Planning:              -- Social work and case management to assist with discharge planning and identification of hospital follow-up needs prior to discharge             -- Discharge Concerns: Need to establish a safety plan; Medication compliance and effectiveness             -- Discharge Goals: Return home with outpatient referrals for mental health follow-up including medication management/psychotherapy       PGY-2 Abdo Denault Freida Busman 11/26/2021, 11:15 AM

## 2021-11-26 NOTE — [facility] Group Notes (Signed)
Pt did not attend orientation group.  

## 2021-11-26 NOTE — Group Note (Signed)
[facility] LCSW Group Therapy Note  11/26/2021  10:00-11:00AM  Type of Therapy and Topic:  Group Therapy:  Adding Supports Including Being Your Own Support  Participation Level:  Did Not Attend   Description of Group:  Patients in this group were introduced to the concept that additional supports including self-support are an essential part of recovery.  After a discussion about the differences between healthy supports and unheathy supports, a song entitled "My Own Hero" was played.  A group discussion ensued in which patients stated they could relate to the song and it inspired them to realize they have be willing to help themselves in order to succeed, because other people cannot achieve sobriety or stability for them.  We discussed adding a variety of healthy supports to address the various needs in our lives.  We also talked about how to put up necessary boundaries to limit unhealthy supports from harming [place].  Therapeutic Goals: 1)  demonstrate the importance of being a part of one's own support system 2)  discuss reasons people in one's life may eventually be unable to be continually supportive  3)  identify the patient's current support system and   4)  elicit commitments to add healthy supports and to become more conscious of being self-supportive   Summary of Patient Progress:  The patient was invited to group, did not attend  Therapeutic Modalities:   Motivational Interviewing Activity  Lynnell Chad

## 2021-11-26 NOTE — [facility] Group Notes (Addendum)
Group Topic/Focus:  Goals Group:   The focus of this group is to help patients establish daily goals to achieve during treatment and discuss how the patient can incorporate goal setting into their daily lives to aide in recovery. Orientation:   The focus of this group is to educate the patient on the purpose and policies of crisis stabilization and provide a format to answer questions about their admission.  The group details unit policies and expectations of patients while admitted.  Participation Level:  Minimal  Participation Quality:  Appropriate  Affect:  Flat  Cognitive:  Appropriate  Insight: Appropriate  Engagement in Group:  Limited  Modes of Intervention:  Education, Orientation, Socialization, and Support  Additional Comments:  Pt attended goals group and was minimal in participation. Pt's goal for today is "getting myself back in order."

## 2021-11-26 NOTE — [facility] Group Notes (Signed)
Pt did not attend leisure education group. °

## 2021-11-26 NOTE — Progress Notes (Signed)
Patient came to group and then left after he made his coffee. The patient had been talking to himself upon entering the dayroom.

## 2021-11-27 DIAGNOSIS — F29 Unspecified psychosis not due to a substance or known physiological condition: Secondary | ICD-10-CM | POA: Diagnosis not present

## 2021-11-27 MED ORDER — QUETIAPINE FUMARATE 25 MG PO TABS
25.0000 mg | ORAL_TABLET | Freq: Once | ORAL | Status: AC
  Administered 2021-11-27: 25 mg via ORAL
  Filled 2021-11-27 (×2): qty 1

## 2021-11-27 MED ORDER — QUETIAPINE FUMARATE 25 MG PO TABS
25.0000 mg | ORAL_TABLET | Freq: Every day | ORAL | Status: DC
  Administered 2021-11-27: 25 mg via ORAL
  Filled 2021-11-27 (×2): qty 1

## 2021-11-27 MED ORDER — QUETIAPINE FUMARATE 25 MG PO TABS
25.0000 mg | ORAL_TABLET | Freq: Every day | ORAL | Status: DC | PRN
  Administered 2021-11-27 – 2021-11-28 (×2): 25 mg via ORAL
  Filled 2021-11-27 (×2): qty 1

## 2021-11-27 NOTE — Group Note (Signed)
Recreation Therapy Group Note   Group Topic:Stress Management  Group Date: 11/27/2021 Start Time: 0930 End Time: 0950 Facilitators: Victorino Sparrow, [place] Location: [address] Hall Dayroom   Goal Area(s) Addresses:  Patient will identify positive stress management techniques. Patient will identify benefits of using stress management post d/c.  Group Description:  Meditation.  LRT played a meditation that focused on effortlessness.  The meditation was encouraging patients to take the time do nothing and embrace just being.  Patients were to listen and follow along as meditation played to fully engage in activity.   Affect/Mood: Appropriate   Participation Level: Active   Participation Quality: Independent   Behavior: Appropriate   Speech/Thought Process: Focused   Insight: Good   Judgement: Good   Modes of Intervention: Meditation   Patient Response to Interventions:  Engaged   Education Outcome:  Acknowledges education and In group clarification offered    Clinical Observations/Individualized Feedback: Pt attended and participated in group.    Plan: Continue to engage patient in RT group sessions 2-3x/week.   Victorino Sparrow, LRT,CTRS 11/27/2021 11:31 AM

## 2021-11-27 NOTE — Group Note (Signed)
LCSW Group Therapy Note ° ° °Group Date: 11/27/2021 °Start Time: 1300 °End Time: 1400 ° °Type of Therapy and Topic:  Group Therapy:  Self-Esteem °  °Participation Level:  Active ° °Description of Group: °This group addressed positive self-esteem. Patients were given a worksheet with a blank shield. Patients were asked what a shield is and when it is used. Patients were asked to list, draw, or write protective factors in the their lives on their shields. Patients discussed the words, ideas and drawings that they put on their shield. Patients were encouraged to have a daily reflection of positive characteristics/ protective factors. ° °Therapeutic Goals °Patient will verbalize two of their positive qualities °Patient will demonstrate insight but naming social supports in their lives °Patient will verbalize their feelings when voicing positive self affirmations and when voicing positive affirmations of others °Patients will discuss the potential positive impact on their wellness/recovery of focusing on positive traits of self and others. ° °Summary of Patient Progress: The Pt attended group and remained there the entire time.  The Pt accepted the worksheet that was provided and followed along.  The Pt was appropriate during discussion and was appropriate with their peers. ° °Kiahna Banghart M Keltin Baird, LCSWA °11/27/2021  1:58 PM   ° °

## 2021-11-27 NOTE — Progress Notes (Signed)
°   11/27/21 0530  Sleep  Number of Hours 7.75

## 2021-11-27 NOTE — [facility] Group Notes (Signed)
PT was informed but did not attend group. ?

## 2021-11-27 NOTE — Progress Notes (Signed)
[facility] MD Progress Note  11/27/2021 5:05 PM FREDRICK GEOGHEGAN  MRN:  [id number] Subjective:  Jeffrey Holland is a 36 y.o. male with no known past psychiatric history, who was initially admitted for inpatient psychiatric hospitalization on 11/15/2021 for management of hallucinations and delusions.     Case was discussed in the multidisciplinary team. MAR was reviewed and patient was compliant with medications. He did not require PRN medications for agitation.     Psychiatric Team made the following recommendations yesterday:  -Increase Haldol to 7.44m daily and 15 mg nightly for residual hallucinations - Discontinue PRN Trazodone - Continue Remeron 7.5417mfor depressed mood -  Continue Cogentin 0.17m517mid PRN  - Continue Lithium to 450m77mD             - Lithium lvl 1/15: 0.30 - Agitation Protocol: Haldol 17mg 61mor po q8h PRN, Ativan 1mg p94mr 2mg IM51m8 hours PRN, Benadryl 50mg IM53mpo q8 hours PRN  Per staff patient has been noted to talk to himself both in his room and in the hall.  Patient is also appeared to be more isolated since [place] [address] also appears to be less paranoid of staff in the hospital in general.  On assessment today patient reports that [place] finds [place] [facility] and that he likes being on this Hall.  [place] reports that he is sleeping okay but he continues to have nightmares that are bothering him.  Patient reports that he feels that the voices of his family and his boss are only getting ladder in his head and may be contributing to his nightmares as well.  Patient reports that he is getting more bothered by the voices and endorses that they are trying to have conversations with him.  Patient reports that he feels that the auditory hallucinations are making him feel a little bit more irritable and irritated.  Otherwise patient denies SI, HI and VH. Principal Problem: Schizophrenia spectrum disorder with psychotic disorder type not yet determined  (HCC) DiaTracysis: Principal Problem:   Schizophrenia spectrum disorder with psychotic disorder type not yet determined (HCC) ActEhrhardt Problems:   Alcohol abuse   Cannabis abuse   Cocaine abuse, episodic use (HCC)   HJonestitis C  Total Time spent with patient: 20 minutes  Past Psychiatric History: See H&P  Past Medical History:  Past Medical History:  Diagnosis Date   Anxiety    Asthma    Panic    History reviewed. No pertinent surgical history. Family History: History reviewed. No pertinent family history. Family Psychiatric  History: See H&P Social History:  Social History   Substance and Sexual Activity  Alcohol Use Yes     Social History   Substance and Sexual Activity  Drug Use Yes   Types: Marijuana, Methamphetamines, Cocaine    Social History   Socioeconomic History   Marital status: Single    Spouse name: Not on file   Number of children: Not on file   Years of education: Not on file   Highest education level: Not on file  Occupational History   Not on file  Tobacco Use   Smoking status: Every Day    Packs/day: 1.00    Types: Cigarettes   Smokeless tobacco: Not on file  Vaping Use   Vaping Use: Every day  Substance and Sexual Activity   Alcohol use: Yes   Drug use: Yes    Types: Marijuana, Methamphetamines, Cocaine   Sexual  activity: Yes    Birth control/protection: None  Other Topics Concern   Not on file  Social History Narrative   ** Merged History Encounter **       Social Determinants of Health   Financial Resource Strain: Not on file  Food Insecurity: Not on file  Transportation Needs: Not on file  Physical Activity: Not on file  Stress: Not on file  Social Connections: Not on file   Additional Social History:                         Sleep: Fair  Appetite:  Good  Current Medications: Current Facility-Administered Medications  Medication Dose Route Frequency Provider Last Rate Last Admin   acetaminophen (TYLENOL)  tablet 650 mg  650 mg Oral Q6H PRN Prescilla Sours, PA-C   650 mg at 11/23/21 1330   albuterol (VENTOLIN HFA) 108 (90 Base) MCG/ACT inhaler 2 puff  2 puff Inhalation Q6H PRN Harlow Asa, MD       alum & mag hydroxide-simeth (MAALOX/MYLANTA) 200-200-20 MG/5ML suspension 30 mL  30 mL Oral Q4H PRN Lovena Le, [name], PA-C       benztropine (COGENTIN) tablet 0.5 mg  0.5 mg Oral BID PRN Harlow Asa, MD       diphenhydrAMINE (BENADRYL) capsule 50 mg  50 mg Oral Q8H PRN Margorie John W, PA-C   50 mg at 11/20/21 2223   Or   diphenhydrAMINE (BENADRYL) injection 50 mg  50 mg Intramuscular Q8H PRN Margorie John W, PA-C   50 mg at 11/19/21 1255   famotidine (PEPCID) tablet 20 mg  20 mg Oral BID Damita Dunnings B, MD   20 mg at 11/27/21 1630   feeding supplement (ENSURE ENLIVE / ENSURE PLUS) liquid 237 mL  237 mL Oral BID BM Kiala Faraj, Joyce Gross B, MD   237 mL at 11/27/21 1356   haloperidol (HALDOL) tablet 15 mg  15 mg Oral Q2000 Damita Dunnings B, MD   15 mg at 11/26/21 2116   haloperidol (HALDOL) tablet 5 mg  5 mg Oral Q8H PRN Prescilla Sours, PA-C   5 mg at 11/18/21 2311   Or   haloperidol lactate (HALDOL) injection 5 mg  5 mg Intramuscular Q8H PRN Margorie John W, PA-C   5 mg at 11/19/21 1255   haloperidol (HALDOL) tablet 7.5 mg  7.5 mg Oral Q breakfast Damita Dunnings B, MD   7.5 mg at 11/27/21 0786   lithium carbonate capsule 450 mg  450 mg Oral BID WC Damita Dunnings B, MD   450 mg at 11/27/21 1630   LORazepam (ATIVAN) tablet 1 mg  1 mg Oral Q8H PRN Freida Busman, MD       Or   LORazepam (ATIVAN) injection 2 mg  2 mg Intramuscular Q8H PRN Damita Dunnings B, MD       magnesium hydroxide (MILK OF MAGNESIA) suspension 30 mL  30 mL Oral Daily PRN Margorie John W, PA-C       mirtazapine (REMERON) tablet 7.5 mg  7.5 mg Oral QHS Damita Dunnings B, MD   7.5 mg at 11/26/21 2115   multivitamin with minerals tablet 1 tablet  1 tablet Oral Daily Harlow Asa, MD   1 tablet at 11/27/21 0742   nicotine (NICODERM CQ - dosed in  mg/24 hours) patch 21 mg  21 mg Transdermal Daily Hill, Jackie Plum, MD   21 mg at 11/27/21 0745   QUEtiapine (SEROQUEL)  tablet 25 mg  25 mg Oral QHS Damita Dunnings B, MD       QUEtiapine (SEROQUEL) tablet 25 mg  25 mg Oral Daily PRN Damita Dunnings B, MD       thiamine tablet 100 mg  100 mg Oral Daily Nelda Marseille, Amy E, MD   100 mg at 11/27/21 5462    Lab Results:  Results for orders placed or performed during the hospital encounter of 11/15/21 (from the past 48 hour(s))  Lithium level     Status: Abnormal   Collection Time: 11/26/21  6:24 AM  Result Value Ref Range   Lithium Lvl 0.30 (L) 0.60 - 1.20 mmol/L    Comment: Performed at [facility], [place] [address], [place], [place] [address]    Blood Alcohol level:  Lab Results  Component Value Date   ETH <10 11/14/2021   ETH <10 09/38/1829    Metabolic Disorder Labs: Lab Results  Component Value Date   HGBA1C 5.5 11/15/2021   MPG 111.15 11/15/2021   No results found for: PROLACTIN Lab Results  Component Value Date   CHOL 151 11/15/2021   TRIG 98 11/15/2021   HDL 42 11/15/2021   CHOLHDL 3.6 11/15/2021   VLDL 20 11/15/2021   LDLCALC 89 11/15/2021    Physical Findings: AIMS: Facial and Oral Movements Muscles of Facial Expression: None, normal Lips and Perioral Area: None, normal Jaw: None, normal Tongue: None, normal,Extremity Movements Upper (arms, wrists, hands, fingers): None, normal Lower (legs, knees, ankles, toes): None, normal, Trunk Movements Neck, shoulders, hips: None, normal, Overall Severity Severity of abnormal movements (highest score from questions above): None, normal Incapacitation due to abnormal movements: None, normal Patient's awareness of abnormal movements (rate only patient's report): No Awareness, Dental Status Current problems with teeth and/or dentures?: No Does patient usually wear dentures?: No  CIWA:  CIWA-Ar Total: 0 COWS:     Musculoskeletal: Strength & Muscle  Tone: within normal limits Gait & Station: normal Patient leans: N/A  Psychiatric Specialty Exam:  Presentation  General Appearance: Appropriate for Environment; Casual  Eye Contact:Good  Speech:Clear and Coherent  Speech Volume:Normal  Handedness:Right   Mood and Affect  Mood:-- (Less emotional)  Affect:Congruent   Thought Process  Thought Processes:Goal Directed  Descriptions of Associations:Circumstantial  Orientation:Full (Time, Place and Person)  Thought Content:Perseveration  History of Schizophrenia/Schizoaffective disorder:No  Duration of Psychotic Symptoms:Less than six months  Hallucinations:Hallucinations: Auditory  Ideas of Reference:None  Suicidal Thoughts:Suicidal Thoughts: No  Homicidal Thoughts:Homicidal Thoughts: No   Sensorium  Memory:Immediate Fair  Judgment:-- (Improving)  Insight:Shallow   Executive Functions  Concentration:Fair  Attention Span:Fair  [place]   Psychomotor Activity  Psychomotor Activity:Psychomotor Activity: Normal   Assets  Assets:Resilience; Housing; Social Support   Sleep  Sleep:Sleep: Fair    Physical Exam: Physical Exam Constitutional:      Appearance: Normal appearance.  HENT:     Head: Normocephalic and atraumatic.  Pulmonary:     Effort: Pulmonary effort is normal.  Neurological:     Mental Status: He is alert.   Review of Systems  Psychiatric/Behavioral:  Positive for hallucinations. Negative for suicidal ideas.   Blood pressure 129/82, pulse 88, temperature 98.4 F (36.9 C), temperature source Oral, resp. rate 17, height 5' 6"  (1.676 m), weight 62.6 kg, SpO2 98 %. Body mass index is 22.27 kg/m.   Treatment Plan Summary: Daily contact with patient to assess and evaluate symptoms and progress in treatment and Medication management Chrissie Noa  Rivkin is a 36 yo patient w/ no known PPH who presented endorsing AVH, delusions, and  depressed mood.  Despite multiple days in a relatively moderate-sized dose of Haldol patient continues to have some residual auditory hallucinations.  Patient's paranoia appears to have significantly improved and he is no longer endorsing visual hallucinations.  Patient also appears more emotionally stable.  Due to patient's paranoia decreasing he appears to be more honest during his assessments and is more comfortable acknowledging that he is having auditory hallucinations and his insight has improved as today he suggested that this may not be right.  Patient would likely benefit from an acute course of dual antipsychotic therapy as he is currently already on a medication that is a strong D2 receptor agonist.  Would like to start patient on a medication that may also help target some of the serotonin receptors to address the residual psychosis.  We will start patient on low-dose Seroquel and also I will offer as needed Seroquel for anxiety.  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 Psychiatric Diagnoses and Treatment:              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 (r/o Substance induced psychosis vs MDD, recurrent, severe w/ psychotic features vs Schizophreniform disorder vs Bipolar disorder, current episode  depressed w/ psychotic features vs psychosis secondary to general medical condition) -Continue Haldol to 7.77m daily and 15 mg nightly for residual hallucinations -Start Seroquel 25 mg twice daily, will likely change to 50 mg nightly tomorrow - Continue Remeron 7.530mfor depressed mood -  Continue Cogentin 0.60m360mid PRN  - Continue Lithium to 450m21mD             - Lithium lvl 1/15: 0.30 - Agitation Protocol: Haldol 60mg 70mor po q8h PRN, Ativan 1mg p43mr 2mg IM20m8 hours PRN, Benadryl 50mg IM62mpo q8 hours PRN -- New onset psychosis w/u: Negative noncontrast head CT on 11/13/2021, Hep C Ab positive; ESR 2, ceruloplasmin 20.5, HIV nonreactive, B12 348, PR nonreactive, ANA negative, TSH 0.667, UDS positive for THC, alcohol less than 10, BMP within normal limits other than a glucose of 123, white count 13.1 trending down to 10.2, AST elevated at 62 and ALT elevated at 103, B1 125.3- Labs Pending:  Heavy metals --Antipsychotic lab monitoring: EKG: QTC- 432 and on repeat QTC 403ms, A118m5, Lipids WNL -Repeat EKG ordered             -- Continue Trazodone 100mg qhs 46m  insomnia and improved daytime sleep hygiene encouraged             -- Encouraged patient to participate in unit milieu and in scheduled group therapies                EtoH use (r/o alcohol use d/o) Cocaine use - episodic (r/o stimulant use d/o) 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 Medical Issues Being Addressed:              Tobacco Use Disorder             -- Nicotine patch 64m/24 hours ordered, scheduled             -- Smoking cessation encouraged               Hx of Gastric Ulcers -- Continue home Pepcid 252mBID   Hep C ab positive/Transaminitis -- Hep C RNA Quant lab-353,000 - Will need f/u OP -- AST trending up to 62> 94 and ALT up to 103> 109 (has been at AST 155 and ALT 228 2 years ago)  with T bili 1.4 > 0.9 and indirect bili 1.3 > 0.8 - Previously Consulted GI, Dr. 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  - RUQ [place] please see reading, recommend patient f/u OP as there is nothing acutely concerning, SW to schedule PCP   Leukocytosis - resolved -- Repeat CBC shows WBC 10.2 on 11/17/21   4. Discharge Planning:              -- Social work and case management to assist with discharge planning and identification of hospital follow-up needs prior to discharge             -- Discharge Concerns: Need to establish a safety plan; Medication compliance and effectiveness             -- Discharge Goals: Return home with outpatient referrals for mental health follow-up including medication management/psychotherapy       PGY-2 JaFreida BusmanMD 11/27/2021, 5:05 PM

## 2021-11-27 NOTE — Progress Notes (Signed)
Pt denies SI/HI/AVH and verbally agrees to approach staff if these become apparent or before harming themselves/others. Rates depression 0/10. Rates anxiety 10/10. Rates pain 0/10.  Writer has not seen pt responding. Pt has been interacting well with others and going to groups. Pt has improved. Pt stated that when he tried to shut his eyes, the voices were constantly going and he was restless. PRN was given. Scheduled medications administered to pt, per MD orders. RN provided support and encouragement to pt. Q15 min safety checks implemented and continued. Pt safe on the unit. RN will continue to monitor and intervene as needed.   11/27/21 0742  Psych Admission Type (Psych Patients Only)  Admission Status Involuntary  Psychosocial Assessment  Patient Complaints None  Eye Contact Brief  Facial Expression Sad  Affect Appropriate to circumstance  Speech Logical/coherent  Interaction Assertive  Motor Activity Other (Comment) (WDL)  Appearance/Hygiene Unremarkable  Behavior Characteristics Cooperative;Appropriate to situation;Calm  Mood Preoccupied;Pleasant  Thought Process  Coherency Circumstantial;Disorganized  Content Blaming self  Delusions None reported or observed  Perception WDL  Hallucination None reported or observed  Judgment Impaired  Confusion Mild  Danger to Self  Current suicidal ideation? Denies  Danger to Others  Danger to Others None reported or observed

## 2021-11-28 MED ORDER — QUETIAPINE FUMARATE 50 MG PO TABS
50.0000 mg | ORAL_TABLET | Freq: Every day | ORAL | Status: DC
  Administered 2021-11-28 – 2021-12-03 (×6): 50 mg via ORAL
  Filled 2021-11-28 (×7): qty 1

## 2021-11-28 NOTE — Group Note (Signed)
Recreation Therapy Group Note   Group Topic:Animal Assisted Therapy   Group Date: 11/28/2021 Start Time: 1430 End Time: 1515 Facilitators: Caroll Rancher, LRT,CTRS Location: [address] Dayroom   Animal-Assisted Activity (AAA) Program Checklist/Progress Note Patient Eligibility Criteria Checklist & Daily Group note for Rec Tx Intervention   AAA/T Program Assumption of Risk Form signed by Patient/ or Parent Legal Guardian YES  Patient understands their participation is voluntary YES   Group Description: Patients provided opportunity to interact with trained and credentialed [employer] Therapy dog and the community volunteer/dog handler. Patients practiced appropriate animal interaction and were educated on dog safety outside of the hospital in common community settings. Patients were allowed to use dog toys and other items to practice commands, engage the dog in play, and/or complete routine aspects of animal care.   Education: Charity fundraiser, Health visitor, Communication & Social Skills    Affect/Mood: N/A   Participation Level: Did not attend    Clinical Observations/Individualized Feedback:     Plan: Continue to engage patient in RT group sessions 2-3x/week.   Caroll Rancher, Antonietta Jewel 11/28/2021 3:54 PM

## 2021-11-28 NOTE — Progress Notes (Signed)
D- Patient alert and oriented x4. Pt came out of his room complaining of a sleep disturbance. Pt stated he was asleep and couldn't wake up and he was yelling for someone to help him wake up. Pt stated he was able to open his eyes after one minute and "get out of his dream". Pt stated it scared him and he never felt something like that. Pt stated he is scared to go back to sleep. Pt also complained of seeing "black spots " every where he looks and doesn't know what can be causing it.    A- Pt given reassurance and made aware to keep his door open so he feels more comfortable going back to sleep.    R- Patient receptive, calm, and cooperative.  Pt stated he feels better and more secure by leaving the door open .

## 2021-11-28 NOTE — [facility] Group Notes (Signed)
PT was informed but did not attend group. ?

## 2021-11-28 NOTE — Progress Notes (Addendum)
D. Pt has been appropriate during interactions- minimal but friendly. Pt denied SI/HI and stated that the AH have lessened. Per pt's self inventory, pt rated his depression, hopelessness and anxiety a 1/0/0, respectively. Pt reported that his goal today was to work on "Getting out. Going back to work." A. Labs and vitals monitored. Pt compliant with medications. Pt supported emotionally and encouraged to express concerns and ask questions.   R. Pt remains safe with 15 minute checks. Will continue POC.   11/28/21 1500  Psych Admission Type (Psych Patients Only)  Admission Status Involuntary  Psychosocial Assessment  Patient Complaints None  Eye Contact Fair  Facial Expression Animated;Worried  Affect Anxious  Lobbyist Cooperative;Appropriate to situation  Mood Pleasant;Anxious  Aggressive Behavior  Effect No apparent injury  Thought Process  Coherency Circumstantial  Content Blaming self  Delusions None reported or observed  Perception WDL  Hallucination None reported or observed  Judgment Impaired  Confusion Mild  Danger to Self  Current suicidal ideation? Denies  Danger to Others  Danger to Others None reported or observed

## 2021-11-28 NOTE — Progress Notes (Addendum)
[facility] MD Progress Note  11/28/2021 3:49 PM Jeffrey Holland  MRN:  [id number]  Subjective:  Jeffrey Holland is a 36 y.o. male with no known past psychiatric history, who was initially admitted for inpatient psychiatric hospitalization on 11/15/2021 for management of hallucinations and delusions.     Case was discussed in the multidisciplinary team. MAR was reviewed and patient was compliant with medications. He did not require PRN medications for agitation.   Psychiatric Team made the following recommendations yesterday:  -Continue Haldol 7.78m daily and 15 mg nightly for residual hallucinations -Start Seroquel 25 mg twice daily, will likely change to 50 mg nightly tomorrow - Continue Remeron 7.537mfor depressed mood -  Continue Cogentin 0.56m67mid PRN  - Continue Lithium to 450m19mD             - Lithium lvl 1/15: 0.30  On assessment today patient reports that he did not sleep well due to sleep disturbance. This was corroborated by night shift RN. Patient reports that he woke up terrified from a dream and struggled to get out of his dream. Patient reports that during the day he feels that his mood is getting better. Patient reports that his sleeping is getting worse. Patient reports that he feels that the medication adjustments have helped as he is no longer having AH. Patient reports that the AH [place] early this AM and he no longer feels that his thoughts are so loud that other's can hear them. Patient and denies thoughts that he is telepathic or psychic and endorses that having this is power is odd. Patient otherwise denies VH, SI and HI. He denies ideas of reference. Patient reports that he does not believe he is having any negative side effects from his medications.   Principal Problem: Schizophrenia spectrum disorder with psychotic disorder type not yet determined (HCC)[place]: Principal Problem:   Schizophrenia spectrum disorder with psychotic disorder type not yet determined  (HCC)[place] Problems:   Alcohol abuse   Cannabis abuse   Cocaine abuse, episodic use (HCC)[place]  Total Time Spent in Direct Patient Care:  I personally spent 25 minutes on the unit in direct patient care. The direct patient care time included face-to-face time with the patient, reviewing the patient's chart, communicating with other professionals, and coordinating care. Greater than 50% of this time was spent in counseling or coordinating care with the patient regarding goals of hospitalization, psycho-education, and discharge planning needs.   Past Psychiatric History: See H&P  Past Medical History:  Past Medical History:  Diagnosis Date   Anxiety    Asthma    Panic     Family History: see H&P  Family Psychiatric  History: See H&P  Social History:  Social History   Substance and Sexual Activity  Alcohol Use Yes     Social History   Substance and Sexual Activity  Drug Use Yes   Types: Marijuana, Methamphetamines, Cocaine    Social History   Socioeconomic History   Marital status: Single    Spouse name: Not on file   Number of children: Not on file   Years of education: Not on file   Highest education level: Not on file  Occupational History   Not on file  Tobacco Use   Smoking status: Every Day    Packs/day: 1.00    Types: Cigarettes   Smokeless tobacco: Not on file  Vaping Use   Vaping Use: Every day  Substance and Sexual Activity  Alcohol use: Yes   Drug use: Yes    Types: Marijuana, Methamphetamines, Cocaine   Sexual activity: Yes    Birth control/protection: None  Other Topics Concern   Not on file  Social History Narrative   ** Merged History Encounter **       Social Determinants of Health   Financial Resource Strain: Not on file  Food Insecurity: Not on file  Transportation Needs: Not on file  Physical Activity: Not on file  Stress: Not on file  Social Connections: Not on file   Sleep: Poor per patient self report; 8.25 hours  per nursing  Appetite:  Good  Current Medications: Current Facility-Administered Medications  Medication Dose Route Frequency Provider Last Rate Last Admin   acetaminophen (TYLENOL) tablet 650 mg  650 mg Oral Q6H PRN Margorie John W, PA-C   650 mg at 11/23/21 1330   albuterol (VENTOLIN HFA) 108 (90 Base) MCG/ACT inhaler 2 puff  2 puff Inhalation Q6H PRN Harlow Asa, MD       alum & mag hydroxide-simeth (MAALOX/MYLANTA) 200-200-20 MG/5ML suspension 30 mL  30 mL Oral Q4H PRN Lovena Le, [name], PA-C       benztropine (COGENTIN) tablet 0.5 mg  0.5 mg Oral BID PRN Nelda Marseille, Lucilla Petrenko E, MD       diphenhydrAMINE (BENADRYL) capsule 50 mg  50 mg Oral Q8H PRN Margorie John W, PA-C   50 mg at 11/20/21 2223   Or   diphenhydrAMINE (BENADRYL) injection 50 mg  50 mg Intramuscular Q8H PRN Margorie John W, PA-C   50 mg at 11/19/21 1255   famotidine (PEPCID) tablet 20 mg  20 mg Oral BID Damita Dunnings B, MD   20 mg at 11/28/21 0813   feeding supplement (ENSURE ENLIVE / ENSURE PLUS) liquid 237 mL  237 mL Oral BID BM McQuilla, Joyce Gross B, MD   237 mL at 11/28/21 1516   haloperidol (HALDOL) tablet 15 mg  15 mg Oral Q2000 Damita Dunnings B, MD   15 mg at 11/27/21 2105   haloperidol (HALDOL) tablet 5 mg  5 mg Oral Q8H PRN Prescilla Sours, PA-C   5 mg at 11/18/21 2311   Or   haloperidol lactate (HALDOL) injection 5 mg  5 mg Intramuscular Q8H PRN Margorie John W, PA-C   5 mg at 11/19/21 1255   haloperidol (HALDOL) tablet 7.5 mg  7.5 mg Oral Q breakfast Damita Dunnings B, MD   7.5 mg at 11/28/21 0813   lithium carbonate capsule 450 mg  450 mg Oral BID WC Damita Dunnings B, MD   450 mg at 11/28/21 0813   LORazepam (ATIVAN) tablet 1 mg  1 mg Oral Q8H PRN Freida Busman, MD       Or   LORazepam (ATIVAN) injection 2 mg  2 mg Intramuscular Q8H PRN Damita Dunnings B, MD       magnesium hydroxide (MILK OF MAGNESIA) suspension 30 mL  30 mL Oral Daily PRN Margorie John W, PA-C       mirtazapine (REMERON) tablet 7.5 mg  7.5 mg Oral QHS Damita Dunnings B, MD   7.5 mg at 11/27/21 2105   multivitamin with minerals tablet 1 tablet  1 tablet Oral Daily Harlow Asa, MD   1 tablet at 11/28/21 0813   nicotine (NICODERM CQ - dosed in mg/24 hours) patch 21 mg  21 mg Transdermal Daily Maida Sale, MD   21 mg at 11/28/21 0812   QUEtiapine (SEROQUEL) tablet  25 mg  25 mg Oral Daily PRN Damita Dunnings B, MD   25 mg at 11/27/21 1852   QUEtiapine (SEROQUEL) tablet 50 mg  50 mg Oral QHS Damita Dunnings B, MD       thiamine tablet 100 mg  100 mg Oral Daily Nelda Marseille, Siyon Linck E, MD   100 mg at 11/28/21 0813    Lab Results: No results found for this or any previous visit (from the past 46 hour(s)).  Blood Alcohol level:  Lab Results  Component Value Date   ETH <10 11/14/2021   ETH <10 32/20/2542    Metabolic Disorder Labs: Lab Results  Component Value Date   HGBA1C 5.5 11/15/2021   MPG 111.15 11/15/2021   No results found for: PROLACTIN Lab Results  Component Value Date   CHOL 151 11/15/2021   TRIG 98 11/15/2021   HDL 42 11/15/2021   CHOLHDL 3.6 11/15/2021   VLDL 20 11/15/2021   LDLCALC 89 11/15/2021    Physical Findings: AIMS: Facial and Oral Movements Muscles of Facial Expression: None, normal Lips and Perioral Area: None, normal Jaw: None, normal Tongue: None, normal,Extremity Movements Upper (arms, wrists, hands, fingers): None, normal Lower (legs, knees, ankles, toes): None, normal, Trunk Movements Neck, shoulders, hips: None, normal, Overall Severity Severity of abnormal movements (highest score from questions above): None, normal Incapacitation due to abnormal movements: None, normal Patient's awareness of abnormal movements (rate only patient's report): No Awareness, Dental Status Current problems with teeth and/or dentures?: No Does patient usually wear dentures?: No  CIWA:  CIWA-Ar Total: 0  Musculoskeletal: Strength & Muscle Tone: within normal limits Gait & Station: normal Patient leans: N/A  Psychiatric  Specialty Exam:  Presentation  General Appearance: Appropriate for Environment  Eye Contact:Fair  Speech:Clear and Coherent  Speech Volume:Normal  Handedness:Right   Mood and Affect  Mood: described as improving - appears more euthymic  Affect:calm, polite, less guarded   Thought Process  Thought Processes:Goal Directed  Descriptions of Associations:intact  Orientation:Full (Time, Place and Person)  Thought Content:Denies AVH, paranoia, ideas of reference or first rank symptoms; does not make delusional statements today; is not grossly responding to internal/external stimuli on exam  History of Schizophrenia/Schizoaffective disorder:No  Duration of Psychotic Symptoms:Less than six months  Hallucinations:Hallucinations: None  Ideas of Reference:None  Suicidal Thoughts:Suicidal Thoughts: No  Homicidal Thoughts:Homicidal Thoughts: No   Sensorium  Memory:Immediate Fair; Recent Fair  Judgment:- Improving  Insight:- Improving  Executive Functions  Concentration:Fair  Attention Span:Fair  Beatrice of Knowledge:Fair  Language:Fair   Psychomotor Activity  Psychomotor Activity:Normal - AIMS 0; no cogwheeling, no stiffness, no tremor   Assets  Assets:Communication Skills; Resilience   Sleep  8.25 hours recorded  Physical Exam Constitutional:      Appearance: Normal appearance.  HENT:     Head: Normocephalic and atraumatic.  Pulmonary:     Effort: Pulmonary effort is normal.  Neurological:     Mental Status: He is alert and oriented to person, place, and time.   Review of Systems  Respiratory:  Negative for shortness of breath.   Cardiovascular:  Negative for chest pain.  Gastrointestinal:  Negative for constipation, diarrhea, nausea and vomiting.  Psychiatric/Behavioral:  Negative for hallucinations and suicidal ideas.   Blood pressure 124/84, pulse 78, temperature (!) 97.5 F (36.4 C), temperature source Oral, resp. rate 16,  height 5' 6"  (1.676 m), weight 62.6 kg, SpO2 99 %. Body mass index is 22.27 kg/m.   Treatment Plan Summary: Daily contact with patient  to assess and evaluate symptoms and progress in treatment and Medication management  Joshual Terrio is a 36 yo patient w/ no known PPH who presented endorsing AVH, delusions, and depressed mood. Patient denies [place] for the first time since admission. It appears a low dose of Seroquel has been beneficial at eradicating his remaining psychotic symptoms. Patient did receive a PRN dose of Seroquel in edition to the scheduled doses. Patient is also endorsing some sleep issues overnight. Will continue to monitor to insure resolution of psychotic symptoms including his paranoia.   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 Psychiatric Diagnoses and Treatment:              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 (r/o Substance induced psychosis vs MDD, recurrent, severe w/ psychotic features vs Schizophreniform disorder vs Bipolar disorder, current episode depressed w/ psychotic features vs psychosis secondary to general medical condition) -Continue Haldol 7.23m daily and 15 mg nightly for residual hallucinations -Increase Seroquel to 50 mg QHS and continue Seroquel 260mbid PRN for anxiety - Continue Remeron 7.38m64mor  depressed mood -  Continue Cogentin 0.38mg68md PRN EPS - Continue Lithium to 450mg9m             - Lithium lvl 1/15: 0.30 - Repeat Li lvl, CBC, TSH, and BMP tom AM - Agitation Protocol: Haldol 38mg I13mr po q8h PRN, Ativan 1mg po52m 2mg IM 61m hours PRN, Benadryl 50mg IM 74mo q8 hours PRN -- New onset psychosis w/u: Negative noncontrast head CT on 11/13/2021, Hep C Ab positive; ESR 2, ceruloplasmin 20.5, HIV nonreactive, B12 348, RPR nonreactive, ANA negative, TSH 0.667, UDS positive for THC, alcohol less than 10, BMP within normal limits other than a glucose of 123, white count 13.1 trending down to 10.2, AST elevated at 62 and ALT elevated at 103, B1 125.3, Heavy metals negative --Antipsychotic lab monitoring: EKG: QTC 408ms on 144m23, A1c 5.5, Lipids WNL             -- Encouraged patient to participate in unit milieu and in scheduled group therapies                EtoH use (r/o alcohol use d/o) Cocaine use - episodic (r/o stimulant use d/o) 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 Medical Issues Being Addressed:  Tobacco Use Disorder             -- Nicotine patch 5m/24 hours ordered, scheduled             -- Smoking cessation encouraged               Hx of Gastric Ulcers -- Continue home Pepcid 232mBID   Hep C ab positive/Transaminitis -- Hep C RNA Quant lab-353,000 - Will need f/u OP -- AST trending up to 62> 94 and ALT up to 103> 109 (has been at AST 155 and ALT 228 2 years ago) with T bili 1.4 > 0.9 and indirect bili 1.3 > 0.8 - Previously Consulted GI, Dr. 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  - RUQ [place] please see reading, recommend patient f/u OP as there is nothing acutely concerning, SW to schedule PCP   Leukocytosis - resolved -- Repeat CBC shows  WBC 10.2 on 11/17/21   4. Discharge Planning:              -- Social work and case management to assist with discharge planning and identification of hospital follow-up needs prior to discharge             -- Discharge Concerns: Need to establish a safety plan; Medication compliance and effectiveness             -- Discharge Goals: Return home with outpatient referrals for mental health follow-up including medication management/psychotherapy   PGY-2 JaFreida BusmanMD 11/28/2021, 3:49 PM

## 2021-11-28 NOTE — Progress Notes (Signed)
D- Patient alert and oriented x4. Patient in stable mood.  Denies SI, HI, AVH. Pt has minimal interactions with peers. P   A- Scheduled medications administered to patient, per MD orders.Routine safety checks conducted every 15 minutes.  Patient informed to notify staff with problems or concerns.   R- Patient compliant with medications and verbalized understanding treatment plan. Patient receptive, calm, and cooperative.    11/27/21 2035  Psych Admission Type (Psych Patients Only)  Admission Status Involuntary  Psychosocial Assessment  Patient Complaints Sleep disturbance;Worrying;Anxiety;Suspiciousness  Eye Contact Fair  Facial Expression Animated;Worried  Affect Anxious  Speech Logical/coherent  Interaction Cautious  Motor Activity Fidgety  Appearance/Hygiene Unremarkable  Behavior Characteristics Appropriate to situation;Cooperative;Fidgety;Anxious  Mood Labile;Suspicious  Thought Process  Coherency Circumstantial;Disorganized  Content Blaming self  Delusions None reported or observed  Perception WDL  Hallucination None reported or observed  Judgment Impaired  Confusion Mild  Danger to Self  Current suicidal ideation? Denies  Danger to Others  Danger to Others None reported or observed

## 2021-11-29 ENCOUNTER — Encounter (HOSPITAL_COMMUNITY): Payer: Self-pay

## 2021-11-29 LAB — BASIC METABOLIC PANEL
Anion gap: 6 (ref 5–15)
BUN: 17 mg/dL (ref 6–20)
CO2: 25 mmol/L (ref 22–32)
Calcium: 9 mg/dL (ref 8.9–10.3)
Chloride: 106 mmol/L (ref 98–111)
Creatinine, Ser: 0.76 mg/dL (ref 0.61–1.24)
GFR, Estimated: >60 mL/min (ref >60)
Glucose, Bld: 98 mg/dL (ref 70–99)
Potassium: 4.2 mmol/L (ref 3.5–5.1)
Sodium: 137 mmol/L (ref 135–145)

## 2021-11-29 LAB — CBC WITH DIFFERENTIAL/PLATELET
Abs Immature Granulocytes: 0.14 10*3/uL — ABNORMAL HIGH (ref 0.00–0.07)
Basophils Absolute: 0.1 10*3/uL (ref 0.0–0.1)
Basophils Relative: 1 %
Eosinophils Absolute: 0.5 10*3/uL (ref 0.0–0.5)
Eosinophils Relative: 4 %
HCT: 48.3 % (ref 39.0–52.0)
Hemoglobin: 16 g/dL (ref 13.0–17.0)
Immature Granulocytes: 1 %
Lymphocytes Relative: 33 %
Lymphs Abs: 3.6 10*3/uL (ref 0.7–4.0)
MCH: 33.8 pg (ref 26.0–34.0)
MCHC: 33.1 g/dL (ref 30.0–36.0)
MCV: 101.9 fL — ABNORMAL HIGH (ref 80.0–100.0)
Monocytes Absolute: 1.2 10*3/uL — ABNORMAL HIGH (ref 0.1–1.0)
Monocytes Relative: 11 %
Neutro Abs: 5.5 10*3/uL (ref 1.7–7.7)
Neutrophils Relative %: 50 %
Platelets: 226 10*3/uL (ref 150–400)
RBC: 4.74 MIL/uL (ref 4.22–5.81)
RDW: 12.6 % (ref 11.5–15.5)
WBC: 10.9 10*3/uL — ABNORMAL HIGH (ref 4.0–10.5)
nRBC: 0 % (ref 0.0–0.2)

## 2021-11-29 LAB — TSH: TSH: 2.739 u[IU]/mL (ref 0.350–4.500)

## 2021-11-29 LAB — LITHIUM LEVEL: Lithium Lvl: 0.25 mmol/L — ABNORMAL LOW (ref 0.60–1.20)

## 2021-11-29 MED ORDER — HALOPERIDOL 5 MG PO TABS
5.0000 mg | ORAL_TABLET | Freq: Two times a day (BID) | ORAL | Status: DC
  Administered 2021-11-30 – 2021-12-04 (×9): 5 mg via ORAL
  Filled 2021-11-29 (×11): qty 1

## 2021-11-29 MED ORDER — LITHIUM CARBONATE 300 MG PO CAPS
600.0000 mg | ORAL_CAPSULE | Freq: Two times a day (BID) | ORAL | Status: DC
  Administered 2021-11-30 – 2021-12-04 (×9): 600 mg via ORAL
  Filled 2021-11-29 (×12): qty 2

## 2021-11-29 MED ORDER — HALOPERIDOL 5 MG PO TABS
10.0000 mg | ORAL_TABLET | Freq: Every day | ORAL | Status: DC
  Administered 2021-11-30 – 2021-12-03 (×4): 10 mg via ORAL
  Filled 2021-11-29 (×5): qty 2

## 2021-11-29 MED ORDER — HALOPERIDOL 5 MG PO TABS
15.0000 mg | ORAL_TABLET | Freq: Every day | ORAL | Status: AC
  Administered 2021-11-29: 15 mg via ORAL
  Filled 2021-11-29: qty 3

## 2021-11-29 MED ORDER — QUETIAPINE FUMARATE 25 MG PO TABS
25.0000 mg | ORAL_TABLET | Freq: Two times a day (BID) | ORAL | Status: DC
  Administered 2021-11-29 – 2021-12-03 (×10): 25 mg via ORAL
  Filled 2021-11-29 (×13): qty 1

## 2021-11-29 MED ORDER — QUETIAPINE FUMARATE 25 MG PO TABS: 25.0000 mg | ORAL_TABLET | Freq: Two times a day (BID) | ORAL | Status: DC

## 2021-11-29 MED ORDER — HALOPERIDOL 5 MG PO TABS: 10.0000 mg | ORAL_TABLET | Freq: Every day | ORAL | Status: DC

## 2021-11-29 MED ORDER — HALOPERIDOL 5 MG PO TABS
5.0000 mg | ORAL_TABLET | Freq: Two times a day (BID) | ORAL | Status: DC
Start: 2021-11-29 — End: 2021-11-29

## 2021-11-29 NOTE — BH IP Treatment Plan (Signed)
Interdisciplinary Treatment and Diagnostic Plan Update  11/29/2021 Time of Session: 10:20am Jeffrey Holland MRN: [id number]  Principal Diagnosis: Schizophrenia spectrum disorder with psychotic disorder type not yet determined [facility])  Secondary Diagnoses: Principal Problem:   Schizophrenia spectrum disorder with psychotic disorder type not yet determined (HCC) Active Problems:   Alcohol abuse   Cannabis abuse   Cocaine abuse, episodic use (HCC)   Hepatitis C   Current Medications:  Current Facility-Administered Medications  Medication Dose Route Frequency Provider Last Rate Last Admin   acetaminophen (TYLENOL) tablet 650 mg  650 mg Oral Q6H PRN [name], PA-C   650 mg at 11/23/21 1330   albuterol (VENTOLIN HFA) 108 (90 Base) MCG/ACT inhaler 2 puff  2 puff Inhalation Q6H PRN [name], MD       alum & mag hydroxide-simeth (MAALOX/MYLANTA) 200-200-20 MG/5ML suspension 30 mL  30 mL Oral Q4H PRN [name], [name], PA-C       benztropine (COGENTIN) tablet 0.5 mg  0.5 mg Oral BID PRN [name], MD       diphenhydrAMINE (BENADRYL) capsule 50 mg  50 mg Oral Q8H PRN [name], PA-C   50 mg at 11/20/21 2223   Or   diphenhydrAMINE (BENADRYL) injection 50 mg  50 mg Intramuscular Q8H PRN [name], PA-C   50 mg at 11/19/21 1255   famotidine (PEPCID) tablet 20 mg  20 mg Oral BID [name], MD   20 mg at 11/29/21 0809   feeding supplement (ENSURE ENLIVE / ENSURE PLUS) liquid 237 mL  237 mL Oral BID BM [name], MD   237 mL at 11/28/21 1516   haloperidol (HALDOL) tablet 15 mg  15 mg Oral Q2000 [name], MD   15 mg at 11/28/21 2126   haloperidol (HALDOL) tablet 5 mg  5 mg Oral Q8H PRN [name], PA-C   5 mg at 11/18/21 2311   Or   haloperidol lactate (HALDOL) injection 5 mg  5 mg Intramuscular Q8H PRN [name], PA-C   5 mg at 11/19/21 1255   haloperidol (HALDOL) tablet 7.5 mg  7.5 mg Oral Q breakfast [name], MD   7.5 mg at 11/29/21  0810   lithium carbonate capsule 450 mg  450 mg Oral BID WC [name], MD   450 mg at 11/29/21 0809   LORazepam (ATIVAN) tablet 1 mg  1 mg Oral Q8H PRN [name], MD       Or   LORazepam (ATIVAN) injection 2 mg  2 mg Intramuscular Q8H PRN [name], MD       magnesium hydroxide (MILK OF MAGNESIA) suspension 30 mL  30 mL Oral Daily PRN [name], PA-C       mirtazapine (REMERON) tablet 7.5 mg  7.5 mg Oral QHS [name], MD   7.5 mg at 11/28/21 2126   multivitamin with minerals tablet 1 tablet  1 tablet Oral Daily [name], MD   1 tablet at 11/29/21 0809   nicotine (NICODERM CQ - dosed in mg/24 hours) patch 21 mg  21 mg Transdermal Daily Hill, [name], MD   21 mg at 11/29/21 0809   QUEtiapine (SEROQUEL) tablet 25 mg  25 mg Oral Daily PRN [name], MD   25 mg at 11/28/21 1653   QUEtiapine (SEROQUEL) tablet 50 mg  50 mg Oral QHS [name], MD   50  mg at 11/28/21 2126   thiamine tablet 100 mg  100 mg Oral Daily [name], MD   100 mg at 11/28/21 1443   PTA Medications: Medications Prior to Admission  Medication Sig Dispense Refill Last Dose   famotidine (PEPCID) 20 MG tablet Take 1 tablet (20 mg total) by mouth 2 (two) times daily. (Patient not taking: Reported on 01/06/2016) 30 tablet 0    lansoprazole (PREVACID) 30 MG capsule Take 1 capsule (30 mg total) by mouth daily at 12 noon. (Patient not taking: Reported on 08/19/2019) 30 capsule 0    ondansetron (ZOFRAN) 4 MG tablet Take 1 tablet (4 mg total) by mouth every 8 (eight) hours as needed for nausea or vomiting. (Patient not taking: Reported on 08/19/2019) 12 tablet 0    sucralfate (CARAFATE) 1 g tablet Take 1 tablet (1 g total) by mouth 3 (three) times daily as needed (epigastric pain). (Patient not taking: Reported on 08/19/2019) 30 tablet 0     Patient Stressors: Marital or family conflict   Medication change or noncompliance    Patient Strengths: Journalist, newspaper for treatment/growth   Treatment Modalities: Medication Management, Group therapy, Case management,  1 to 1 session with clinician, Psychoeducation, Recreational therapy.   Physician Treatment Plan for Primary Diagnosis: Schizophrenia spectrum disorder with psychotic disorder type not yet determined (HCC) Long Term Goal(s): Improvement in symptoms so as ready for discharge   Short Term Goals: Ability to identify changes in lifestyle to reduce recurrence of condition will improve Ability to verbalize feelings will improve Ability to disclose and discuss suicidal ideas Ability to identify triggers associated with substance abuse/mental health issues will improve  Medication Management: Evaluate patient's response, side effects, and tolerance of medication regimen.  Therapeutic Interventions: 1 to 1 sessions, Unit Group sessions and Medication administration.  Evaluation of Outcomes: Progressing  Physician Treatment Plan for Secondary Diagnosis: Principal Problem:   Schizophrenia spectrum disorder with psychotic disorder type not yet determined (HCC) Active Problems:   Alcohol abuse   Cannabis abuse   Cocaine abuse, episodic use (HCC)   Hepatitis C  Long Term Goal(s): Improvement in symptoms so as ready for discharge   Short Term Goals: Ability to identify changes in lifestyle to reduce recurrence of condition will improve Ability to verbalize feelings will improve Ability to disclose and discuss suicidal ideas Ability to identify triggers associated with substance abuse/mental health issues will improve     Medication Management: Evaluate patient's response, side effects, and tolerance of medication regimen.  Therapeutic Interventions: 1 to 1 sessions, Unit Group sessions and Medication administration.  Evaluation of Outcomes: Progressing   RN Treatment Plan for Primary Diagnosis: Schizophrenia spectrum disorder with psychotic disorder type not yet determined  (HCC) Long Term Goal(s): Knowledge of disease and therapeutic regimen to maintain health will improve  Short Term Goals: Ability to remain free from injury will improve, Ability to verbalize frustration and anger appropriately will improve, Ability to identify and develop effective coping behaviors will improve, and Compliance with prescribed medications will improve  Medication Management: RN will administer medications as ordered by provider, will assess and evaluate patient's response and provide education to patient for prescribed medication. RN will report any adverse and/or side effects to prescribing provider.  Therapeutic Interventions: 1 on 1 counseling sessions, Psychoeducation, Medication administration, Evaluate responses to treatment, Monitor vital signs and CBGs as ordered, Perform/monitor CIWA, COWS, AIMS and Fall Risk screenings as ordered, Perform wound care treatments as ordered.  Evaluation  of Outcomes: Progressing   LCSW Treatment Plan for Primary Diagnosis: Schizophrenia spectrum disorder with psychotic disorder type not yet determined (HCC) Long Term Goal(s): Safe transition to appropriate next level of care at discharge, Engage patient in therapeutic group addressing interpersonal concerns.  Short Term Goals: Engage patient in aftercare planning with referrals and resources, Increase social support, Increase ability to appropriately verbalize feelings, Identify triggers associated with mental health/substance abuse issues, and Increase skills for wellness and recovery  Therapeutic Interventions: Assess for all discharge needs, 1 to 1 time with Social worker, Explore available resources and support systems, Assess for adequacy in community support network, Educate family and significant other(s) on suicide prevention, Complete Psychosocial Assessment, Interpersonal group therapy.  Evaluation of Outcomes: Progressing   Progress in Treatment: Attending groups:  Yes. Participating in groups: Yes. Taking medication as prescribed: Yes. Toleration medication: Yes. Family/Significant other contact made: Yes, individual(s) contacted:  mother Patient understands diagnosis: Yes. Discussing patient identified problems/goals with staff: Yes. Medical problems stabilized or resolved: Yes. Denies suicidal/homicidal ideation: Yes. Issues/concerns per patient self-inventory: No. Other: None   New problem(s) identified: No, Describe:  None   New Short Term/Long Term Goal(s):medication stabilization, elimination of SI thoughts, development of comprehensive mental wellness plan.    Patient Goals:   "To stop all bad thoughts and look forward to the future"   Discharge Plan or Barriers: [facility]   Reason for Continuation of Hospitalization: Delusions  Hallucinations Medication stabilization   Estimated Length of Stay: 1-3 days   Scribe for Treatment Team: Otelia SanteeMeredith A Abriel Geesey, LCSW 11/29/2021 10:34 AM

## 2021-11-29 NOTE — Progress Notes (Signed)
Patient did not attend wrap up group. 

## 2021-11-29 NOTE — Progress Notes (Signed)
°   11/29/21 0558  Sleep  Number of Hours 8

## 2021-11-29 NOTE — Group Note (Signed)
LCSW Group Therapy Note  Group Date: 11/29/2021 Start Time: 1300 End Time: 1400   Type of Therapy and Topic:  Group Therapy - Healthy vs Unhealthy Coping Skills  Participation Level:  Active   Description of Group The focus of this group was to determine what unhealthy coping techniques typically are used by group members and what healthy coping techniques would be helpful in coping with various problems. Patients were guided in becoming aware of the differences between healthy and unhealthy coping techniques. Patients were asked to identify 2-3 healthy coping skills they would like to learn to use more effectively.  Therapeutic Goals Patients learned that coping is what human beings do all day long to deal with various situations in their lives Patients defined and discussed healthy vs unhealthy coping techniques Patients identified their preferred coping techniques and identified whether these were healthy or unhealthy Patients determined 2-3 healthy coping skills they would like to become more familiar with and use more often. Patients provided support and ideas to each other   Summary of Patient Progress:  Patient proved open to input from peers and feedback from [place]. Patient demonstrated insight into the subject matter, was respectful of peers, and participated throughout the entire session.   Therapeutic Modalities Cognitive Behavioral Therapy Motivational Interviewing  Jeffrey Holland 11/29/2021  1:19 PM

## 2021-11-29 NOTE — Group Note (Signed)
Recreation Therapy Group Note   Group Topic:Stress Management  Group Date: 11/29/2021 Start Time: 0935 End Time: 0957 Facilitators: Victorino Sparrow, LRT,CTRS Location: [address] Dayroom   Goal Area(s) Addresses:  Patient will actively participate in stress management techniques presented during session.  Patient will successfully identify benefit of practicing stress management post d/c.    Group Description: Guided Imagery. LRT provided education, instruction, and demonstration on practice of visualization via guided imagery. Patient was asked to participate in the technique introduced during session. LRT debriefed including topics of mindfulness, stress management and specific scenarios each patient could use these techniques. Patients were given suggestions of ways to access scripts post d/c and encouraged to explore Youtube and other apps available on smartphones, tablets, and computers.   Affect/Mood: N/A   Participation Level: Did not attend    Clinical Observations/Individualized Feedback:     Plan: Continue to engage patient in RT group sessions 2-3x/week.   Victorino Sparrow, LRT,CTRS 11/29/2021 11:49 AM

## 2021-11-29 NOTE — Progress Notes (Signed)
D- Patient alert and oriented x4. Patient in stable mood.  Denies SI, HI, AVH. Pt confirmed he was hearing "low " voices in his head earlier but after he was given PRN meds, he stopped hearing it. Pt was drowsy when getting his meds. Pt stated he was tired.    A- Scheduled medications administered to patient, per MD orders.Routine safety checks conducted every 15 minutes.  Patient informed to notify staff with problems or concerns.   R- Patient compliant with medications and verbalized understanding treatment plan. Patient receptive, calm, and cooperative.    11/28/21 2125  Psych Admission Type (Psych Patients Only)  Admission Status Involuntary  Psychosocial Assessment  Patient Complaints None  Eye Contact Fair  Facial Expression Animated;Worried  Affect Anxious  Lobbyist Cooperative;Appropriate to situation  Mood Anxious;Pleasant  Thought Process  Coherency Circumstantial  Content Blaming self  Delusions None reported or observed  Perception WDL  Hallucination None reported or observed  Judgment Impaired  Confusion Mild  Danger to Self  Current suicidal ideation? Denies  Danger to Others  Danger to Others None reported or observed

## 2021-11-29 NOTE — Progress Notes (Signed)
D:  Patient's self inventory sheet, patient has fair sleep, no sleep medication.  Good appetite, normal energy level, good concentration.  Rated depression 1, denied hopeless, anxiety 5.  Denied withdrawals.  Denied SI.   Denied physical problems.  Denied physical pain.  Goal is return to work.  Plans to discharge.  No discharge plans. A:  Medications administered per MD orders.  Emotional support and encouragement given patient. R:  Denied SI and HI, contracts for safety.  Denied A/V hallucinations.  Safety maintained with 15 minute checks.

## 2021-11-29 NOTE — Plan of Care (Signed)
Nurse discussed anxiety and coping skills with patient. 

## 2021-11-29 NOTE — BHH Group Notes (Signed)
Pt did not attend group. 

## 2021-11-29 NOTE — Progress Notes (Addendum)
[facility] MD Progress Note  11/29/2021 3:58 PM Jeffrey Holland  MRN:  [id number] Subjective:  Jeffrey Holland is a 36 y.o. male with no known past psychiatric history, who was initially admitted for inpatient psychiatric hospitalization on 11/15/2021 for management of hallucinations and delusions.     Case was discussed in the multidisciplinary team. MAR was reviewed and patient was compliant with medications. He did not require PRN medications for agitation. Patient did receive a PRN Seroquel after telling RN he was hearing low voices. Per RN patient had resolution of symptoms after receiving the medication.    Psychiatric Team made the following recommendations yesterday:  -Continue Haldol 7.48m daily and 15 mg nightly for residual hallucinations -Increase Seroquel to 50 mg QHS and continue Seroquel 278mbid PRN for anxiety - Continue Remeron 7.43m32mor depressed mood -  Continue Cogentin 0.43mg67md PRN EPS - Continue Lithium to 450mg51m             - Lithium lvl 1/15: 0.30 - Repeat Li lvl, CBC, TSH, and BMP tom AM - Agitation Protocol: Haldol 43mg I59mr po q8h PRN, Ativan 1mg po83m 2mg IM 36m hours PRN, Benadryl 50mg IM 2mo q8 hours PRN  On assessment today patient reports that he is "tired" and endorses he is irritable because he is tired and wants to go home. Patient reports that he feels like he is in prison again. Patient reports that it is difficult for him to sleep on the mattress provided. Patient reports that he is sleeping but also says he is not "resting." Patient reports that he feels like he will never get to go home and asks providers if they are reading his mind. Patient reports `he feels like "it's a mindgame." Patient denies that he has been having VH and endorses that since receiving his QHS Seroquel he is not having AH. Patient denies feeling that his thoughts are loud enough for others to hear and also denies that he is having racing thoughts. Patient denies believing he is  psychic. Patient denies SI and HI as well. He denies ideas of reference. He voices no medication side-effects or physical complaints.  Spoke w/ mom: Mom reports that she is seeing a little change from where patient was 2 weeks ago. Mom reports that the patient appears to be concerned that he may be going to prison. Patient also asked mom about what is in the background. Mom reports that she has to reassure patients. Mom reports that patient "snaps" when he finds that out he is not going home. Mom reports that patient incessantly asks about going home.   Principal Problem: Schizophrenia spectrum disorder with psychotic disorder type not yet determined (HCC) [place]: Principal Problem:   Schizophrenia spectrum disorder with psychotic disorder type not yet determined (HCC) [place]blems:   Alcohol abuse   Cannabis abuse   Cocaine abuse, episodic use (HCC)   He[place] Chase[place] C  Total Time spent with patient: I personally spent 25 minutes on the unit in direct patient care. The direct patient care time included face-to-face time with the patient, reviewing the patient's chart, communicating with other professionals, and coordinating care. Greater than 50% of this time was spent in counseling or coordinating care with the patient regarding goals of hospitalization, psycho-education, and discharge planning needs.    Past Psychiatric History:  See H&P  Past Medical History:  Past Medical History:  Diagnosis Date   Anxiety    Asthma    Panic  Family History: see H&P  Family Psychiatric  History:  See H&P  Social History:  Social History   Substance and Sexual Activity  Alcohol Use Yes     Social History   Substance and Sexual Activity  Drug Use Yes   Types: Marijuana, Methamphetamines, Cocaine    Social History   Socioeconomic History   Marital status: Single    Spouse name: Not on file   Number of children: Not on file   Years of education: Not on file   Highest education  level: Not on file  Occupational History   Not on file  Tobacco Use   Smoking status: Every Day    Packs/day: 1.00    Types: Cigarettes   Smokeless tobacco: Not on file  Vaping Use   Vaping Use: Every day  Substance and Sexual Activity   Alcohol use: Yes   Drug use: Yes    Types: Marijuana, Methamphetamines, Cocaine   Sexual activity: Yes    Birth control/protection: None  Other Topics Concern   Not on file  Social History Narrative   ** Merged History Encounter **       Social Determinants of Health   Financial Resource Strain: Not on file  Food Insecurity: Not on file  Transportation Needs: Not on file  Physical Activity: Not on file  Stress: Not on file  Social Connections: Not on file   Sleep: Fair per patient report  Appetite:  Good  Current Medications: Current Facility-Administered Medications  Medication Dose Route Frequency Provider Last Rate Last Admin   acetaminophen (TYLENOL) tablet 650 mg  650 mg Oral Q6H PRN Margorie John W, PA-C   650 mg at 11/23/21 1330   albuterol (VENTOLIN HFA) 108 (90 Base) MCG/ACT inhaler 2 puff  2 puff Inhalation Q6H PRN Harlow Asa, MD       alum & mag hydroxide-simeth (MAALOX/MYLANTA) 200-200-20 MG/5ML suspension 30 mL  30 mL Oral Q4H PRN Lovena Le, [name], PA-C       benztropine (COGENTIN) tablet 0.5 mg  0.5 mg Oral BID PRN Nelda Marseille, [place] Straus E, MD       diphenhydrAMINE (BENADRYL) capsule 50 mg  50 mg Oral Q8H PRN Margorie John W, PA-C   50 mg at 11/20/21 2223   Or   diphenhydrAMINE (BENADRYL) injection 50 mg  50 mg Intramuscular Q8H PRN Margorie John W, PA-C   50 mg at 11/19/21 1255   famotidine (PEPCID) tablet 20 mg  20 mg Oral BID Damita Dunnings B, MD   20 mg at 11/29/21 0809   feeding supplement (ENSURE ENLIVE / ENSURE PLUS) liquid 237 mL  237 mL Oral BID BM McQuilla, Jai B, MD   237 mL at 11/29/21 1051   [START ON 11/30/2021] haloperidol (HALDOL) tablet 10 mg  10 mg Oral QHS Dutchess Crosland E, MD       haloperidol (HALDOL) tablet  15 mg  15 mg Oral QHS Teddy Pena E, MD       haloperidol (HALDOL) tablet 5 mg  5 mg Oral Q8H PRN Margorie John W, PA-C   5 mg at 11/18/21 2311   Or   haloperidol lactate (HALDOL) injection 5 mg  5 mg Intramuscular Q8H PRN Margorie John W, PA-C   5 mg at 11/19/21 1255   [START ON 11/30/2021] haloperidol (HALDOL) tablet 5 mg  5 mg Oral BID Viann Fish E, MD       lithium carbonate capsule 450 mg  450 mg Oral BID WC McQuilla,  Mila Palmer, MD   450 mg at 11/29/21 0809   LORazepam (ATIVAN) tablet 1 mg  1 mg Oral Q8H PRN Freida Busman, MD       Or   LORazepam (ATIVAN) injection 2 mg  2 mg Intramuscular Q8H PRN Damita Dunnings B, MD       magnesium hydroxide (MILK OF MAGNESIA) suspension 30 mL  30 mL Oral Daily PRN Lovena Le, [name], PA-C       multivitamin with minerals tablet 1 tablet  1 tablet Oral Daily Viann Fish E, MD   1 tablet at 11/29/21 0809   nicotine (NICODERM CQ - dosed in mg/24 hours) patch 21 mg  21 mg Transdermal Daily Hill, Jackie Plum, MD   21 mg at 11/29/21 0809   QUEtiapine (SEROQUEL) tablet 25 mg  25 mg Oral BID Viann Fish E, MD   25 mg at 11/29/21 1253   QUEtiapine (SEROQUEL) tablet 50 mg  50 mg Oral QHS Damita Dunnings B, MD   50 mg at 11/28/21 2126   thiamine tablet 100 mg  100 mg Oral Daily Nelda Marseille, Isbella Arline E, MD   100 mg at 11/29/21 0900    Lab Results:  Results for orders placed or performed during the hospital encounter of 11/15/21 (from the past 48 hour(s))  Lithium level     Status: Abnormal   Collection Time: 11/29/21  6:22 AM  Result Value Ref Range   Lithium Lvl 0.25 (L) 0.60 - 1.20 mmol/L    Comment: Performed at [facility], [place] [address], [place], [place] [address]  CBC with Differential/Platelet     Status: Abnormal   Collection Time: 11/29/21  6:22 AM  Result Value Ref Range   WBC 10.9 (H) 4.0 - 10.5 K/uL   RBC 4.74 4.22 - 5.81 MIL/uL   Hemoglobin 16.0 13.0 - 17.0 g/dL   HCT 48.3 39.0 - 52.0 %   MCV 101.9 (H) 80.0 - 100.0 fL   MCH  33.8 26.0 - 34.0 pg   MCHC 33.1 30.0 - 36.0 g/dL   RDW 12.6 11.5 - 15.5 %   Platelets 226 150 - 400 K/uL   nRBC 0.0 0.0 - 0.2 %   Neutrophils Relative % 50 %   Neutro Abs 5.5 1.7 - 7.7 K/uL   Lymphocytes Relative 33 %   Lymphs Abs 3.6 0.7 - 4.0 K/uL   Monocytes Relative 11 %   Monocytes Absolute 1.2 (H) 0.1 - 1.0 K/uL   Eosinophils Relative 4 %   Eosinophils Absolute 0.5 0.0 - 0.5 K/uL   Basophils Relative 1 %   Basophils Absolute 0.1 0.0 - 0.1 K/uL   Immature Granulocytes 1 %   Abs Immature Granulocytes 0.14 (H) 0.00 - 0.07 K/uL    Comment: Performed at [facility], [place] [address], [place], [place] [address]  Basic metabolic panel     Status: None   Collection Time: 11/29/21  6:22 AM  Result Value Ref Range   Sodium 137 135 - 145 mmol/L   Potassium 4.2 3.5 - 5.1 mmol/L   Chloride 106 98 - 111 mmol/L   CO2 25 22 - 32 mmol/L   Glucose, Bld 98 70 - 99 mg/dL    Comment: Glucose reference range applies only to samples taken after fasting for at least 8 hours.   BUN 17 6 - 20 mg/dL   Creatinine, Ser 0.76 0.61 - 1.24 mg/dL   Calcium 9.0 8.9 - 10.3 mg/dL   GFR,  Estimated >60 >60 mL/min    Comment: (NOTE) Calculated using the CKD-EPI Creatinine Equation (2021)    Anion gap 6 5 - 15    Comment: Performed at [facility], [place] [address] [place], [place] [address]  TSH     Status: None   Collection Time: 11/29/21  6:22 AM  Result Value Ref Range   TSH 2.739 0.350 - 4.500 uIU/mL    Comment: Performed by a 3rd Generation assay with a functional sensitivity of <=0.01 uIU/mL. Performed at [facility], [place] [address], [place], [place] [address]     Blood Alcohol level:  Lab Results  Component Value Date   ETH <10 11/14/2021   ETH <10 44/81/8563    Metabolic Disorder Labs: Lab Results  Component Value Date   HGBA1C 5.5 11/15/2021   MPG 111.15 11/15/2021   No results found for: PROLACTIN Lab Results  Component Value  Date   CHOL 151 11/15/2021   TRIG 98 11/15/2021   HDL 42 11/15/2021   CHOLHDL 3.6 11/15/2021   VLDL 20 11/15/2021   LDLCALC 89 11/15/2021    Physical Findings: AIMS: Facial and Oral Movements Muscles of Facial Expression: None, normal Lips and Perioral Area: None, normal Jaw: None, normal Tongue: None, normal,Extremity Movements Upper (arms, wrists, hands, fingers): None, normal Lower (legs, knees, ankles, toes): None, normal, Trunk Movements Neck, shoulders, hips: None, normal, Overall Severity Severity of abnormal movements (highest score from questions above): None, normal Incapacitation due to abnormal movements: None, normal Patient's awareness of abnormal movements (rate only patient's report): No Awareness, Dental Status Current problems with teeth and/or dentures?: No Does patient usually wear dentures?: No  CIWA:  CIWA-Ar Total: 0  Musculoskeletal: Strength & Muscle Tone: within normal limits Gait & Station: normal Patient leans: N/A  Psychiatric Specialty Exam:  Presentation  General Appearance: Appropriate for Environment, casually dressed, fair hygiene   Eye Contact:Fair   Speech:Clear and Coherent   Speech Volume:Increased   Handedness:Right     Mood and Affect  Mood: "tired" appears irritable about his hospitalizations   Affect:irritable and labile     Thought Process  Thought Processes:concrete, ruminative about ongoing admission   Descriptions of Associations:intact   Orientation:Full (Time, Place and Person)   Thought Content:Denies AVH, endorses paranoia towards providers with delusion that they can read his mind ; is not grossly responding to internal/external stimuli on exam; denies ideas of reference   History of Schizophrenia/Schizoaffective disorder:No   Duration of Psychotic Symptoms:Less than six months   Hallucinations:Hallucinations: None   Ideas of Reference:None   Suicidal Thoughts:Suicidal Thoughts: No   Homicidal  Thoughts:Homicidal Thoughts: No     Sensorium  Memory:Immediate Fair; Recent Fair   Judgment:- Fair - compliant with meds   Insight:- Shallow   Executive Functions  Concentration:Fair   Attention Span:Fair   [place] of Knowledge:Fair   Language:Fair     Psychomotor Activity  Psychomotor Activity:Normal      Assets  Assets:Communication Skills; Resilience     Sleep  8 hours recorded  Physical Exam Constitutional:      Appearance: Normal appearance.  HENT:     Head: Normocephalic and atraumatic.  Pulmonary:     Effort: Pulmonary effort is normal.  Neurological:     Mental Status: He is alert.   Review of Systems  Respiratory:  Negative for shortness of breath.   Cardiovascular:  Negative for chest pain.  Gastrointestinal:  Negative for diarrhea, nausea and vomiting.  Psychiatric/Behavioral:  Negative for hallucinations and suicidal ideas.   Blood pressure 105/80, pulse 89, temperature 98 F (36.7 C), temperature source Oral, resp. rate 16, height 5' 6"  (1.676 m), weight 62.6 kg, SpO2 96 %. Body mass index is 22.27 kg/m.   Treatment Plan Summary: Daily contact with patient to assess and evaluate symptoms and progress in treatment and Medication management  Kenith Trickel is a 36 yo patient w/ no known PPH who presented endorsing AVH, delusions, and depressed mood. Patient was more irritable today and also appeared more labile in his emotions however he was angry and remorseful rather than sad and remorseful as he was prior to [place]. Patient's Li level was subtherapuetic despite patient being on 4 days of 465m BID in addition to the titration up. Patient is adamant he is not cheeking medications and there has been no concern of this from RN. Patient appears to be clearing the medication very well and may need an increase in [place]onydosage. Based on patient's presentation and collateral from mom patient also appears to have some residual paranoia and could  benefit from adjusting his antipsychotics. Patient is endorsing feeling "tired" therefore will stop Remeron as it can be sedating and is likely minimally beneficial out of all of patient's other sedating medications.   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 Psychiatric Diagnoses and Treatment:              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 (r/o Substance induced psychosis vs MDD, recurrent, severe w/ psychotic features vs Schizophreniform disorder vs Bipolar disorder, current episode depressed w/ psychotic features vs psychosis secondary to general medical condition) -Change Haldol to  534mbid and 108mhs for residual paranoia -Change Seroquel to 98m51mD and  50 mg QHS for residual paranoia - Discontinue Remeron 7.5mg 24m depressed mood due to concern for possible oversedation on other meds -  Continue Cogentin 0.5mg b68mPRN EPS - Increase  Lithium to 600mg B36m           - Lithium lvl 1/15: 0.30 - 11/29/21  Li lvl-,0.25 CBC- WBC 10.9, TSH- WNL, and BMP - WNL (creatinine 0.76) - Repeat Li lvl 1/23 along with BMP and TSH - Agitation Protocol: Haldol 5mg IM 6mpo q8h PRN, Ativan 1mg po o58mmg IM q 67mours PRN, Benadryl 50mg IM or63mq8 hours PRN -- New onset psychosis w/u: Negative noncontrast head CT on 11/13/2021, Hep C Ab positive; ESR 2,  ceruloplasmin 20.5, HIV nonreactive, B12 348, RPR nonreactive, ANA negative, TSH 0.667,  UDS positive for THC, alcohol less than 10, BMP within normal limits other than a glucose of 123, white count 13.1 trending down to 10.2, AST elevated at 62 and ALT elevated at 103, B1 125.3, Heavy metals negative --Antipsychotic lab monitoring: EKG: QTC 441m on 11/28/21, A1c 5.5, Lipids WNL             -- Encouraged patient to participate in unit milieu and in scheduled group therapies                EtoH use (r/o alcohol use d/o) Cocaine use - episodic (r/o stimulant use d/o) 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 Medical Issues Being Addressed:              Tobacco Use Disorder             -- Nicotine patch 28m24 hours ordered, scheduled             -- Smoking cessation encouraged               Hx of Gastric Ulcers -- Continue home Pepcid 2069mID   Hep C ab positive/Transaminitis -- Hep C RNA Quant lab-353,000 - Will need f/u OP -- AST trending up to 62> 94 and ALT up to 103> 109 (has been at AST 155 and ALT 228 2 years ago) with T bili 1.4 > 0.9 and indirect bili 1.3 > 0.8 - Previously Consulted GI, Dr. OutPaulita Fujitaecommends RUQ US [place] if this is negative patient can be seen OP for Hep C mgmt, and his dx is likely Chronic Hep C.  - RUQ US [place] see reading, recommend patient f/u OP as there is nothing acutely concerning, SW to schedule PCP   Leukocytosis  -- WBC on 11/29/21 10.9  - questionably elevated with Li Nicoletta Dresse   4. Discharge Planning:              -- Social work and case management to assist with discharge planning and identification of hospital follow-up needs prior to discharge             -- Discharge Concerns: Need to establish a safety plan; Medication compliance and effectiveness             -- Discharge Goals: Return home with outpatient referrals for  mental health follow-up including medication management/psychotherapy    PGY-2 JaiFreida BusmanD 11/29/2021, 3:58 PM

## 2021-11-29 NOTE — BHH Group Notes (Signed)
Patients were asked to read '' There's a hole in my sidewalk '' by Franky Macho. They were asked to identify negative behavioral patterns as it pertains to mental health and contributing factors to mental health outcomes. Pt attended group, participation was minimal - pt came late and would not speak when called upon.

## 2021-11-30 NOTE — Progress Notes (Signed)
D:  Jeffrey Holland was highly irritable during initial assessment.  He became loud and argumentative.  Blaming [facility] for losing his job and is going to seek a Clinical research associate.  "I am fed up with this place and I want to leave now.  I don't know why I am still here and you guys are keeping me for no reason."  He would not answer questions when asked.  He remained in his room after assessment.  He did come out for snack and to obtain bedtime medications.  At that time, he was pleasant and apologized for his earlier behavior.  He took his hs medications without difficulty and no pills left in his mouth upon check.   A:  1:1 with RN for support and encouragement.  Medications given as ordered.  Q 15 minute checks maintained for safety.  Encouraged participation in group and unit activities.   R:  He is currently resting with his eyes closed and appears to be asleep.  He remains safe on the unit.  We will continue to monitor the progress towards his goals.

## 2021-11-30 NOTE — Progress Notes (Signed)
Psychoeducational Group Note  Date:  11/30/2021 Time:  2220  Group Topic/Focus:  Wrap-Up Group:   The focus of this group is to help patients review their daily goal of treatment and discuss progress on daily workbooks.  Participation Level: Did Not Attend  Participation Quality:  Not Applicable  Affect:  Not Applicable  Cognitive:  Not Applicable  Insight:  Not Applicable  Engagement in Group: Not Applicable  Additional Comments:  The patient did not attend group this evening.   Ndia Sampath S 11/30/2021, 10:20 PM

## 2021-11-30 NOTE — Group Note (Unsigned)
Date:  11/30/2021 °Time:  2:04 PM ° °Group Topic/Focus:  °Orientation:   The focus of this group is to educate the patient on the purpose and policies of crisis stabilization and provide a format to answer questions about their admission.  The group details unit policies and expectations of patients while admitted. ° ° ° ° °Participation Level:  {[facility] PARTICIPATION LEVEL:22264} ° °Participation Quality:  {[facility] PARTICIPATION QUALITY:22265} ° °Affect:  {[facility] AFFECT:22266} ° °Cognitive:  {[facility] COGNITIVE:22267} ° °Insight: {BHH Insight2:20797} ° °Engagement in Group:  {[facility] ENGAGEMENT IN GROUP:22268} ° °Modes of Intervention:  {B[facility] MODES OF INTERVENTION:22269} ° °Additional Comments:  *** ° °Lanetta Figuero D Nadyne Gariepy °11/30/2021, 2:04 PM ° °

## 2021-11-30 NOTE — Group Note (Unsigned)
Date:  11/30/2021 °Time:  1:49 PM ° °Group Topic/Focus:  °Orientation:   The focus of this group is to educate the patient on the purpose and policies of crisis stabilization and provide a format to answer questions about their admission.  The group details unit policies and expectations of patients while admitted. ° ° ° ° °Participation Level:  {BHH PARTICIPATION LEVEL:22264} ° °Participation Quality:  {B[facility] PARTICIPATION QUALITY:22265} ° °Affect:  {BHH AFFECT:22266} ° °Cognitive:  {BHH COGNITIVE:22267} ° °Insight: {BHH Insight2:20797} ° °Engagement in Group:  {[facility] ENGAGEMENT IN GROUP:22268} ° °Modes of Intervention:  {[facility] MODES OF INTERVENTION:22269} ° °Additional Comments:  *** ° °Tallen Schnorr D Rhone Ozaki °11/30/2021, 1:49 PM ° °

## 2021-11-30 NOTE — Progress Notes (Signed)
D- Patient alert and oriented x4. Patient in stable mood.  Denies SI, HI, AVH. Pt interacting positively with staff and peers. Pt very pleasant but animated. Pt stated his only concern is seeing black dots in his vision. Pt took bedtime meds with no issue. However, when patient placed meds in his month, he was asked if he swallowed and he stated he did. Then when RN asked patient to open his month to look into it, pills were on top of his tongue. Pt stated he thought he swallowed meds. He took one more sip of water and then completely swallowed meds.    A- Scheduled medications administered to patient, per MD orders.Routine safety checks conducted every 15 minutes.  Patient informed to notify staff with problems or concerns.   R- Patient compliant with medications and verbalized understanding treatment plan. Patient receptive, calm, and cooperative.    11/29/21 2120  Psych Admission Type (Psych Patients Only)  Admission Status Involuntary  Psychosocial Assessment  Patient Complaints None  Eye Contact Fair  Facial Expression Animated  Affect Appropriate to circumstance  Speech Logical/coherent  Interaction Guarded  Motor Activity Fidgety  Appearance/Hygiene Unremarkable  Behavior Characteristics Cooperative;Appropriate to situation  Mood Pleasant  Thought Process  Coherency Circumstantial  Content Blaming self  Delusions None reported or observed  Perception WDL  Hallucination None reported or observed  Judgment Impaired  Confusion WDL  Danger to Self  Current suicidal ideation? Denies  Danger to Others  Danger to Others None reported or observed

## 2021-11-30 NOTE — Progress Notes (Signed)
Pt denies SI/HI/AVH and verbally agrees to approach staff if these become apparent or before harming themselves/others. Rates depression 0/10. Rates anxiety 0/10. Rates pain 0/10.  Pt has been in room for the majority of the day and stated the reason is, "I am just chilling to I leave." Scheduled medications administered to pt, per MD orders. RN provided support and encouragement to pt. Q15 min safety checks implemented and continued. Pt safe on the unit. RN will continue to monitor and intervene as needed.   11/30/21 0802  Psych Admission Type (Psych Patients Only)  Admission Status Involuntary  Psychosocial Assessment  Patient Complaints None  Eye Contact Fair  Facial Expression Animated  Affect Appropriate to circumstance  Speech Logical/coherent  Interaction Guarded  Motor Activity Fidgety  Appearance/Hygiene Unremarkable  Behavior Characteristics Cooperative;Appropriate to situation;Calm  Mood Pleasant  Aggressive Behavior  Effect No apparent injury  Thought Process  Coherency Circumstantial  Content Blaming self  Delusions None reported or observed  Perception WDL  Hallucination None reported or observed  Judgment Impaired  Confusion None  Danger to Self  Current suicidal ideation? Denies  Danger to Others  Danger to Others None reported or observed

## 2021-11-30 NOTE — Progress Notes (Signed)
°   11/30/21 0554  Sleep  Number of Hours 6.75

## 2021-11-30 NOTE — Progress Notes (Addendum)
[facility] MD Progress Note  11/30/2021 3:24 PM Jeffrey Holland  MRN:  [id number] Subjective:  Jeffrey Holland is a 36 y.o. male with no known past psychiatric history, who was initially admitted for inpatient psychiatric hospitalization on 11/15/2021 for management of hallucinations and delusions.     Case was discussed in the multidisciplinary team. MAR was reviewed and patient was compliant with medications. He did not require PRN medications for agitation or AH.    Psychiatric Team made the following recommendations yesterday:  -Change Haldol to  102m bid and 18mqhs for residual paranoia -Change Seroquel to 2532mID and  50 mg QHS for residual paranoia - Discontinue Remeron 7.5mg82mr depressed mood due to concern for possible oversedation on other meds -  Continue Cogentin 0.5mg 33m PRN EPS - Increase  Lithium to 600mg 76m            - Lithium lvl 1/15: 0.30 - 11/29/21  Li lvl-,0.25 CBC- WBC 10.9, TSH- WNL, and BMP - WNL (creatinine 0.76) - Repeat Li lvl 1/23 along with BMP and TSH - Agitation Protocol: Haldol 5mg IM73m po q8h PRN, Ativan 1mg po 60m2mg IM q79mhours PRN, Benadryl 50mg IM o68m q8 hours PRN  On assessment today patient reports he is "P-I-S-S- E- D OFF." Patient continues to endorse that he is feeling irritable.Patient reports that he wants to leave as he feels that he is in jail. Patient reports that he is going to every group, but is then coming back to his room looking at the ceiling and he feels bored. Providers discuss with patient that he needs to be able to less isolative without prompting and show that he is not as labile. Patient endorsed understanding after repeatedly questioning his hospitalization. Patient reports that he does have trouble being honest about what he is thinking and feeling and is anxious to talk around others. Patient endorses he will try to work on this and is regretful for initial agitated behavior today during interview.Otherwise patient reports that  he slept some last night but continues to have strange dreams and nightmares, that leave him not feeling well rested. He reports great appetite. Patient denies AVH but does endorse a floater in one of his eyes. Patient denies SI and HI. He denies thought broadcasting/insertion/withdrawal but admits to residual concern that the team can read his mind and that he may be getting personalized messages when he watches the weather girl on TV. He is able to reality test today when confronted with the fact that these are delusional beliefs. He reports some loose stools related to drinking Ensure but voices no other physical complaints or medication side-effects.   Principal Problem: Schizophrenia spectrum disorder with psychotic disorder type not yet determined (HCC) DiagnDollar Bays: Principal Problem:   Schizophrenia spectrum disorder with psychotic disorder type not yet determined (HCC) ActivAugustaroblems:   Alcohol abuse   Cannabis abuse   Cocaine abuse, episodic use (HCC)   HepClaytis C  Total Time spent with patient: I personally spent 30 minutes on the unit in direct patient care. The direct patient care time included face-to-face time with the patient, reviewing the patient's chart, communicating with other professionals, and coordinating care. Greater than 50% of this time was spent in counseling or coordinating care with the patient regarding goals of hospitalization, psycho-education, and discharge planning needs.  Past Psychiatric History:  See H&P  Past Medical History:  Past Medical History:  Diagnosis Date   Anxiety  Asthma    Panic    Family History: see H&P  Family Psychiatric  History: See H&P  Social History:  Social History   Substance and Sexual Activity  Alcohol Use Yes     Social History   Substance and Sexual Activity  Drug Use Yes   Types: Marijuana, Methamphetamines, Cocaine    Social History   Socioeconomic History   Marital status: Single    Spouse name: Not on  file   Number of children: Not on file   Years of education: Not on file   Highest education level: Not on file  Occupational History   Not on file  Tobacco Use   Smoking status: Every Day    Packs/day: 1.00    Types: Cigarettes   Smokeless tobacco: Not on file  Vaping Use   Vaping Use: Every day  Substance and Sexual Activity   Alcohol use: Yes   Drug use: Yes    Types: Marijuana, Methamphetamines, Cocaine   Sexual activity: Yes    Birth control/protection: None  Other Topics Concern   Not on file  Social History Narrative   ** Merged History Encounter **       Social Determinants of Health   Financial Resource Strain: Not on file  Food Insecurity: Not on file  Transportation Needs: Not on file  Physical Activity: Not on file  Stress: Not on file  Social Connections: Not on file    Sleep: Fair  Appetite:  Good  Current Medications: Current Facility-Administered Medications  Medication Dose Route Frequency Provider Last Rate Last Admin   acetaminophen (TYLENOL) tablet 650 mg  650 mg Oral Q6H PRN Margorie John W, PA-C   650 mg at 11/23/21 1330   albuterol (VENTOLIN HFA) 108 (90 Base) MCG/ACT inhaler 2 puff  2 puff Inhalation Q6H PRN Harlow Asa, MD       alum & mag hydroxide-simeth (MAALOX/MYLANTA) 200-200-20 MG/5ML suspension 30 mL  30 mL Oral Q4H PRN Lovena Le, [name], PA-C       benztropine (COGENTIN) tablet 0.5 mg  0.5 mg Oral BID PRN Nelda Marseille, Breiana Stratmann E, MD       diphenhydrAMINE (BENADRYL) capsule 50 mg  50 mg Oral Q8H PRN Margorie John W, PA-C   50 mg at 11/20/21 2223   Or   diphenhydrAMINE (BENADRYL) injection 50 mg  50 mg Intramuscular Q8H PRN Margorie John W, PA-C   50 mg at 11/19/21 1255   famotidine (PEPCID) tablet 20 mg  20 mg Oral BID Damita Dunnings B, MD   20 mg at 11/30/21 0802   feeding supplement (ENSURE ENLIVE / ENSURE PLUS) liquid 237 mL  237 mL Oral BID BM McQuilla, Jai B, MD   237 mL at 11/30/21 1421   haloperidol (HALDOL) tablet 10 mg  10 mg Oral QHS  Rosalyn Archambault E, MD       haloperidol (HALDOL) tablet 5 mg  5 mg Oral Q8H PRN Margorie John W, PA-C   5 mg at 11/18/21 2311   Or   haloperidol lactate (HALDOL) injection 5 mg  5 mg Intramuscular Q8H PRN Margorie John W, PA-C   5 mg at 11/19/21 1255   haloperidol (HALDOL) tablet 5 mg  5 mg Oral BID Viann Fish E, MD   5 mg at 11/30/21 0802   lithium carbonate capsule 600 mg  600 mg Oral BID WC Damita Dunnings B, MD   600 mg at 11/30/21 0802   LORazepam (ATIVAN) tablet 1 mg  1 mg Oral Q8H PRN Freida Busman, MD       Or   LORazepam (ATIVAN) injection 2 mg  2 mg Intramuscular Q8H PRN Damita Dunnings B, MD       magnesium hydroxide (MILK OF MAGNESIA) suspension 30 mL  30 mL Oral Daily PRN Lovena Le, [name], PA-C       multivitamin with minerals tablet 1 tablet  1 tablet Oral Daily Harlow Asa, MD   1 tablet at 11/30/21 9371   nicotine (NICODERM CQ - dosed in mg/24 hours) patch 21 mg  21 mg Transdermal Daily Hill, Jackie Plum, MD   21 mg at 11/30/21 0803   QUEtiapine (SEROQUEL) tablet 25 mg  25 mg Oral BID Viann Fish E, MD   25 mg at 11/30/21 1204   QUEtiapine (SEROQUEL) tablet 50 mg  50 mg Oral QHS Damita Dunnings B, MD   50 mg at 11/29/21 2119   thiamine tablet 100 mg  100 mg Oral Daily Harlow Asa, MD   100 mg at 11/30/21 6967    Lab Results:  Results for orders placed or performed during the hospital encounter of 11/15/21 (from the past 48 hour(s))  Lithium level     Status: Abnormal   Collection Time: 11/29/21  6:22 AM  Result Value Ref Range   Lithium Lvl 0.25 (L) 0.60 - 1.20 mmol/L    Comment: Performed at [facility], [place] [address], [place], [place] [address]  CBC with Differential/Platelet     Status: Abnormal   Collection Time: 11/29/21  6:22 AM  Result Value Ref Range   WBC 10.9 (H) 4.0 - 10.5 K/uL   RBC 4.74 4.22 - 5.81 MIL/uL   Hemoglobin 16.0 13.0 - 17.0 g/dL   HCT 48.3 39.0 - 52.0 %   MCV 101.9 (H) 80.0 - 100.0 fL   MCH 33.8 26.0 - 34.0 pg    MCHC 33.1 30.0 - 36.0 g/dL   RDW 12.6 11.5 - 15.5 %   Platelets 226 150 - 400 K/uL   nRBC 0.0 0.0 - 0.2 %   Neutrophils Relative % 50 %   Neutro Abs 5.5 1.7 - 7.7 K/uL   Lymphocytes Relative 33 %   Lymphs Abs 3.6 0.7 - 4.0 K/uL   Monocytes Relative 11 %   Monocytes Absolute 1.2 (H) 0.1 - 1.0 K/uL   Eosinophils Relative 4 %   Eosinophils Absolute 0.5 0.0 - 0.5 K/uL   Basophils Relative 1 %   Basophils Absolute 0.1 0.0 - 0.1 K/uL   Immature Granulocytes 1 %   Abs Immature Granulocytes 0.14 (H) 0.00 - 0.07 K/uL    Comment: Performed at [facility], [place] [address], [place], [place] [address]  Basic metabolic panel     Status: None   Collection Time: 11/29/21  6:22 AM  Result Value Ref Range   Sodium 137 135 - 145 mmol/L   Potassium 4.2 3.5 - 5.1 mmol/L   Chloride 106 98 - 111 mmol/L   CO2 25 22 - 32 mmol/L   Glucose, Bld 98 70 - 99 mg/dL    Comment: Glucose reference range applies only to samples taken after fasting for at least 8 hours.   BUN 17 6 - 20 mg/dL   Creatinine, Ser 0.76 0.61 - 1.24 mg/dL   Calcium 9.0 8.9 - 10.3 mg/dL   GFR, Estimated >60 >60 mL/min    Comment: (NOTE) Calculated using the CKD-EPI Creatinine Equation (2021)  Anion gap 6 5 - 15    Comment: Performed at [facility], [place] [address], [place], [place] [address]  TSH     Status: None   Collection Time: 11/29/21  6:22 AM  Result Value Ref Range   TSH 2.739 0.350 - 4.500 uIU/mL    Comment: Performed by a 3rd Generation assay with a functional sensitivity of <=0.01 uIU/mL. Performed at [facility], [place] [address], [place], [place] [address]     Blood Alcohol level:  Lab Results  Component Value Date   ETH <10 11/14/2021   ETH <10 46/56/8127    Metabolic Disorder Labs: Lab Results  Component Value Date   HGBA1C 5.5 11/15/2021   MPG 111.15 11/15/2021   No results found for: PROLACTIN Lab Results  Component Value Date   CHOL 151  11/15/2021   TRIG 98 11/15/2021   HDL 42 11/15/2021   CHOLHDL 3.6 11/15/2021   VLDL 20 11/15/2021   LDLCALC 89 11/15/2021    Physical Findings: AIMS: Facial and Oral Movements Muscles of Facial Expression: None, normal Lips and Perioral Area: None, normal Jaw: None, normal Tongue: None, normal,Extremity Movements Upper (arms, wrists, hands, fingers): None, normal Lower (legs, knees, ankles, toes): None, normal, Trunk Movements Neck, shoulders, hips: None, normal, Overall Severity Severity of abnormal movements (highest score from questions above): None, normal Incapacitation due to abnormal movements: None, normal Patient's awareness of abnormal movements (rate only patient's report): No Awareness, Dental Status Current problems with teeth and/or dentures?: No Does patient usually wear dentures?: No  CIWA:  CIWA-Ar Total: 0  Musculoskeletal: Strength & Muscle Tone: within normal limits Gait & Station: normal Patient leans: N/A  Psychiatric Specialty Exam:  Presentation  General Appearance: Appropriate for Environment, casually dressed, fair hygiene   Eye Contact:Fair   Speech:Clear and Coherent   Speech Volume:Increased   Handedness:Right     Mood and Affect  Mood: "tired" appears irritable about his hospitalization initially but becomes calmer and less labile as interview progresses   Affect:irritable and labile initially - calmer and polite by conclusion of interview     Thought Process  Thought Processes:concrete, ruminative about ongoing admission   Descriptions of Associations:intact   Orientation:Full (Time, Place and Person)   Thought Content:Denies AVH, denies paranoia; denies thought insertion/withdrawal/broadcasting; has belief in personalized messages from TV and has residual delusion that team can read his mind but is able to reality test today; is not grossly responding to internal/external stimuli on exam   History of  Schizophrenia/Schizoaffective disorder:No   Duration of Psychotic Symptoms:Less than six months   Hallucinations:Denied true AVH (floater in eye)   Ideas of Reference:weather girl on TV sending him personalized messages at times on the unit   Suicidal Thoughts:Suicidal Thoughts: No   Homicidal Thoughts:Homicidal Thoughts: No     Sensorium  Memory:Immediate Fair; Recent Fair   Judgment:- Fair - compliant with meds more receptive to feedback today about behavior   Insight:- Shallow   Executive Functions  Concentration:Fair   Attention Span:Fair   [place]     Psychomotor Activity  Psychomotor Activity:Normal   Sleep: 6.75h  Physical Exam Constitutional:      Appearance: Normal appearance.  HENT:     Head: Normocephalic and atraumatic.  Pulmonary:     Effort: Pulmonary effort is normal.  Neurological:     General: No focal deficit present.     Mental Status: He is alert.  Review of Systems  Respiratory:  Negative for shortness of breath.   Cardiovascular:  Negative for chest pain.  Gastrointestinal:  Positive for diarrhea. Negative for constipation, nausea and vomiting.       Believes due to Ensure, but he likes the drinks  Psychiatric/Behavioral:  Negative for hallucinations and suicidal ideas.   Blood pressure 120/83, pulse 95, temperature 97.9 F (36.6 C), temperature source Oral, resp. rate 18, height 5' 6"  (1.676 m), weight 62.6 kg, SpO2 98 %. Body mass index is 22.27 kg/m.   Treatment Plan Summary: Daily contact with patient to assess and evaluate symptoms and progress in treatment and Medication management  Leroy Pettway is a 36 yo patient w/ no known PPH who presented endorsing AVH, delusions, and depressed mood. Patient's psychosis is improving but his emotional lability is becoming more apparent. Patient will definitely benefit from increase in Lithium dose. Patient is more reserved and isolative than  he was on the high acuity hall, and it appears that some of this is due to patient feeling his emotions more and wanting to retreat within himself. Patient's parents have also endorsed that this is a problem for patient. Patient needs to increase his interactions with other people in order to insure that his medication is therapeutic for his lability and that his lability is not forcing him to be a recluse.   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 Psychiatric Diagnoses and Treatment:              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 (r/o Substance induced psychosis vs MDD, recurrent, severe w/ psychotic features vs Schizophreniform disorder vs Bipolar disorder, current episode depressed w/ psychotic features vs psychosis secondary to general medical condition) -Continue Haldol 51m bid and 166mqhs for residual paranoia -Continue Seroquel to 2538mID and  50 mg QHS for residual paranoia -  Continue Cogentin 0.5mg42md PRN EPS - Continue Lithium 600mg64m             - Lithium lvl 1/15: 0.30 - 11/29/21  Li lvl-,0.25 CBC- WBC 10.9, TSH- WNL, and BMP -WNL (creatinine 0.76) - Repeat Li lvl 1/23 along with BMP and TSH - Agitation Protocol: Haldol 5mg I14mr po q8h PRN, Ativan 1mg po82m 2mg IM 51m hours PRN, Benadryl 50mg IM 25mo q8 hours PRN -- New  onset psychosis w/u: Negative noncontrast head CT on 11/13/2021, Hep C Ab positive; ESR 2, ceruloplasmin 20.5, HIV nonreactive, B12 348, RPR nonreactive, ANA negative, TSH 0.667, UDS positive for THC, alcohol less than 10,  BMP within normal limits other than a glucose of 123, white count 13.1 trending down to 10.2, AST elevated at 62 and ALT elevated at 103, B1 125.3, Heavy metals negative --Antipsychotic lab monitoring: EKG: QTC 424m on 11/28/21, A1c 5.5, Lipids WNL             -- Encouraged patient to participate in unit milieu and in scheduled group therapies                EtoH use (r/o alcohol use d/o) Cocaine use - episodic (r/o stimulant use d/o) 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 Medical Issues Being Addressed:              Tobacco Use Disorder             -- Nicotine patch 22m24 hours ordered, scheduled             -- Smoking cessation encouraged               Hx of Gastric Ulcers -- Continue home Pepcid 2010mID   Hep C ab positive/Transaminitis -- Hep C RNA Quant lab-353,000 - Will need f/u OP -- AST trending up to 62> 94 and ALT up to 103> 109 (has been at AST 155 and ALT 228 2 years ago) with T bili 1.4 > 0.9 and indirect bili 1.3 > 0.8 - Previously Consulted GI, Dr. OutPaulita Fujitaecommends RUQ US [place] if this is negative patient can be seen OP for Hep C mgmt, and his dx is likely Chronic Hep C.  - RUQ US Koreaplease see reading, recommend patient f/u OP as there is nothing acutely concerning, SW to schedule PCP   Leukocytosis  -- WBC on 11/29/21 10.9  - questionably elevated with Li Nicoletta Dresse   4. Discharge Planning:              -- Social work and case management to assist with discharge planning and identification of hospital follow-up needs prior to discharge             -- Discharge Concerns: Need to establish a safety plan; Medication compliance and  effectiveness             -- Discharge Goals: Return home with outpatient referrals for mental health follow-up including medication management/psychotherapy     PGY-2 JaiFreida BusmanD 11/30/2021, 3:24 PM

## 2021-12-01 ENCOUNTER — Encounter (HOSPITAL_COMMUNITY): Payer: Self-pay | Admitting: Student

## 2021-12-01 NOTE — Group Note (Signed)
Recreation Therapy Group Note   Group Topic:Stress Management  Group Date: 12/01/2021 Start Time: 0945 End Time: 1000 Facilitators: Caroll Rancher, [place] Location: [address] Dayroom   Goal Area(s) Addresses:  Patient will identify positive stress management techniques. Patient will identify benefits of using stress management post d/c.   Group Description:  Meditation.  LRT played a meditation that focused on being resilient in the face of adversity.  Patients were encouraged to get comfortable and allow their breathing to relax them as much as possible.  Patients were to listen and follow along as meditation played to fully engage in the practice.   Affect/Mood: Appropriate   Participation Level: Active   Participation Quality: Independent   Behavior: Appropriate   Speech/Thought Process: Focused   Insight: Good   Judgement: Good   Modes of Intervention: Meditation   Patient Response to Interventions:  Engaged   Education Outcome:  Acknowledges education and In group clarification offered    Clinical Observations/Individualized Feedback: Pt attended and participated in activity.    Plan: Continue to engage patient in RT group sessions 2-3x/week.   Caroll Rancher, LRT,CTRS  12/01/2021 11:59 AM

## 2021-12-01 NOTE — Group Note (Signed)
LCSW Group Therapy Note   Group Date: 12/01/2021 Start Time: 1300 End Time: 1400  LCSW Aftercare Discharge Planning Group Note   12/01/2021   Type of Group and Topic: Psychoeducational Group: Discharge Planning   Participation Level: Did Not Attend   Description of Group Discharge planning group reviews patients anticipated discharge plans and assists patients to anticipate and address any barriers to wellness/recovery in the community. Suicide prevention education is reviewed with patients in group.   Therapeutic Goals 1. Patients will state their anticipated discharge plan and mental health aftercare 2. Patients will identify potential barriers to wellness in the community setting 3. Patients will engage in problem solving, solution focused discussion of ways to anticipate and address barriers to wellness/recovery   Summary of Patient Progress: Did Not Attend     Therapeutic Modalities: Motivational Interviewing  Jeffrey Holland, Jeffrey Holland 12/01/2021  1:51 PM

## 2021-12-01 NOTE — Progress Notes (Signed)
Patient did not attend morning orientation group.  

## 2021-12-01 NOTE — Progress Notes (Addendum)
[facility] MD Progress Note  12/01/2021 4:04 PM Jeffrey Holland  MRN:  [id number] Subjective:   PLUMER MITTELSTAEDT is a 36 y.o. male with no known past psychiatric history, who was initially admitted for inpatient psychiatric hospitalization on 11/15/2021 for management of hallucinations and delusions.     Case was discussed in the multidisciplinary team. MAR was reviewed and patient was compliant with medications. He did not require PRN medications for agitation or AH.    Psychiatric Team made the following recommendations yesterday:  -Continue Haldol 52m bid and 152mqhs for residual paranoia -Continue Seroquel to 2558mID and  50 mg QHS for residual paranoia -  Continue Cogentin 0.5mg25md PRN EPS - Continue Lithium 600mg71m            - Repeat Li lvl 1/23 along with BMP and TSH  On assessment this AM patient reports that he has gone to every group. Patient reports that he does not believe that the providers have telepathy. Patient laughs when questioned about ideas of reference and no longer endorses belief that the weather girl is sending him personalized messages. He denies first rank symptoms today. Patient also denies that he has telepathy or psychic powers. Patient reports that he does not believe he is having any adverse side effects to his medications. Patient reports that he actually slept ok last night and is eating very well. Patient denies SI, HI, AVH today. Patient reports that he has not believed in psychic powers for a few days now. He voices no physical complaints other than occasional loose stools with use of Ensure. We discussed plans to d/c Ensure now that he is eating well. Extensive time was spent providing psychoeducation about his medications and his Hep C diagnosis and time was given for questions.  Principal Problem: Schizophrenia spectrum disorder with psychotic disorder type not yet determined (HCC) [place]: Principal Problem:   Schizophrenia spectrum disorder with  psychotic disorder type not yet determined (HCC) [place] Problems:   Alcohol abuse   Cannabis abuse   Cocaine abuse, episodic use (HCC) [place]  Total Time spent with patient:  I personally spent 30 minutes on the unit in direct patient care. The direct patient care time included face-to-face time with the patient, reviewing the patient's chart, communicating with other professionals, and coordinating care. Greater than 50% of this time was spent in counseling or coordinating care with the patient regarding goals of hospitalization, psycho-education, and discharge planning needs.  Past Psychiatric History:  See H&P  Past Medical History:  Past Medical History:  Diagnosis Date   Anxiety    Asthma    Panic    Family History: see H&P  Family Psychiatric  History: See H&P  Social History:  Social History   Substance and Sexual Activity  Alcohol Use Yes     Social History   Substance and Sexual Activity  Drug Use Yes   Types: Marijuana, Methamphetamines, Cocaine    Social History   Socioeconomic History   Marital status: Single    Spouse name: Not on file   Number of children: Not on file   Years of education: Not on file   Highest education level: Not on file  Occupational History   Not on file  Tobacco Use   Smoking status: Every Day    Packs/day: 1.00    Types: Cigarettes   Smokeless tobacco: Not on file  Vaping Use   Vaping Use: Every day  Substance and Sexual  Activity   Alcohol use: Yes   Drug use: Yes    Types: Marijuana, Methamphetamines, Cocaine   Sexual activity: Yes    Birth control/protection: None  Other Topics Concern   Not on file  Social History Narrative   ** Merged History Encounter **       Social Determinants of Health   Financial Resource Strain: Not on file  Food Insecurity: Not on file  Transportation Needs: Not on file  Physical Activity: Not on file  Stress: Not on file  Social Connections: Not on file   Sleep:  Improved  Appetite:  Good  Current Medications: Current Facility-Administered Medications  Medication Dose Route Frequency Provider Last Rate Last Admin   acetaminophen (TYLENOL) tablet 650 mg  650 mg Oral Q6H PRN Margorie John W, PA-C   650 mg at 11/23/21 1330   albuterol (VENTOLIN HFA) 108 (90 Base) MCG/ACT inhaler 2 puff  2 puff Inhalation Q6H PRN Harlow Asa, MD   2 puff at 12/01/21 0007   alum & mag hydroxide-simeth (MAALOX/MYLANTA) 200-200-20 MG/5ML suspension 30 mL  30 mL Oral Q4H PRN Margorie John W, PA-C       benztropine (COGENTIN) tablet 0.5 mg  0.5 mg Oral BID PRN Harlow Asa, MD       diphenhydrAMINE (BENADRYL) capsule 50 mg  50 mg Oral Q8H PRN Margorie John W, PA-C   50 mg at 11/20/21 2223   Or   diphenhydrAMINE (BENADRYL) injection 50 mg  50 mg Intramuscular Q8H PRN Margorie John W, PA-C   50 mg at 11/19/21 1255   famotidine (PEPCID) tablet 20 mg  20 mg Oral BID Damita Dunnings B, MD   20 mg at 12/01/21 0807   feeding supplement (ENSURE ENLIVE / ENSURE PLUS) liquid 237 mL  237 mL Oral BID BM Damita Dunnings B, MD   237 mL at 12/01/21 1424   haloperidol (HALDOL) tablet 10 mg  10 mg Oral QHS Nelda Marseille, Bradly Sangiovanni E, MD   10 mg at 11/30/21 2153   haloperidol (HALDOL) tablet 5 mg  5 mg Oral Q8H PRN Prescilla Sours, PA-C   5 mg at 11/18/21 2311   Or   haloperidol lactate (HALDOL) injection 5 mg  5 mg Intramuscular Q8H PRN Margorie John W, PA-C   5 mg at 11/19/21 1255   haloperidol (HALDOL) tablet 5 mg  5 mg Oral BID Viann Fish E, MD   5 mg at 12/01/21 2947   lithium carbonate capsule 600 mg  600 mg Oral BID WC Damita Dunnings B, MD   600 mg at 12/01/21 0807   LORazepam (ATIVAN) tablet 1 mg  1 mg Oral Q8H PRN Freida Busman, MD       Or   LORazepam (ATIVAN) injection 2 mg  2 mg Intramuscular Q8H PRN Damita Dunnings B, MD       magnesium hydroxide (MILK OF MAGNESIA) suspension 30 mL  30 mL Oral Daily PRN Margorie John W, PA-C       multivitamin with minerals tablet 1 tablet  1 tablet Oral  Daily Nelda Marseille, Mahreen Schewe E, MD   1 tablet at 12/01/21 6546   nicotine (NICODERM CQ - dosed in mg/24 hours) patch 21 mg  21 mg Transdermal Daily Hill, Jackie Plum, MD   21 mg at 12/01/21 0808   QUEtiapine (SEROQUEL) tablet 25 mg  25 mg Oral BID Harlow Asa, MD   25 mg at 12/01/21 1135   QUEtiapine (SEROQUEL) tablet 50 mg  50 mg Oral QHS Damita Dunnings B, MD   50 mg at 11/30/21 2153   thiamine tablet 100 mg  100 mg Oral Daily Harlow Asa, MD   100 mg at 12/01/21 1443    Lab Results: No results found for this or any previous visit (from the past 26 hour(s)).  Blood Alcohol level:  Lab Results  Component Value Date   ETH <10 11/14/2021   ETH <10 15/40/0867    Metabolic Disorder Labs: Lab Results  Component Value Date   HGBA1C 5.5 11/15/2021   MPG 111.15 11/15/2021   No results found for: PROLACTIN Lab Results  Component Value Date   CHOL 151 11/15/2021   TRIG 98 11/15/2021   HDL 42 11/15/2021   CHOLHDL 3.6 11/15/2021   VLDL 20 11/15/2021   LDLCALC 89 11/15/2021    Physical Findings: AIMS: Facial and Oral Movements Muscles of Facial Expression: None, normal Lips and Perioral Area: None, normal Jaw: None, normal Tongue: None, normal,Extremity Movements Upper (arms, wrists, hands, fingers): None, normal Lower (legs, knees, ankles, toes): None, normal, Trunk Movements Neck, shoulders, hips: None, normal, Overall Severity Severity of abnormal movements (highest score from questions above): None, normal Incapacitation due to abnormal movements: None, normal Patient's awareness of abnormal movements (rate only patient's report): No Awareness, Dental Status Current problems with teeth and/or dentures?: No Does patient usually wear dentures?: No  CIWA:  CIWA-Ar Total: 0  Musculoskeletal: Strength & Muscle Tone: within normal limits Gait & Station: normal Patient leans: N/A  Presentation  General Appearance: Appropriate for Environment, casually dressed, fair  hygiene   Eye Contact:Fair   Speech:Clear and Coherent   Speech Volume:Increased   Handedness:Right     Mood and Affect  Mood: hopeful and less labile today   Affect: calmer, less irritable, brighter     Thought Process  Thought Processes:linear and more goal directed   Descriptions of Associations:intact   Orientation:Full (Time, Place and Person)   Thought Content:Denies AVH, denies paranoia; denies thought insertion/withdrawal/broadcasting; denies belief in personalized messages from TV and denies residual delusion that team can read his mind, is not grossly responding to internal/external stimuli on exam, less paranoid about dispo and words from Providers   History of Schizophrenia/Schizoaffective disorder:No   Duration of Psychotic Symptoms:Less than six months   Hallucinations:Denied true AVH    Ideas of Reference: denies   Suicidal Thoughts:Suicidal Thoughts: No   Homicidal Thoughts:Homicidal Thoughts: No     Sensorium  Memory:Immediate Fair; Recent Fair   Judgment:- Fair    Insight:- Shallow   Executive Functions  Concentration:Fair   Attention Span:Fair   [place]     Psychomotor Activity  Psychomotor Activity:Normal    Sleep: 5h   Physical Exam Constitutional:      Appearance: Normal appearance.  HENT:     Head: Normocephalic and atraumatic.  Pulmonary:     Effort: Pulmonary effort is normal.  Neurological:     Mental Status: He is alert and oriented to person, place, and time.   Review of Systems  Respiratory:  Negative for shortness of breath.   Cardiovascular:  Negative for chest pain.  Gastrointestinal:  Positive for diarrhea. Negative for constipation, nausea and vomiting.       Continued drinking Ensure  Psychiatric/Behavioral:  Negative for depression, hallucinations and suicidal ideas. The patient does not have insomnia.   Blood pressure 126/86, pulse 85, temperature 97.6 F  (36.4 C), temperature source Oral, resp. rate  18, height _0  (1.676 m), weight 62.6 kg, SpO2 99 %. Body mass index is 22.27 kg/m.   Treatment Plan Summary: Daily contact with patient to assess and evaluate symptoms and progress in treatment and Medication management  Antino Mayabb is a 36 yo patient w/ no known PPH who presented endorsing AVH, delusions, and depressed mood. Patient appears less irritable today and is more logical as well. Patient has progressed to now identifying that his old thoughts and statements were bizarre. Today was the first day patient did not appear paranoid about his dispo asking whether or not he was truly going home. Patient appears to be using logic and recognizes that he will go home and that he is receiving treatment for bizarre thoughts and behaviors.   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 Psychiatric Diagnoses and Treatment:              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 (r/o Substance induced psychosis vs MDD, recurrent, severe w/ psychotic features vs Schizophreniform disorder vs Bipolar disorder, current episode depressed w/ psychotic features vs psychosis secondary to general medical condition) -Continue Haldol 95m bid and 114mqhs for residual paranoia -Continue Seroquel 2513mID and  50 mg QHS  for residual paranoia  - Discussed need for metabolic lab monitoring, weight, EKG and CBC monitoring as well as AIMS monitoring on antipsychotics and discussed hope that he will be able to be tapered to monotherapy as an outpatient over time if he remains stable -  Continue Cogentin 0.5mg70md PRN EPS - Continue Lithium 600mg56m - Repeat Li lvl 1/23 along with BMP and TSH - Discussed risks of Li toxicity and need for ongoing Li level, thyroid, and kidney function monitoring on this med as well as need for fluid hydration - Agitation Protocol: Haldol 5mg I68mr po q8h PRN, Ativan 1mg po31m 2mg IM 61m hours PRN, Benadryl 50mg IM 57mo q8 hours PRN -- New onset psychosis w/u: Negative noncontrast head CT on 11/13/2021, Hep C Ab positive; ESR 2, ceruloplasmin 20.5, HIV nonreactive, B12 348, RPR nonreactive, ANA negative, TSH 0.667, UDS positive for THC, alcohol less than 10, BMP within normal limits other than a glucose of 123, white count 13.1 trending down to 10.2, AST elevated at 62 and ALT elevated at 103, B1 125.3, Heavy metals negative --Antipsychotic lab monitoring: EKG: QTC 408ms on 166m23, A1c 5.5, Lipids WNL             -- Encouraged patient to participate in unit milieu and in scheduled group  therapies                EtoH use (r/o alcohol use d/o) Cocaine use - episodic (r/o stimulant use d/o) 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 Medical Issues Being Addressed:              Tobacco Use Disorder             -- Nicotine patch 79m/24 hours ordered, scheduled             -- Smoking cessation encouraged               Hx of Gastric Ulcers -- Continue home Pepcid 260mBID   Hep C ab positive/Transaminitis -- Hep C RNA Quant lab-353,000 - SW has scheduled F/U PCP -- AST trending up to 62> 94 and ALT up to 103> 109 (has been at AST 155 and ALT 228 2 years ago)  with T bili 1.4 > 0.9 and indirect bili 1.3 > 0.8 - Previously Consulted GI, Dr. 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  - RUQ [place] please see reading, recommend patient f/u OP as there is nothing acutely concerning, SW to schedule PCP   Leukocytosis  -- WBC on 11/29/21 10.9  - questionably elevated with LiNicoletta Dressse   4. Discharge Planning:              -- Social work and case management to assist with discharge planning and identification of hospital follow-up needs prior to discharge             -- Discharge Concerns: Need to establish a safety plan; Medication compliance and effectiveness             -- Discharge Goals: Return home with outpatient referrals for mental health follow-up including medication management/psychotherapy       PGY-2 JaFreida BusmanMD 12/01/2021, 4:04 PM

## 2021-12-01 NOTE — [facility] Group Notes (Signed)
Patient did not attend the relaxation group. 

## 2021-12-01 NOTE — Progress Notes (Signed)
Pt denies SI/HI/AVH and verbally agrees to approach staff if these become apparent or before harming themselves/others. Rates depression 0/10. Rates anxiety 5/10. Rates pain 0/10.  Pt was out of his room more today. Pt did get anxious after coming back from the gym. Pt stated, "I just went to lay and rest for a little and started getting racing thoughs and my heart rate increased." Scheduled medications administered to pt, per MD orders. RN provided support and encouragement to pt. Q15 min safety checks implemented and continued. Pt safe on the unit. RN will continue to monitor and intervene as needed.   12/01/21 0807  Psych Admission Type (Psych Patients Only)  Admission Status Involuntary  Psychosocial Assessment  Patient Complaints None  Eye Contact Fair  Facial Expression Animated  Affect Flat;Appropriate to circumstance  Speech Logical/coherent  Interaction Guarded  Motor Activity Fidgety  Appearance/Hygiene Disheveled  Behavior Characteristics Calm;Appropriate to situation  Mood Pleasant  Thought Process  Coherency Circumstantial  Content WDL  Delusions None reported or observed  Perception WDL  Hallucination None reported or observed  Judgment Impaired  Confusion None  Danger to Self  Current suicidal ideation? Denies  Danger to Others  Danger to Others None reported or observed

## 2021-12-01 NOTE — [facility] Group Notes (Signed)
Patient attended the AA group. 

## 2021-12-02 NOTE — [facility] Group Notes (Signed)
.  Psychoeducational Group Note ° ° ° °Date:12/02/21 °Time: 1300-1400 ° ° ° °Purpose of Group: . The group focus' on teaching patients on how to identify their needs and their Life Skills:  A group where two lists are made. What people need and what are things that we do that are unhealthy. The lists are developed by the patients and it is explained that we often do the actions that are not healthy to get our list of needs met. ° °Goal:: to develop the coping skills needed to get their needs met ° °Participation Level:  Did not attend °Jeffrey Holland A ° °

## 2021-12-02 NOTE — Progress Notes (Signed)
Patient has been up and active on the unit, attended group this evening and is looking forward to discharge on tomorrow. He reports that he plans to stay on his medications once discharged.Patient currently denies having pain, -si/hi/a/v hall. Support and encouragement offered, safety maintained on unit, will continue to monitor.

## 2021-12-02 NOTE — Progress Notes (Signed)
Pt is calm and cooperative this shift.  Pt denies SI/HI/AVH.  RN assessed for needs and concerns and administered medications per provider orders. Pt taking medications without incident.  Vital signs are stable.

## 2021-12-02 NOTE — [facility] Group Notes (Signed)
Goals Group 12/02/21    Group Focus: affirmation, clarity of thought, and goals/reality orientation Treatment Modality:  Psychoeducation Interventions utilized were assignment, group exercise, and support Purpose: To be able to understand and verbalize the reason for their admission to the hospital. To understand that the medication helps with their chemical imbalance but they also need to work on their choices in life. To be challenged to develop a list of 30 positives about themselves. Also introduce the concept that "feelings" are not reality.  Participation Level:  Active  Participation Quality:  Appropriate  Affect:  flat  Cognitive:  Appropriate  Insight:  lacking  Engagement in Group:  Engaged  Additional Comments:  Rates his energy at a 5/10. Affect is flat  Dione Housekeeper

## 2021-12-02 NOTE — Group Note (Signed)
[facility] Group Notes: (Clinical Social Work)  ° °12/02/2021    ° ° °Type of Therapy:  Group Therapy  ° °Participation Level:  Did Not Attend - was invited both individually by MHT and by overhead announcement, chose not to attend. ° ° °Jaimes Eckert Grossman-Orr, LCSW °12/02/2021, 3:57 PM °   °

## 2021-12-02 NOTE — Progress Notes (Signed)
[facility] Group Notes:  (Nursing/MHT/Case Management/Adjunct)  Date:  12/02/2021  Time:  2015  Type of Therapy:   wrap up group  Participation Level:  Active  Participation Quality:  Appropriate, Attentive, Sharing, and Supportive  Affect:  Appropriate  Cognitive:  Alert  Insight:  Improving  Engagement in Group:  Engaged  Modes of Intervention:  Clarification, Education, and Support  Summary of Progress/Problems: Positive thinking and positive change were discussed.   Jeffrey Holland 12/02/2021, 9:29 PM

## 2021-12-02 NOTE — Progress Notes (Signed)
Patient has been up and active on the unit, attended group this evening and has voiced no complaints. Patient currently denies having pain, -si/hi/a/v hall. Support and encouragement offered, safety maintained on unit, will continue to monitor.  

## 2021-12-02 NOTE — Progress Notes (Signed)
[facility] MD Progress Note  12/02/2021 8:00 AM Jeffrey Holland  MRN:  [id number]  Chief Complaint: psychosis  Reason for Admission:   Jeffrey Holland is a 36 y.o. male with no known past psychiatric history, who was initially admitted for inpatient psychiatric hospitalization on 11/15/2021 for management of hallucinations and delusions.  The patient is currently on Hospital Day 17.   Chart Review from last 24 hours:  The patient's chart was reviewed and nursing notes were reviewed. The patient's case was discussed in multidisciplinary team meeting. Per nursing, he was visible on the unit and had no behavioral or safety concerns noted. He attended some groups. Per [facility] he was compliant with scheduled medications and did not require PRNs for agitation or anxiety or sleep.  Information Obtained Today During Patient Interview: The patient was seen and evaluated on the unit.  He denies SI, HI, AVH, first rank symptoms, ideas of reference, or magical thinking.  He specifically states he no longer believes that team members can read his thoughts or that he is getting any special messages from the weather girl on the TV today. He also denies belief that he has any residual psychic powers. He understands he is going home next week and denies concerns that he is going to jail. He did have some questions again today about the IVC paperwork he received. We discussed that he was served with court papers about his IVC hearing and he was provided education on IVC process. He denies any TD or EPS symptoms.  He reports a good appetite and fair sleep and denies medication side effects.  He states that he is showering and going to groups.  He voices no physical complaints.  Principal Problem: Schizophrenia spectrum disorder with psychotic disorder type not yet determined ([place]) Diagnosis: Principal Problem:   Schizophrenia spectrum disorder with psychotic disorder type not yet determined ([place]) Active Problems:   Alcohol  abuse   Cannabis abuse   Cocaine abuse, episodic use ([place])   Hepatitis C  Total Time spent with patient: I personally spent 30 minutes on the unit in direct patient care. The direct patient care time included face-to-face time with the patient, reviewing the patient's chart, communicating with other professionals, and coordinating care. Greater than 50% of this time was spent in counseling or coordinating care with the patient regarding goals of hospitalization, psycho-education, and discharge planning needs.  Past Psychiatric History:  See H & P  Past Medical History:  Past Medical History:  Diagnosis Date   Anxiety    Asthma    Panic     Family History: see H&P  Family Psychiatric  History:  See H&P  Social History:  Social History   Substance and Sexual Activity  Alcohol Use Yes     Social History   Substance and Sexual Activity  Drug Use Yes   Types: Marijuana, Methamphetamines, Cocaine    Social History   Socioeconomic History   Marital status: Single    Spouse name: Not on file   Number of children: Not on file   Years of education: Not on file   Highest education level: Not on file  Occupational History   Not on file  Tobacco Use   Smoking status: Every Day    Packs/day: 1.00    Types: Cigarettes   Smokeless tobacco: Not on file  Vaping Use   Vaping Use: Every day  Substance and Sexual Activity   Alcohol use: Yes   Drug use: Yes  Types: Marijuana, Methamphetamines, Cocaine   Sexual activity: Yes    Birth control/protection: None  Other Topics Concern   Not on file  Social History Narrative   ** Merged History Encounter **       Social Determinants of Health   Financial Resource Strain: Not on file  Food Insecurity: Not on file  Transportation Needs: Not on file  Physical Activity: Not on file  Stress: Not on file  Social Connections: Not on file   Sleep: Fair per patient report  Appetite: Good  Current Medications: Current  Facility-Administered Medications  Medication Dose Route Frequency Provider Last Rate Last Admin   acetaminophen (TYLENOL) tablet 650 mg  650 mg Oral Q6H PRN Prescilla Sours, PA-C   650 mg at 11/23/21 1330   albuterol (VENTOLIN HFA) 108 (90 Base) MCG/ACT inhaler 2 puff  2 puff Inhalation Q6H PRN Harlow Asa, MD   2 puff at 12/01/21 2101   alum & mag hydroxide-simeth (MAALOX/MYLANTA) 200-200-20 MG/5ML suspension 30 mL  30 mL Oral Q4H PRN Margorie John W, PA-C       benztropine (COGENTIN) tablet 0.5 mg  0.5 mg Oral BID PRN Harlow Asa, MD       diphenhydrAMINE (BENADRYL) capsule 50 mg  50 mg Oral Q8H PRN Margorie John W, PA-C   50 mg at 11/20/21 2223   Or   diphenhydrAMINE (BENADRYL) injection 50 mg  50 mg Intramuscular Q8H PRN Margorie John W, PA-C   50 mg at 11/19/21 1255   famotidine (PEPCID) tablet 20 mg  20 mg Oral BID Damita Dunnings B, MD   20 mg at 12/01/21 1628   haloperidol (HALDOL) tablet 10 mg  10 mg Oral QHS Nelda Marseille, Tavone Caesar E, MD   10 mg at 12/01/21 2101   haloperidol (HALDOL) tablet 5 mg  5 mg Oral Q8H PRN Prescilla Sours, PA-C   5 mg at 11/18/21 2311   Or   haloperidol lactate (HALDOL) injection 5 mg  5 mg Intramuscular Q8H PRN Margorie John W, PA-C   5 mg at 11/19/21 1255   haloperidol (HALDOL) tablet 5 mg  5 mg Oral BID Viann Fish E, MD   5 mg at 12/01/21 1628   lithium carbonate capsule 600 mg  600 mg Oral BID WC Damita Dunnings B, MD   600 mg at 12/01/21 1628   LORazepam (ATIVAN) tablet 1 mg  1 mg Oral Q8H PRN Freida Busman, MD       Or   LORazepam (ATIVAN) injection 2 mg  2 mg Intramuscular Q8H PRN Damita Dunnings B, MD       magnesium hydroxide (MILK OF MAGNESIA) suspension 30 mL  30 mL Oral Daily PRN Margorie John W, PA-C       multivitamin with minerals tablet 1 tablet  1 tablet Oral Daily Nelda Marseille, Jermiah Howton E, MD   1 tablet at 12/01/21 3790   nicotine (NICODERM CQ - dosed in mg/24 hours) patch 21 mg  21 mg Transdermal Daily Hill, Jackie Plum, MD   21 mg at 12/01/21 0808    QUEtiapine (SEROQUEL) tablet 25 mg  25 mg Oral BID Viann Fish E, MD   25 mg at 12/01/21 1628   QUEtiapine (SEROQUEL) tablet 50 mg  50 mg Oral QHS Damita Dunnings B, MD   50 mg at 12/01/21 2101   thiamine tablet 100 mg  100 mg Oral Daily Harlow Asa, MD   100 mg at 12/01/21 2409    Lab  Results:  No results found for this or any previous visit (from the past 48 hour(s)).   Blood Alcohol level:  Lab Results  Component Value Date   ETH <10 11/14/2021   ETH <10 92/33/0076    Metabolic Disorder Labs: Lab Results  Component Value Date   HGBA1C 5.5 11/15/2021   MPG 111.15 11/15/2021   No results found for: PROLACTIN Lab Results  Component Value Date   CHOL 151 11/15/2021   TRIG 98 11/15/2021   HDL 42 11/15/2021   CHOLHDL 3.6 11/15/2021   VLDL 20 11/15/2021   LDLCALC 89 11/15/2021    Physical Findings: AIMS: Facial and Oral Movements Muscles of Facial Expression: None, normal Lips and Perioral Area: None, normal Jaw: None, normal Tongue: None, normal,Extremity Movements Upper (arms, wrists, hands, fingers): None, normal Lower (legs, knees, ankles, toes): None, normal, Trunk Movements Neck, shoulders, hips: None, normal, Overall Severity Severity of abnormal movements (highest score from questions above): None, normal Incapacitation due to abnormal movements: None, normal Patient's awareness of abnormal movements (rate only patient's report): No Awareness, Dental Status Current problems with teeth and/or dentures?: No Does patient usually wear dentures?: No  CIWA:  CIWA-Ar Total: 0  Musculoskeletal: Strength & Muscle Tone: within normal limits Gait & Station: normal Patient leans: N/A  Psychiatric Specialty Exam:  Presentation  General Appearance: casually dressed, good hygiene   Eye Contact: Good   Speech:Clear and Coherent , normal rate   Speech Volume:Normal   Mood and Affect  Mood:described as "good" - appears calm and more euthymic   Affect:  polite, brighter, no longer labile or irritable   Thought Process  Thought Processes:superficially goal directed and linear   Orientation: Oriented to self, situation, place and time   Thought Content: He denies SI, HI, AVH, ideas of reference, first rank symptoms - he does not make any delusional statements on exam; he is not grossly responding to any internal or external stimuli on exam.   Hallucinations: Denied   Ideas of Reference:Denied   Suicidal Thoughts:Suicidal Thoughts: No   Homicidal Thoughts:Homicidal Thoughts: No     Sensorium  Memory: Fair   Judgment: Fair   Insight: Shallow     Executive Functions  Concentration: Fair   Attention Span:Fair   [place]   Language:Good     Psychomotor Activity  Psychomotor Activity:No cogwheeling, no stiffness, no tremor; AIMS 0    Assets  Assets:Resilience  Sleep 6.5 hours     Physical Exam Vitals and nursing note reviewed.  HENT:     Head: Normocephalic and atraumatic.  Pulmonary:     Effort: Pulmonary effort is normal.  Neurological:     General: No focal deficit present.     Mental Status: He is alert.   Review of Systems  Cardiovascular:  Negative for chest pain.  Gastrointestinal:  Negative for constipation, diarrhea, nausea and vomiting.  Blood pressure (!) 140/119, pulse 94, temperature 97.9 F (36.6 C), temperature source Oral, resp. rate 18, height 5' 6"  (1.676 m), weight 62.6 kg, SpO2 99 %. Body mass index is 22.27 kg/m.   Treatment Plan Summary:  Diagnoses / Active Problems: 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 R/o alcohol use d/o R/o stimulant use d/o 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 Hep C Ab positive with Transaminitis H/o gastric ulcers Tobacco use d/o  PLAN: Safety and Monitoring:             -- Involuntary admission to inpatient psychiatric unit for  safety, stabilization and treatment             -- Daily contact with  patient to assess and evaluate symptoms and progress in treatment             -- Patient's case to be discussed in multi-disciplinary team meeting             -- Observation Level : q15 minute checks             -- Vital signs:  q12 hours             -- Precautions: suicide, elopement, and assault   2. Psychiatric Diagnoses and Treatment:              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 (r/o Substance induced psychosis vs MDD, recurrent, severe w/ psychotic features vs Schizophreniform disorder vs Bipolar disorder, current episode depressed w/ psychotic features vs psychosis secondary to general medical condition) -Continue Haldol 69m bid and 189mqhs  -Continue Seroquel to 2571mID and  50 mg QHS - Continue Cogentin 0.5mg20md PRN EPS - Continue Lithium 600mg54m - Repeat Li lvl 1/23 along with BMP and TSH - Agitation Protocol: Haldol 5mg I57mr po q8h PRN, Ativan 1mg po57m 2mg IM 57m hours PRN, Benadryl 50mg IM 62mo q8 hours PRN -- New onset psychosis w/u: Negative noncontrast head CT on 11/13/2021, Hep C Ab positive; ESR 2, ceruloplasmin 20.5, HIV nonreactive, B12 348, RPR nonreactive, ANA negative, TSH 0.667, UDS positive for THC, alcohol less than 10, BMP within normal limits other than a glucose of 123, white count 13.1 trending down to 10.2, AST elevated at 62 and ALT elevated at 103, B1 125.3, Heavy metals negative --Antipsychotic lab monitoring: EKG: QTC 408ms on 180m23, A1c 5.5, Lipids WNL             -- Encouraged patient to participate in unit milieu and in scheduled group therapies                EtoH use (r/o alcohol use d/o) Cocaine use - episodic (r/o stimulant use d/o) 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 Medical Issues Being Addressed:              Tobacco Use Disorder             -- Nicotine patch 21mg/24 ho91mordered,  scheduled             -- Smoking cessation encouraged               Hx of Gastric Ulcers -- Continue home Pepcid 20mg BID   58mC ab positive/Transaminitis -- Hep C RNA Quant lab-353,000 -- AST trending up to 62> 94 and ALT up to 103> 109 (has been at AST 155 and ALT 228 2 years ago) with T bili 1.4 > 0.9 and indirect bili 1.3 > 0.8 - Previously Consulted GI, Dr. 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  - RUQ US * please Koreae reading, recommend patient f/u OP as there is nothing acutely concerning, SW to schedule PCP   Leukocytosis  -- WBC on 11/29/21 10.9  - questionably elevated  with Nicoletta Dress use   4. Discharge Planning:              -- Social work and case management to assist with discharge planning and identification of hospital follow-up needs prior to discharge             -- Discharge Concerns: Need to establish a safety plan; Medication compliance and effectiveness             -- Discharge Goals: Return home with outpatient referrals for mental health follow-up including medication management/psychotherapy    Harlow Asa, MD, FAPA 12/02/2021, 8:00 AM

## 2021-12-03 NOTE — Progress Notes (Signed)
°   12/03/21 0830  Psych Admission Type (Psych Patients Only)  Admission Status Voluntary  Psychosocial Assessment  Patient Complaints Anxiety  Eye Contact Fair  Facial Expression Animated  Affect Anxious  Speech Logical/coherent  Interaction Guarded  Motor Activity Other (Comment)  Appearance/Hygiene Improved  Behavior Characteristics Cooperative;Anxious  Mood Anxious  Thought Process  Coherency WDL  Content WDL  Delusions None reported or observed  Perception WDL  Hallucination None reported or observed  Judgment Impaired  Confusion None  Danger to Self  Current suicidal ideation? Denies  Danger to Others  Danger to Others None reported or observed

## 2021-12-03 NOTE — Group Note (Signed)
Date:  12/03/2021 Time:  9:58 AM  Group Topic/Focus:  Orientation:   The focus of this group is to educate the patient on the purpose and policies of crisis stabilization and provide a format to answer questions about their admission.  The group details unit policies and expectations of patients while admitted.    Participation Level:  Did Not Attend  Participation Quality:    Affect:    Cognitive:    Insight:   Engagement in Group:    Modes of Intervention:    Additional Comments:    Garvin Fila 12/03/2021, 9:58 AM

## 2021-12-03 NOTE — [facility] Group Notes (Signed)
Adult Psychoeducational Group Not Date:  12/03/2021 Time:  [id number] Group Topic/Focus: PROGRESSIVE RELAXATION. A group where deep breathing is taught and tensing and relaxation muscle groups is used. Imagery is used as well.  Pts are asked to imagine 3 pillars that hold them up when they are not able to hold themselves up.  Participation Level: did not attend  Paulino Rily

## 2021-12-03 NOTE — Progress Notes (Addendum)
[facility] MD Progress Note  12/03/2021 3:43 PM Jeffrey Holland  MRN:  [id number]  Chief Complaint: psychosis  Reason for Admission:   Jeffrey Holland is a 36 y.o. male with no known past psychiatric history, who was initially admitted for inpatient psychiatric hospitalization on 11/15/2021 for management of hallucinations and delusions.  The patient is currently on Hospital Day 18.   Chart Review from last 24 hours:  The patient's chart was reviewed and nursing notes were reviewed. The patient's case was discussed in multidisciplinary team meeting. Per nursing, he was visible on the unit and had no behavioral or safety concerns noted. He attended some groups. Per [facility] he was compliant with scheduled medications and did not require PRNs for agitation or anxiety or sleep.  As needed albuterol for SOB.  Information Obtained Today During Patient Interview: The patient was seen and evaluated on the unit. Patient denied SI/HI/AVH, delusions, paranoia, first rank symptoms, magical thinking, and contracted to safety on the unit. Patient was not grossly responding to internal/external stimuli nor made any delusional statements during encounter.  He was alert and oriented x4, to self, location, year, month.  He denied anxiety and depression, reported that his mood is "good".  He reported that his sleep, appetite, mood are all stable.  Patient denied medication side effects, is amenable to lab draws, and had no other concerns.   Principal Problem: Schizophrenia spectrum disorder with psychotic disorder type not yet determined ([name]) Diagnosis: Principal Problem:   Schizophrenia spectrum disorder with psychotic disorder type not yet determined ([name]) Active Problems:   Alcohol abuse   Cannabis abuse   Cocaine abuse, episodic use ([place])   Hepatitis C  Total Time spent with patient: I personally spent 30 minutes on the unit in direct patient care. The direct patient care time included face-to-face time with the  patient, reviewing the patient's chart, communicating with other professionals, and coordinating care. Greater than 50% of this time was spent in counseling or coordinating care with the patient regarding goals of hospitalization, psycho-education, and discharge planning needs.  Past Psychiatric History:  See H & P  Past Medical History:  Past Medical History:  Diagnosis Date   Anxiety    Asthma    Panic     Family History: see H&P  Family Psychiatric  History:  See H&P  Social History:  Social History   Substance and Sexual Activity  Alcohol Use Yes     Social History   Substance and Sexual Activity  Drug Use Yes   Types: Marijuana, Methamphetamines, Cocaine    Social History   Socioeconomic History   Marital status: Single    Spouse name: Not on file   Number of children: Not on file   Years of education: Not on file   Highest education level: Not on file  Occupational History   Not on file  Tobacco Use   Smoking status: Every Day    Packs/day: 1.00    Types: Cigarettes   Smokeless tobacco: Not on file  Vaping Use   Vaping Use: Every day  Substance and Sexual Activity   Alcohol use: Yes   Drug use: Yes    Types: Marijuana, Methamphetamines, Cocaine   Sexual activity: Yes    Birth control/protection: None  Other Topics Concern   Not on file  Social History Narrative   ** Merged History Encounter **       Social Determinants of Health   Financial Resource Strain: Not on file  Food Insecurity: Not on file  Transportation Needs: Not on file  Physical Activity: Not on file  Stress: Not on file  Social Connections: Not on file   Sleep: Fair per patient report  Appetite: Good  Current Medications: Current Facility-Administered Medications  Medication Dose Route Frequency Provider Last Rate Last Admin   acetaminophen (TYLENOL) tablet 650 mg  650 mg Oral Q6H PRN Prescilla Sours, PA-C   650 mg at 11/23/21 1330   albuterol (VENTOLIN HFA) 108 (90 Base)  MCG/ACT inhaler 2 puff  2 puff Inhalation Q6H PRN Harlow Asa, MD   2 puff at 12/03/21 0826   alum & mag hydroxide-simeth (MAALOX/MYLANTA) 200-200-20 MG/5ML suspension 30 mL  30 mL Oral Q4H PRN Margorie John W, PA-C       benztropine (COGENTIN) tablet 0.5 mg  0.5 mg Oral BID PRN Harlow Asa, MD       diphenhydrAMINE (BENADRYL) capsule 50 mg  50 mg Oral Q8H PRN Margorie John W, PA-C   50 mg at 11/20/21 2223   Or   diphenhydrAMINE (BENADRYL) injection 50 mg  50 mg Intramuscular Q8H PRN Margorie John W, PA-C   50 mg at 11/19/21 1255   famotidine (PEPCID) tablet 20 mg  20 mg Oral BID Damita Dunnings B, MD   20 mg at 12/03/21 0824   haloperidol (HALDOL) tablet 10 mg  10 mg Oral QHS Nelda Marseille, Brooklynn Brandenburg E, MD   10 mg at 12/02/21 2132   haloperidol (HALDOL) tablet 5 mg  5 mg Oral Q8H PRN Prescilla Sours, PA-C   5 mg at 11/18/21 2311   Or   haloperidol lactate (HALDOL) injection 5 mg  5 mg Intramuscular Q8H PRN Margorie John W, PA-C   5 mg at 11/19/21 1255   haloperidol (HALDOL) tablet 5 mg  5 mg Oral BID Viann Fish E, MD   5 mg at 12/03/21 7169   lithium carbonate capsule 600 mg  600 mg Oral BID WC Damita Dunnings B, MD   600 mg at 12/03/21 6789   LORazepam (ATIVAN) tablet 1 mg  1 mg Oral Q8H PRN Freida Busman, MD       Or   LORazepam (ATIVAN) injection 2 mg  2 mg Intramuscular Q8H PRN Damita Dunnings B, MD       magnesium hydroxide (MILK OF MAGNESIA) suspension 30 mL  30 mL Oral Daily PRN Margorie John W, PA-C       multivitamin with minerals tablet 1 tablet  1 tablet Oral Daily Viann Fish E, MD   1 tablet at 12/03/21 3810   nicotine (NICODERM CQ - dosed in mg/24 hours) patch 21 mg  21 mg Transdermal Daily Hill, Jackie Plum, MD   21 mg at 12/03/21 0824   QUEtiapine (SEROQUEL) tablet 25 mg  25 mg Oral BID Viann Fish E, MD   25 mg at 12/03/21 1211   QUEtiapine (SEROQUEL) tablet 50 mg  50 mg Oral QHS Damita Dunnings B, MD   50 mg at 12/02/21 2132   thiamine tablet 100 mg  100 mg Oral Daily  Harlow Asa, MD   100 mg at 12/03/21 1751    Lab Results:  No results found for this or any previous visit (from the past 29 hour(s)).   Blood Alcohol level:  Lab Results  Component Value Date   [facility] <10 11/14/2021   ETH <10 02/58/5277    Metabolic Disorder Labs: Lab Results  Component Value Date   HGBA1C 5.5  11/15/2021   MPG 111.15 11/15/2021   No results found for: PROLACTIN Lab Results  Component Value Date   CHOL 151 11/15/2021   TRIG 98 11/15/2021   HDL 42 11/15/2021   CHOLHDL 3.6 11/15/2021   VLDL 20 11/15/2021   LDLCALC 89 11/15/2021    Physical Findings: AIMS: Facial and Oral Movements Muscles of Facial Expression: None, normal Lips and Perioral Area: None, normal Jaw: None, normal Tongue: None, normal,Extremity Movements Upper (arms, wrists, hands, fingers): None, normal Lower (legs, knees, ankles, toes): None, normal, Trunk Movements Neck, shoulders, hips: None, normal, Overall Severity Severity of abnormal movements (highest score from questions above): None, normal Incapacitation due to abnormal movements: None, normal Patient's awareness of abnormal movements (rate only patient's report): No Awareness, Dental Status Current problems with teeth and/or dentures?: No Does patient usually wear dentures?: No  CIWA:  CIWA-Ar Total: 0  Musculoskeletal: Strength & Muscle Tone: within normal limits Gait & Station: normal Patient leans: N/A  Psychiatric Specialty Exam:  Presentation  General Appearance: casually dressed, adequate hygiene   Eye Contact: Good   Speech:Clear and Coherent , normal rate, spontaneous   Speech Volume:Normal   Mood and Affect  Mood:described as "good" -appeared euthymic, calm   Affect: polite, brighter, stable mood, no irritability   Thought Process  Thought Processes:superficially goal directed and linear   Orientation: Oriented to self, situation, place and time   Thought Content: He denied SI, HI, AVH, ideas  of reference, first rank symptoms, magical thinking- he does not make any delusional statements on exam; he is not grossly responding to any internal or external stimuli on exam.   Hallucinations: Denied   Ideas of Reference:Denied   Suicidal Thoughts:Suicidal Thoughts: No   Homicidal Thoughts:Homicidal Thoughts: No     Sensorium  Memory: Fair   Judgment: Fair   Insight: Shallow     Executive Functions  Concentration: Fair   Attention Span:Fair   Green   Language:Good     Psychomotor Activity  Psychomotor Activity:No cogwheeling, no stiffness, no tremor; AIMS 0    Assets  Assets:Resilience  Sleep Total time unrecorded   Physical Exam Vitals and nursing note reviewed.  HENT:     Head: Normocephalic and atraumatic.  Pulmonary:     Effort: Pulmonary effort is normal.  Neurological:     General: No focal deficit present.     Mental Status: He is alert.   Review of Systems  Cardiovascular:  Negative for chest pain.  Gastrointestinal:  Negative for constipation, diarrhea, nausea and vomiting.  Blood pressure 115/82, pulse 91, temperature (!) 97.5 F (36.4 C), temperature source Oral, resp. rate 18, height _0  (1.676 m), weight 62.6 kg, SpO2 98 %. Body mass index is 22.27 kg/m.   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 Psychiatric Diagnoses and Treatment:              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 (r/o Substance induced psychosis vs MDD, recurrent, severe w/ psychotic features vs Schizophreniform disorder vs Bipolar disorder, current episode depressed w/ psychotic features vs psychosis secondary to general medical condition) -Continue Haldol 57m bid and 139mqhs  -Continue Seroquel to 2556mID and  50 mg QHS - Continue Cogentin 0.5mg22md PRN EPS - Continue Lithium 600mg58m -Follow-up repeat Li lvl 1/23 along with BMP and TSH - Agitation Protocol: Haldol 5mg I92mr po q8h PRN, Ativan 1mg po42m 2mg IM 34m hours PRN, Benadryl 50mg IM 8mo q8 hours PRN -- New onset psychosis w/u: Negative noncontrast head CT on 11/13/2021, Hep C Ab positive; ESR 2, ceruloplasmin 20.5, HIV nonreactive, B12 348, RPR nonreactive, ANA negative, TSH 0.667, UDS positive for THC, alcohol less than 10, BMP within normal limits other than a glucose of 123, white count 13.1 trending down to 10.2, AST elevated at 62 and ALT elevated at 103, B1 125.3, Heavy metals negative --Antipsychotic lab monitoring: EKG: QTC 408ms on 146m23, A1c 5.5, Lipids WNL             -- Encouraged patient to participate in unit milieu and in scheduled group therapies                EtoH use (r/o alcohol use d/o) Cocaine use - episodic (r/o stimulant use d/o) 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 Medical Issues Being Addressed:              Tobacco Use Disorder             -- Nicotine patch 21mg/24 ho51mordered, scheduled             -- Smoking cessation encouraged               Hx of Gastric Ulcers -- Continue home Pepcid 20mg BID   39mC ab positive/Transaminitis -- Hep C RNA Quant lab-353,000 -- AST trending  up to 62> 94 and ALT up to 103> 109 (has been at AST 155 and ALT 228 2 years ago) with T bili 1.4 > 0.9 and indirect bili 1.3 > 0.8 - Previously Consulted GI, Dr. 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  - RUQ US * please [place] reading, recommend patient f/u OP as there is nothing acutely concerning, SW to schedule PCP   Leukocytosis  -- WBC on 11/29/21 10.9  - questionably elevated with Li use   4. Nicoletta Dressscharge Planning:              -- Social work and case management to assist with discharge planning and identification of hospital follow-up needs prior to discharge             -- Discharge Concerns: Need to establish a safety plan; Medication compliance and  effectiveness             -- Discharge Goals: Return home with outpatient referrals for mental health follow-up including medication management/psychotherapy  Signed: Merrily Brittle, DO Psychiatry Resident, PGY-1 [facility] 12/03/2021, 3:51 PM

## 2021-12-03 NOTE — B[facility] Group Notes (Signed)
Psychoeducational Group Note  Date:  11/19/2021 Time:  1300-1400   Group Topic/Focus: This is a continuation of the group from Saturday. Pt's have been asked to formulate a list of 30 positives about themselves. This list is to be read 2 times a day for 30 days, looking in a mirror. Changing patterns of negative self talk. Also discussed is the fact that there have been some people who hurt [place] in the past. We keep that memory alive within [place]. Ways to cope with this are discused   Participation Level:  Active  Participation Quality:  Appropriate  Affect:  sad  Cognitive:  Oriented  Insight: Improving  Engagement in Group:  Engaged  Modes of Intervention:  Activity, Discussion, Education, and Support  Additional Comments:  pts his energy as a 6/10. Comes to group and stated he did his homework. Listened intently to what was discussed in the group and nodded numerous times. Did not speak out .  Dione Housekeeper

## 2021-12-03 NOTE — Progress Notes (Signed)
Psychoeducational Group Note  Date:  12/03/2021 Time:  2015  Group Topic/Focus:  Wrap up group  Participation Level: Did Not Attend  Participation Quality:  Not Applicable  Affect:  Not Applicable  Cognitive:  Not Applicable  Insight:  Not Applicable  Engagement in Group: Not Applicable  Additional Comments:  Did not attend.   Marcille Buffy 12/03/2021, 9:51 PM

## 2021-12-04 ENCOUNTER — Encounter (HOSPITAL_COMMUNITY): Payer: Self-pay

## 2021-12-04 ENCOUNTER — Ambulatory Visit (HOSPITAL_COMMUNITY)
Admission: RE | Admit: 2021-12-04 | Discharge: 2021-12-04 | Disposition: A | Discharge status: Home / Self Care | Payer: Federal, State, Local not specified - Other | Attending: Psychiatry | Admitting: Psychiatry

## 2021-12-04 LAB — BASIC METABOLIC PANEL
Anion gap: 8 (ref 5–15)
BUN: 16 mg/dL (ref 6–20)
CO2: 28 mmol/L (ref 22–32)
Calcium: 9.6 mg/dL (ref 8.9–10.3)
Chloride: 100 mmol/L (ref 98–111)
Creatinine, Ser: 0.85 mg/dL (ref 0.61–1.24)
GFR, Estimated: >60 mL/min (ref >60)
Glucose, Bld: 93 mg/dL (ref 70–99)
Potassium: 4.6 mmol/L (ref 3.5–5.1)
Sodium: 136 mmol/L (ref 135–145)

## 2021-12-04 LAB — LITHIUM LEVEL: Lithium Lvl: 0.46 mmol/L — ABNORMAL LOW (ref 0.60–1.20)

## 2021-12-04 LAB — TSH: TSH: 3.293 u[IU]/mL (ref 0.350–4.500)

## 2021-12-04 MED ORDER — NICOTINE POLACRILEX 2 MG MT GUM
CHEWING_GUM | OROMUCOSAL | Status: AC
  Administered 2021-12-04: 2 mg
  Filled 2021-12-04: qty 1

## 2021-12-04 MED ORDER — QUETIAPINE FUMARATE 50 MG PO TABS: 50.0000 mg | ORAL_TABLET | Freq: Every day | ORAL | 0 refills | Status: DC

## 2021-12-04 MED ORDER — NICOTINE POLACRILEX 2 MG MT GUM
2.0000 mg | CHEWING_GUM | OROMUCOSAL | Status: DC | PRN
  Administered 2021-12-04: 2 mg via ORAL

## 2021-12-04 MED ORDER — LITHIUM CARBONATE 600 MG PO CAPS: 600.0000 mg | ORAL_CAPSULE | Freq: Two times a day (BID) | ORAL | 0 refills | Status: DC

## 2021-12-04 MED ORDER — QUETIAPINE FUMARATE 25 MG PO TABS: 25.0000 mg | ORAL_TABLET | Freq: Two times a day (BID) | ORAL | 0 refills | Status: DC

## 2021-12-04 MED ORDER — HALOPERIDOL 10 MG PO TABS: 10.0000 mg | ORAL_TABLET | Freq: Every day | ORAL | 0 refills | Status: DC

## 2021-12-04 MED ORDER — HALOPERIDOL 5 MG PO TABS: 5.0000 mg | ORAL_TABLET | Freq: Two times a day (BID) | ORAL | 0 refills | Status: DC

## 2021-12-04 MED ORDER — BENZTROPINE MESYLATE 0.5 MG PO TABS: 0.5000 mg | ORAL_TABLET | Freq: Two times a day (BID) | ORAL | 0 refills | Status: DC | PRN

## 2021-12-04 MED ORDER — NICOTINE 21 MG/24HR TD PT24: 21.0000 mg | MEDICATED_PATCH | Freq: Every day | TRANSDERMAL | 0 refills | Status: DC

## 2021-12-04 MED ORDER — ADULT MULTIVITAMIN W/MINERALS CH: 1.0000 | ORAL_TABLET | Freq: Every day | ORAL | 0 refills | Status: DC

## 2021-12-04 NOTE — BH IP Treatment Plan (Signed)
Interdisciplinary Treatment and Diagnostic Plan Update  12/04/2021 Time of Session: 10:40am BASILIOS BRABEC MRN: [id number]  Principal Diagnosis: Schizophrenia spectrum disorder with psychotic disorder type not yet determined [facility])  Secondary Diagnoses: Principal Problem:   Schizophrenia spectrum disorder with psychotic disorder type not yet determined ([place]) Active Problems:   Alcohol abuse   Cannabis abuse   Cocaine abuse, episodic use ([place])   Hepatitis C   Current Medications:  Current Facility-Administered Medications  Medication Dose Route Frequency Provider Last Rate Last Admin   acetaminophen (TYLENOL) tablet 650 mg  650 mg Oral Q6H PRN Prescilla Sours, PA-C   650 mg at 11/23/21 1330   albuterol (VENTOLIN HFA) 108 (90 Base) MCG/ACT inhaler 2 puff  2 puff Inhalation Q6H PRN Harlow Asa, MD   2 puff at 12/03/21 0826   alum & mag hydroxide-simeth (MAALOX/MYLANTA) 200-200-20 MG/5ML suspension 30 mL  30 mL Oral Q4H PRN Margorie John W, PA-C       benztropine (COGENTIN) tablet 0.5 mg  0.5 mg Oral BID PRN Harlow Asa, MD       diphenhydrAMINE (BENADRYL) capsule 50 mg  50 mg Oral Q8H PRN Margorie John W, PA-C   50 mg at 11/20/21 2223   Or   diphenhydrAMINE (BENADRYL) injection 50 mg  50 mg Intramuscular Q8H PRN Margorie John W, PA-C   50 mg at 11/19/21 1255   famotidine (PEPCID) tablet 20 mg  20 mg Oral BID Damita Dunnings B, MD   20 mg at 12/04/21 0730   haloperidol (HALDOL) tablet 10 mg  10 mg Oral QHS Nelda Marseille, Amy E, MD   10 mg at 12/03/21 2110   haloperidol (HALDOL) tablet 5 mg  5 mg Oral Q8H PRN Prescilla Sours, PA-C   5 mg at 11/18/21 2311   Or   haloperidol lactate (HALDOL) injection 5 mg  5 mg Intramuscular Q8H PRN Margorie John W, PA-C   5 mg at 11/19/21 1255   haloperidol (HALDOL) tablet 5 mg  5 mg Oral BID Nelda Marseille, Amy E, MD   5 mg at 12/04/21 0730   lithium carbonate capsule 600 mg  600 mg Oral BID WC Damita Dunnings B, MD   600 mg at 12/04/21 0730   LORazepam  (ATIVAN) tablet 1 mg  1 mg Oral Q8H PRN Damita Dunnings B, MD   1 mg at 12/03/21 2110   Or   LORazepam (ATIVAN) injection 2 mg  2 mg Intramuscular Q8H PRN Damita Dunnings B, MD       magnesium hydroxide (MILK OF MAGNESIA) suspension 30 mL  30 mL Oral Daily PRN Lovena Le, [name], PA-C       multivitamin with minerals tablet 1 tablet  1 tablet Oral Daily Nelda Marseille, Amy E, MD   1 tablet at 12/04/21 0730   nicotine (NICODERM CQ - dosed in mg/24 hours) patch 21 mg  21 mg Transdermal Daily Hill, Jackie Plum, MD   21 mg at 12/03/21 [id number]   nicotine polacrilex (NICORETTE) 2 MG gum            nicotine polacrilex (NICORETTE) gum 2 mg  2 mg Oral PRN Harlow Asa, MD   2 mg at 12/04/21 0731   QUEtiapine (SEROQUEL) tablet 25 mg  25 mg Oral BID Viann Fish E, MD   25 mg at 12/03/21 1707   QUEtiapine (SEROQUEL) tablet 50 mg  50 mg Oral QHS Damita Dunnings B, MD   50 mg at 12/03/21 2110   thiamine  tablet 100 mg  100 mg Oral Daily Nelda Marseille, Amy E, MD   100 mg at 12/04/21 0730   PTA Medications: Medications Prior to Admission  Medication Sig Dispense Refill Last Dose   famotidine (PEPCID) 20 MG tablet Take 1 tablet (20 mg total) by mouth 2 (two) times daily. (Patient not taking: Reported on 01/06/2016) 30 tablet 0    lansoprazole (PREVACID) 30 MG capsule Take 1 capsule (30 mg total) by mouth daily at 12 noon. (Patient not taking: Reported on 08/19/2019) 30 capsule 0    ondansetron (ZOFRAN) 4 MG tablet Take 1 tablet (4 mg total) by mouth every 8 (eight) hours as needed for nausea or vomiting. (Patient not taking: Reported on 08/19/2019) 12 tablet 0    sucralfate (CARAFATE) 1 g tablet Take 1 tablet (1 g total) by mouth 3 (three) times daily as needed (epigastric pain). (Patient not taking: Reported on 08/19/2019) 30 tablet 0     Patient Stressors: Marital or family conflict   Medication change or noncompliance    Patient Strengths: Psychologist, clinical for treatment/growth   Treatment  Modalities: Medication Management, Group therapy, Case management,  1 to 1 session with clinician, Psychoeducation, Recreational therapy.   Physician Treatment Plan for Primary Diagnosis: Schizophrenia spectrum disorder with psychotic disorder type not yet determined ([place]) Long Term Goal(s): Improvement in symptoms so as ready for discharge   Short Term Goals: Ability to identify changes in lifestyle to reduce recurrence of condition will improve Ability to verbalize feelings will improve Ability to disclose and discuss suicidal ideas Ability to identify triggers associated with substance abuse/mental health issues will improve  Medication Management: Evaluate patient's response, side effects, and tolerance of medication regimen.  Therapeutic Interventions: 1 to 1 sessions, Unit Group sessions and Medication administration.  Evaluation of Outcomes: Adequate for Discharge  Physician Treatment Plan for Secondary Diagnosis: Principal Problem:   Schizophrenia spectrum disorder with psychotic disorder type not yet determined ([place]) Active Problems:   Alcohol abuse   Cannabis abuse   Cocaine abuse, episodic use ([place])   Hepatitis C  Long Term Goal(s): Improvement in symptoms so as ready for discharge   Short Term Goals: Ability to identify changes in lifestyle to reduce recurrence of condition will improve Ability to verbalize feelings will improve Ability to disclose and discuss suicidal ideas Ability to identify triggers associated with substance abuse/mental health issues will improve     Medication Management: Evaluate patient's response, side effects, and tolerance of medication regimen.  Therapeutic Interventions: 1 to 1 sessions, Unit Group sessions and Medication administration.  Evaluation of Outcomes: Adequate for Discharge   RN Treatment Plan for Primary Diagnosis: Schizophrenia spectrum disorder with psychotic disorder type not yet determined ([place]) Long Term Goal(s):  Knowledge of disease and therapeutic regimen to maintain health will improve  Short Term Goals: Ability to remain free from injury will improve, Ability to verbalize frustration and anger appropriately will improve, Ability to demonstrate self-control, Ability to identify and develop effective coping behaviors will improve, and Compliance with prescribed medications will improve  Medication Management: RN will administer medications as ordered by provider, will assess and evaluate patient's response and provide education to patient for prescribed medication. RN will report any adverse and/or side effects to prescribing provider.  Therapeutic Interventions: 1 on 1 counseling sessions, Psychoeducation, Medication administration, Evaluate responses to treatment, Monitor vital signs and CBGs as ordered, Perform/monitor CIWA, COWS, AIMS and Fall Risk screenings as ordered, Perform wound care treatments as ordered.  Evaluation of Outcomes: Adequate for Discharge   LCSW Treatment Plan for Primary Diagnosis: Schizophrenia spectrum disorder with psychotic disorder type not yet determined ([place]) Long Term Goal(s): Safe transition to appropriate next level of care at discharge, Engage patient in therapeutic group addressing interpersonal concerns.  Short Term Goals: Engage patient in aftercare planning with referrals and resources, Increase social support, Increase ability to appropriately verbalize feelings, Identify triggers associated with mental health/substance abuse issues, and Increase skills for wellness and recovery  Therapeutic Interventions: Assess for all discharge needs, 1 to 1 time with Social worker, Explore available resources and support systems, Assess for adequacy in community support network, Educate family and significant other(s) on suicide prevention, Complete Psychosocial Assessment, Interpersonal group therapy.  Evaluation of Outcomes: Adequate for Discharge   Progress in  Treatment: Attending groups: No. Participating in groups: No. Taking medication as prescribed: Yes. Toleration medication: Yes. Family/Significant other contact made: Yes, individual(s) contacted:  mother Patient understands diagnosis: Yes. Discussing patient identified problems/goals with staff: Yes. Medical problems stabilized or resolved: Yes. Denies suicidal/homicidal ideation: Yes. Issues/concerns per patient self-inventory: No. Other: None   New problem(s) identified: No, Describe:  None   New Short Term/Long Term Goal(s):medication stabilization, elimination of SI thoughts, development of comprehensive mental wellness plan.    Patient Goals:   "To stop all bad thoughts and look forward to the future"   Discharge Plan or Barriers: Pt will follow up at [facility] for medication management and therapy.   Reason for Continuation of Hospitalization:  Medication stabilization   Estimated Length of Stay: 1 day   Scribe for Treatment Team: Vassie Moselle, LCSW 12/04/2021 11:03 AM
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
## 2021-12-04 NOTE — Discharge Summary (Addendum)
Physician Discharge Summary Note  Patient:  Jeffrey Holland is an 36 y.o., male MRN:  [id number] DOB:  1986/11/02 Patient phone:  [phone] (home)  Patient address:   [place] [id number],  Total Time spent with patient: 20 minutes  Date of Admission:  11/15/2021 Date of Discharge: 12/04/2021  Reason for Admission: Endorsing AVH and bizarre behavior  Principal Problem: Schizophrenia spectrum disorder with psychotic disorder type not yet determined [facility]) Discharge Diagnoses: Principal Problem:   Schizophrenia spectrum disorder with psychotic disorder type not yet determined ([place]) Active Problems:   Alcohol abuse   Cannabis abuse   Cocaine abuse, episodic use ([place])   Hepatitis C   Past Psychiatric History: Denies previous inpatient admissions, psychotropic meds, previous suicide attempts, or previous outpatient mental health treatment; h/o cutting at age 27  Past Medical History:  Past Medical History:  Diagnosis Date   Anxiety    Asthma    Panic    History reviewed. No pertinent surgical history. Family History: History reviewed. No pertinent family history. Family Psychiatric  History: Dad- Bipolar  and PTSD on Zoloft and Alprazolam Brother-bipolar, was hospitalized at [facility], died via suicide at 38 yo via [place]; Patient reports that mother has depression but denies substance abuse in the family Social History:  Social History   Substance and Sexual Activity  Alcohol Use Yes     Social History   Substance and Sexual Activity  Drug Use Yes   Types: Marijuana, Methamphetamines, Cocaine    Social History   Socioeconomic History   Marital status: Single    Spouse name: Not on file   Number of children: Not on file   Years of education: Not on file   Highest education level: Not on file  Occupational History   Not on file  Tobacco Use   Smoking status: Every Day    Packs/day: 1.00    Types: Cigarettes   Smokeless tobacco: Not on file   Vaping Use   Vaping Use: Every day  Substance and Sexual Activity   Alcohol use: Yes   Drug use: Yes    Types: Marijuana, Methamphetamines, Cocaine   Sexual activity: Yes    Birth control/protection: None  Other Topics Concern   Not on file  Social History Narrative   ** Merged History Encounter **       Social Determinants of Health   Financial Resource Strain: Not on file  Food Insecurity: Not on file  Transportation Needs: Not on file  Physical Activity: Not on file  Stress: Not on file  Social Connections: Not on file    Hospital Course:  Jeffrey Holland is a 36 yo patient w/ no known PPH who presents endorsing AVH, delusions, and depressed mood.  Patient was started on Zyprexa 5 mg nightly to address psychotic symptoms.  Medication was titrated up slowly to 10 mg bid.  Marginal improvement was noted when patient was on Zyprexa.  Unfortunately patient continued to endorse visual hallucinations of his deceased brother and auditory hallucinations.  Patient was also noted to respond to stimuli and continued to remain isolative and paranoid of both staff and patients.  Patient also continued to appear to have thought blocking.  Patient was started on Haldol to address continued symptoms.  Patient's Haldol was titrated up to 5 mg daily and 15 mg at night.  Patient was noted to have decrease in thought blocking as well as resolution of his visual hallucinations.  Patient also became less isolative  and his paranoia decreased although there continue to be a low level paranoia towards patient's hospitalization.  As patient's thought process became more goal oriented patient was able to communicate that he had been having mood dysregulation prior to onset of psychosis symptoms.  Early in hospitalization patient had been noted to be a bit labile going from tearful and dysphoric to apologetic and remorseful.  Patient was started on Remeron out of concern for possible depression and to address  patient's decreased sleep.  However patient's mood instability continued and it was decided to trial patient on lithium due to elevated liver enzymes secondary to hepatitis C.    GI was briefly consulted and RUQ [place] was obtained, and recommended that patient follow up outpatient as he appears to have chronic hepatitis C.  Patient was started on lithium 300 mg and titrated up to 300 mg twice daily.  Patient remained subtherapeutic.  Patient appeared less depressed although his lability continued as patient became more irritable and remorseful.  Lithium was titrated up to 600 mg twice daily.  Patient remained subtherapeutic on lithium level was checked however, patient appeared to be responding to medication as his mood appeared to stabilized.  Patient was able to attend group therapy and interact in the milieu appropriately.  Patient was transferred from high acuity hallway to a lower acuity setting, where did take some time for patient to readjust however he was able to integrate successfully.  Unfortunately patient continued to have a low level paranoia regarding hospitalization, and concerned that he would never be discharged.  Patient also continued to endorse auditory hallucinations although reporting that they were decreased from initial presentation.  Patient was started on Seroquel in addition to Haldol in an effort to target serotonin receptors.  Patient responded well and had cessation of auditory hallucinations, paranoia, and delusions with a total of 100 mg of Seroquel.  Patient's sleep is significantly improved when he was started on lithium however, when patient was transition to a lower acuity unit he began endorsing nightmares and strange dreams.  Patient endorsed that these nightmares were not all based at her previous experiences.  Patient's Remeron was discontinued as was his trazodone in an effort to address nightmares.  Patient continued to have documented appropriate sleep hours.  On day of  discharge patient was denying SI, HI and AVH.  Patient had not shown any of the symptoms in the past 48 hours and had also denied symptoms of thought insertion, thought broadcasting, thought manipulation, thought deletion, delusions or paranoia in the last 48 hours.  Patient's parents also endorse believe that patient has significantly improved during his hospitalization.  Patient also received repeat education on the importance of cessation of illicit substances and EtOH.  Patient endorsed understanding and intent to remain sober.   Provider spoke with mom and dad prior to patient discharge about patient medications. Patient and parents were educated separately on EPS symptoms to look for. Parents and patient were educated that patient will receive a Cogentin 0.5mg  BID PRN prescription should these symptoms arise and that patient should be taken to the ED for evaluation. Patient and parents indicated understanding. Patient and parents were also separately educated on patient's need for hydration while on Lithium especially in the setting of being acutely ill or working out. Patient and parents were educated on symptoms to look out for and that if these symptoms are seen patient will need to go to ED. Patient and parents indicated understanding. Patient and parents were also  educated that patient will likely need his A1c, Lipids, TSH, and WBC monitored as well while he is on these medications. Patient and parents indicated understanding and parents endorsed they intended to make sure patient was compliant with visits. Patient and parents were also educated that patient has a OP PCP appt to f/u regarding his Hepatitis C.   Physical Findings: AIMS: Facial and Oral Movements Muscles of Facial Expression: None, normal Lips and Perioral Area: None, normal Jaw: None, normal Tongue: None, normal,Extremity Movements Upper (arms, wrists, hands, fingers): None, normal Lower (legs, knees, ankles, toes): None,  normal, Trunk Movements Neck, shoulders, hips: None, normal, Overall Severity Severity of abnormal movements (highest score from questions above): None, normal Incapacitation due to abnormal movements: None, normal Patient's awareness of abnormal movements (rate only patient's report): No Awareness, Dental Status Current problems with teeth and/or dentures?: No Does patient usually wear dentures?: No  CIWA:  CIWA-Ar Total: 0 COWS:     Musculoskeletal: Strength & Muscle Tone: within normal limits Gait & Station: normal Patient leans: N/A     Presentation  General Appearance: Appropriate for Environment   Eye Contact:Good   Speech:clear, coherent, regular rate   Speech Volume:Normal   Mood and Affect  Mood:- euthymic, calm   Affect:Congruent     Thought Process  Thought Processes:Linear, goal directed   Orientation:Full (Time, Place and Person)   Thought Content:Logical - denies AVH, paranoia, delusions, ideas of reference, first rank symptoms - is not grossly responding to internal/external stimuli on exam   Hallucinations:Denied   Ideas of Reference:None   Suicidal Thoughts:Denied   Homicidal Thoughts:Denied   Sensorium  Memory:Immediate Fair; Recent Fair   Judgment:- Improving   Insight:- Improving     Executive Functions  Concentration:Fair   Attention Span:Fair   [place]   Language:Good     Psychomotor Activity  Psychomotor Activity:AIMS 0; no cogwheeling, no stiffness, no tremor   Assets  Assets:Communication Skills; Resilience     Sleep  Sleep:Sleep: Good    Physical Exam: Physical Exam Constitutional:      Appearance: Normal appearance.  HENT:     Head: Normocephalic and atraumatic.  Pulmonary:     Effort: Pulmonary effort is normal.  Skin:    General: Skin is dry.  Neurological:     Mental Status: He is alert and oriented to person, place, and time.   Review of Systems  Cardiovascular:   Negative for chest pain.  Psychiatric/Behavioral:  Negative for hallucinations and suicidal ideas. The patient is not nervous/anxious and does not have insomnia.   Blood pressure 108/74, pulse 84, temperature 97.7 F (36.5 C), temperature source Oral, resp. rate 18, height 5\' 6"  (1.676 m), weight 62.6 kg, SpO2 97 %. Body mass index is 22.27 kg/m.   Social History   Tobacco Use  Smoking Status Every Day   Packs/day: 1.00   Types: Cigarettes  Smokeless Tobacco Not on file   Tobacco Cessation:  A prescription for an FDA-approved tobacco cessation medication provided at discharge   Blood Alcohol level:  Lab Results  Component Value Date   [facility] <10 11/14/2021   ETH <10 [id number]    Metabolic Disorder Labs:  Lab Results  Component Value Date   HGBA1C 5.5 11/15/2021   MPG 111.15 11/15/2021   No results found for: PROLACTIN Lab Results  Component Value Date   CHOL 151 11/15/2021   TRIG 98 11/15/2021   HDL 42 11/15/2021   CHOLHDL 3.6 11/15/2021  VLDL 20 11/15/2021   LDLCALC 89 11/15/2021    See Psychiatric Specialty Exam and Suicide Risk Assessment completed by Attending Physician prior to discharge.  Discharge destination:  Home  Is patient on multiple antipsychotic therapies at discharge:  Yes,   Do you recommend tapering to monotherapy for antipsychotics?  Yes   Has Patient had three or more failed trials of antipsychotic monotherapy by history:  No  Recommended Plan for Multiple Antipsychotic Therapies: It is likely that patient could be tapered off of Seroquel in 1 to 3 months with maintenance on Haldol and lithium.  Patient is requiring a low dosage of Seroquel to address residual psychosis symptoms in an acute setting.  Patient's almost benefit from Haldol.   Allergies as of 12/04/2021   No Known Allergies      Medication List     STOP taking these medications    famotidine 20 MG tablet Commonly known as: PEPCID   lansoprazole 30 MG capsule Commonly  known as: Prevacid   ondansetron 4 MG tablet Commonly known as: ZOFRAN   sucralfate 1 g tablet Commonly known as: Carafate       TAKE these medications      Indication  benztropine 0.5 MG tablet Commonly known as: COGENTIN Take 1 tablet (0.5 mg total) by mouth 2 (two) times daily as needed for tremors (EPS).  Indication: Extrapyramidal Reaction caused by Medications   haloperidol 5 MG tablet Commonly known as: HALDOL Take 1 tablet (5 mg total) by mouth 2 (two) times daily.  Indication: Bipolar disorder   haloperidol 10 MG tablet Commonly known as: HALDOL Take 1 tablet (10 mg total) by mouth at bedtime.  Indication: Bipolar disorder   lithium 600 MG capsule Take 1 capsule (600 mg total) by mouth 2 (two) times daily with a meal.  Indication: Manic-Depression   multivitamin with minerals Tabs tablet Take 1 tablet by mouth daily. Start taking on: December 05, 2021  Indication: Nutrition   nicotine 21 mg/24hr patch Commonly known as: NICODERM CQ - dosed in mg/24 hours Place 1 patch (21 mg total) onto the skin daily. Start taking on: December 05, 2021  Indication: Nicotine Addiction   QUEtiapine 50 MG tablet Commonly known as: SEROQUEL Take 1 tablet (50 mg total) by mouth at bedtime.  Indication: Manic-Depression   QUEtiapine 25 MG tablet Commonly known as: SEROQUEL Take 1 tablet (25 mg total) by mouth 2 (two) times daily.  Indication: Manic-Depression        Follow-up Information     [facility]. Go to.   Specialty: Behavioral Health Why: Please go to this provider for therapy and medication management services during walk in hours:  Monday through Wednesday from 7:45 am to 11:00 am.  Services are provided on a first come, first served basis. Contact information: [address] [place] [address] [phone]        [facility]. Go on 12/07/2021.   Why: You have a hospital follow up  appointment for primary care services on 12/07/21 at 2:30 pm.  This appointment will be held in person. Contact information: [address] [place] [phone] [phone]                Follow-up recommendations:  Follow up recommendations: - Activity as tolerated. - Diet as recommended by PCP. - Keep all scheduled follow-up appointments as recommended.   Comments:  Patient is instructed to take all prescribed medications as recommended.  Report any side effects or adverse reactions to your outpatient psychiatrist. Patient is instructed to abstain from alcohol and illegal drugs while on prescription medications. In the event of worsening symptoms, patient is instructed to call the crisis hotline, 911, or go to the nearest emergency department for evaluation and treatment.    Signed:  PGY-2 Freida Busman, MD 12/04/2021, 3:30 PM

## 2021-12-04 NOTE — Progress Notes (Signed)
D: Pt A & O X 3. Denies SI, HI, AVH and pain at this time. D/C home as ordered. Picked up in lobby by his mother.  A: D/C instructions reviewed with pt including electronic prescriptions and follow up appointments; compliance encouraged. All belongings from locker 50 returned to pt at time of departure. Scheduled medications given with verbal education and effects monitored. Safety checks maintained without incident till time of d/c.  R: Pt receptive to care. Compliant with medications when offered. Denies adverse drug reactions when assessed. Verbalized understanding related to d/c instructions. Signed belonging sheet in agreement with items received from locker. Ambulatory with a steady gait. Appears to be in no physical distress at time of departure.

## 2021-12-04 NOTE — BH Assessment (Incomplete)
Notified by the [facility] that patient was in the building as a walk-in. Clinician completed a chart review and found that   Patient presented to [facility] as a walk-in accompanied by his mother. Clinician and providers went to complete his assessment.

## 2021-12-04 NOTE — Progress Notes (Signed)
°  [facility] Adult Case Management Discharge Plan :  Will you be returning to the same living situation after discharge:  Yes,  to home At discharge, do you have transportation home?: Yes,  parents to pick this patient up Do you have the ability to pay for your medications: No. Samples to be provided at discharge  Release of information consent forms completed and in the chart;  Patient's signature needed at discharge.  Patient to Follow up at:  Follow-up Information     [facility]. Go to.   Specialty: Behavioral Health Why: Please go to this provider for therapy and medication management services during walk in hours:  Monday through Wednesday from 7:45 am to 11:00 am.  Services are provided on a first come, first served basis. Contact information: [address] [place] [address] [phone]        [facility]. Go on 12/07/2021.   Why: You have a hospital follow up appointment for primary care services on 12/07/21 at 2:30 pm.  This appointment will be held in person. Contact information: [address] [place] [address] [phone]                Next level of care provider has access to [facility] Link:yes  Safety Planning and Suicide Prevention discussed: Yes,  with mother     Has patient been referred to the Quitline?: Patient refused referral  Patient has been referred for addiction treatment: Pt. refused referral  Otelia Santee, LCSW 12/04/2021, 9:52 AM

## 2021-12-04 NOTE — BH Assessment (Signed)
Notified by the [facility] Richelle Ito, RN) that patient was in the building as a walk-in. Clinician completed a chart review to find that patient was admitted to [facility] 11/15/21-12/04/21 and discharged   Patient presented to [facility] as a walk-in accompanied by his mother. Clinician and providers went to complete his assessment. Patient observed standing, his mother sitting. He immediately ask if he can leave. Patient refused the TTS/MSE. States, "I came here by mistake, please let me leave". The provider requested patient to sign the MSE with  the understanding that the MSE was offered, yet he refused.  Patient agreed to do so. He was walked out of the building along with his mother.

## 2021-12-05 NOTE — BHH Group Notes (Signed)
Spiritual care group on grief and loss facilitated by chaplain Dyanne Carrel, [facility]   Group Goal:   Support / Education around grief and loss   Members engage in facilitated group support and psycho-social education.   Group Description:   Following introductions and group rules, group members engaged in facilitated group dialog and support around topic of loss, with particular support around experiences of loss in their lives. Group Identified types of loss (relationships / self / things) and identified patterns, circumstances, and changes that precipitate losses. Reflected on thoughts / feelings around loss, normalized grief responses, and recognized variety in grief experience. Group noted Worden's four tasks of grief in discussion.   Group drew on Adlerian / Rogerian, narrative, MI,   Patient Progress: Patient attended group and showed engagement in conversation.  [address] Bcc Pager, [phone]

## 2021-12-07 ENCOUNTER — Ambulatory Visit: Payer: Self-pay | Attending: Physician Assistant | Admitting: Physician Assistant

## 2021-12-07 NOTE — Progress Notes (Deleted)
Patient ID: Jeffrey Holland, male   DOB: 06-21-1986, 36 y.o.   MRN: [id number]    Principal Problem: Schizophrenia spectrum disorder with psychotic disorder type not yet determined [facility]) Discharge Diagnoses: Principal Problem:   Schizophrenia spectrum disorder with psychotic disorder type not yet determined ([place]) Active Problems:   Alcohol abuse   Cannabis abuse   Cocaine abuse, episodic use ([place])   Hepatitis C     Past Psychiatric History: Denies previous inpatient admissions, psychotropic meds, previous suicide attempts, or previous outpatient mental health treatment; h/o cutting at age 36   Hospital Course:  Jeffrey Holland is a 36 yo patient w/ no known PPH who presents endorsing AVH, delusions, and depressed mood.  Patient was started on Zyprexa 5 mg nightly to address psychotic symptoms.  Medication was titrated up slowly to 10 mg bid.  Marginal improvement was noted when patient was on Zyprexa.  Unfortunately patient continued to endorse visual hallucinations of his deceased brother and auditory hallucinations.  Patient was also noted to respond to stimuli and continued to remain isolative and paranoid of both staff and patients.  Patient also continued to appear to have thought blocking.  Patient was started on Haldol to address continued symptoms.  Patient's Haldol was titrated up to 5 mg daily and 15 mg at night.  Patient was noted to have decrease in thought blocking as well as resolution of his visual hallucinations.  Patient also became less isolative and his paranoia decreased although there continue to be a low level paranoia towards patient's hospitalization.  As patient's thought process became more goal oriented patient was able to communicate that he had been having mood dysregulation prior to onset of psychosis symptoms.  Early in hospitalization patient had been noted to be a bit labile going from tearful and dysphoric to apologetic and remorseful.  Patient was started on  Remeron out of concern for possible depression and to address patient's decreased sleep.  However patient's mood instability continued and it was decided to trial patient on lithium due to elevated liver enzymes secondary to hepatitis C.     GI was briefly consulted and RUQ [place] was obtained, and recommended that patient follow up outpatient as he appears to have chronic hepatitis C.  Patient was started on lithium 300 mg and titrated up to 300 mg twice daily.  Patient remained subtherapeutic.  Patient appeared less depressed although his lability continued as patient became more irritable and remorseful.  Lithium was titrated up to 600 mg twice daily.  Patient remained subtherapeutic on lithium level was checked however, patient appeared to be responding to medication as his mood appeared to stabilized.  Patient was able to attend group therapy and interact in the milieu appropriately.  Patient was transferred from high acuity hallway to a lower acuity setting, where did take some time for patient to readjust however he was able to integrate successfully.  Unfortunately patient continued to have a low level paranoia regarding hospitalization, and concerned that he would never be discharged.  Patient also continued to endorse auditory hallucinations although reporting that they were decreased from initial presentation.  Patient was started on Seroquel in addition to Haldol in an effort to target serotonin receptors.  Patient responded well and had cessation of auditory hallucinations, paranoia, and delusions with a total of 100 mg of Seroquel.  Patient's sleep is significantly improved when he was started on lithium however, when patient was transition to a lower acuity unit he began endorsing nightmares and strange  dreams.  Patient endorsed that these nightmares were not all based at her previous experiences.  Patient's Remeron was discontinued as was his trazodone in an effort to address nightmares.  Patient  continued to have documented appropriate sleep hours.  On day of discharge patient was denying SI, HI and AVH.  Patient had not shown any of the symptoms in the past 48 hours and had also denied symptoms of thought insertion, thought broadcasting, thought manipulation, thought deletion, delusions or paranoia in the last 48 hours.  Patient's parents also endorse believe that patient has significantly improved during his hospitalization.  Patient also received repeat education on the importance of cessation of illicit substances and EtOH.  Patient endorsed understanding and intent to remain sober.     Provider spoke with mom and dad prior to patient discharge about patient medications. Patient and parents were educated separately on EPS symptoms to look for. Parents and patient were educated that patient will receive a Cogentin 0.5mg  BID PRN prescription should these symptoms arise and that patient should be taken to the ED for evaluation. Patient and parents indicated understanding. Patient and parents were also separately educated on patient's need for hydration while on Lithium especially in the setting of being acutely ill or working out. Patient and parents were educated on symptoms to look out for and that if these symptoms are seen patient will need to go to ED. Patient and parents indicated understanding. Patient and parents were also educated that patient will likely need his A1c, Lipids, TSH, and WBC monitored as well while he is on these medications. Patient and parents indicated understanding and parents endorsed they intended to make sure patient was compliant with visits. Patient and parents were also educated that patient has a OP PCP appt to f/u regarding his Hepatitis C.

## 2021-12-09 NOTE — Group Note (Signed)
Late entry Music Therapy Patient did not attend

## 2021-12-18 ENCOUNTER — Other Ambulatory Visit: Payer: Self-pay

## 2021-12-18 ENCOUNTER — Encounter (HOSPITAL_COMMUNITY): Payer: Self-pay | Admitting: Psychiatry

## 2021-12-18 ENCOUNTER — Ambulatory Visit (INDEPENDENT_AMBULATORY_CARE_PROVIDER_SITE_OTHER): Payer: No Payment, Other | Admitting: Psychiatry

## 2021-12-18 VITALS — BP 122/76 | HR 68 | Ht 66.0 in | Wt 147.0 lb

## 2021-12-18 DIAGNOSIS — F29 Unspecified psychosis not due to a substance or known physiological condition: Secondary | ICD-10-CM

## 2021-12-18 DIAGNOSIS — F5105 Insomnia due to other mental disorder: Secondary | ICD-10-CM

## 2021-12-18 MED ORDER — HALOPERIDOL 5 MG PO TABS: 5.0000 mg | ORAL_TABLET | Freq: Two times a day (BID) | ORAL | 0 refills | Status: DC

## 2021-12-18 MED ORDER — TRAZODONE HCL 50 MG PO TABS
50.0000 mg | ORAL_TABLET | Freq: Every evening | ORAL | 0 refills | Status: DC | PRN
Start: 2021-12-18 — End: 2022-01-15

## 2021-12-18 MED ORDER — ADULT MULTIVITAMIN W/MINERALS CH: 1.0000 | ORAL_TABLET | Freq: Every day | ORAL | 0 refills | Status: AC

## 2021-12-18 MED ORDER — HALOPERIDOL 10 MG PO TABS: 10.0000 mg | ORAL_TABLET | Freq: Every day | ORAL | 0 refills | Status: DC

## 2021-12-18 MED ORDER — BENZTROPINE MESYLATE 0.5 MG PO TABS: 0.5000 mg | ORAL_TABLET | Freq: Two times a day (BID) | ORAL | 0 refills | Status: DC | PRN

## 2021-12-18 NOTE — Progress Notes (Signed)
Psychiatric Initial Adult Assessment   Patient Identification: Jeffrey Holland MRN:  [id number] Date of Evaluation:  12/19/2021 Referral Source: Parents Chief Complaint:   Chief Complaint   WALK-IN    Visit Diagnosis:    ICD-10-CM   1. Schizophrenia spectrum disorder with psychotic disorder type not yet determined ([place])  F29     2. Insomnia related to another mental disorder  F51.05       History of Present Illness: Jeffrey Holland is a 36 year old male presenting to [facility] for initial evaluation and medication management. Patient reports "I lost all of my medication and I want to get back on my medication". Patient reports having visions of his "dead brother". Patient reports that his brother's death was due to suicide but he believes that "cops covered up a crime scene". Patient reports visual and auditory hallucinations that started a few weeks ago. Patient states that "voices are 2-3 people that I talk to" and voices are overbearing. Patient reports that auditory hallucinations are not commanding in nature. Daylan declined to describe visual hallucination and stated "they're inappropriate". Patient stated "I think I have psychic tendencies" and stated voices said "sorry it's not your dead brother its a psychic".. He reports a recent inpatient psychiatric hospitalization that lasted two weeks. Patient reported discharging a week ago but has not been compliant with his medications since discharge. Patient reported that his hallucinations increased when he was taking the medications. Per patient's medical record, Jeffrey Holland has a diagnosis of schizophrenia spectrum disorder. He is prescribed Haldol, Trazodone, Seroquel, Cogentin, and lithium. He reports marijuana use this morning and drinking 24 oz. cans of beer yesterday. Patient reported a history of cocaine and methamphetamine abuse but states that he has abstained from both illicit substances, with his  last use being one month ago. Patient reported sleeping eight hours last night but sleep was interrupted due to nightmares of his dead brother. [name] is alert and oriented x 4. He appears older than stated age. He is dressed appropriately for the weather but is malodorous. His speech is clear and coherent. His thought process is delusional. He denies suicidal or homicidal ideations.   Associated Signs/Symptoms: Depression Symptoms:  insomnia, anxiety, (Hypo) Manic Symptoms:  Distractibility, Elevated Mood, Flight of Ideas, Hallucinations, Anxiety Symptoms:  Social Anxiety, Psychotic Symptoms:  Hallucinations: Auditory Visual Paranoia, PTSD Symptoms: Negative  Past Psychiatric History: Psychosis  Previous Psychotropic Medications: Yes   Substance Abuse History in the last 12 months:  Yes.     Consequences of Substance Abuse: Negative  Past Medical History:  Past Medical History:  Diagnosis Date   Anxiety    Asthma    Panic    No past surgical history on file.  Family Psychiatric History: None known  Family History: No family history on file.  Social History:   Social History   Socioeconomic History   Marital status: Single    Spouse name: Not on file   Number of children: Not on file   Years of education: Not on file   Highest education level: Not on file  Occupational History   Not on file  Tobacco Use   Smoking status: Every Day    Packs/day: 1.00    Types: Cigarettes   Smokeless tobacco: Not on file  Vaping Use   Vaping Use: Every day  Substance and Sexual Activity   Alcohol use: Yes   Drug use: Yes    Types: Marijuana, Methamphetamines, Cocaine   Sexual activity: Yes  Birth control/protection: None  Other Topics Concern   Not on file  Social History Narrative   ** Merged History Encounter **       Social Determinants of Health   Financial Resource Strain: Not on file  Food Insecurity: Not on file  Transportation Needs: Not on file   Physical Activity: Not on file  Stress: Not on file  Social Connections: Not on file    Additional Social History: [name]'s highest grade completed was 6th grade and he works as a Curator. Patient lives with his biological parents and reports he has friends and a good social support system. Patient reports that he has three siblings, one living sister and brother and one deceased brother, who died in 5 from suicide.   Allergies:  No Known Allergies  Metabolic Disorder Labs: Lab Results  Component Value Date   HGBA1C 5.5 11/15/2021   MPG 111.15 11/15/2021   No results found for: PROLACTIN Lab Results  Component Value Date   CHOL 151 11/15/2021   TRIG 98 11/15/2021   HDL 42 11/15/2021   CHOLHDL 3.6 11/15/2021   VLDL 20 11/15/2021   LDLCALC 89 11/15/2021   Lab Results  Component Value Date   TSH 3.293 12/04/2021    Therapeutic Level Labs: Lab Results  Component Value Date   LITHIUM 0.46 (L) 12/04/2021   No results found for: CBMZ No results found for: VALPROATE  Current Medications: Current Outpatient Medications  Medication Sig Dispense Refill   traZODone (DESYREL) 50 MG tablet Take 1 tablet (50 mg total) by mouth at bedtime as needed for sleep. 30 tablet 0   benztropine (COGENTIN) 0.5 MG tablet Take 1 tablet (0.5 mg total) by mouth 2 (two) times daily as needed for tremors (EPS). 30 tablet 0   haloperidol (HALDOL) 10 MG tablet Take 1 tablet (10 mg total) by mouth at bedtime. 30 tablet 0   haloperidol (HALDOL) 5 MG tablet Take 1 tablet (5 mg total) by mouth 2 (two) times daily. 60 tablet 0   Multiple Vitamin (MULTIVITAMIN WITH MINERALS) TABS tablet Take 1 tablet by mouth daily. 30 tablet 0   No current facility-administered medications for this visit.    Musculoskeletal: Strength & Muscle Tone: within normal limits Gait & Station: normal Patient leans: N/A  Psychiatric Specialty Exam: Review of Systems  Psychiatric/Behavioral:  Positive for hallucinations  and sleep disturbance. Negative for agitation, behavioral problems, confusion, decreased concentration, dysphoric mood, self-injury and suicidal ideas.   All other systems reviewed and are negative.  Blood pressure 122/76, pulse 68, height 5\' 6"  (1.676 m), weight 147 lb (66.7 kg).Body mass index is 23.73 kg/m.  General Appearance: Fairly Groomed  Eye Contact:  Good  Speech:  Clear and Coherent and Normal Rate  Volume:  Normal  Mood:  Anxious  Affect:  Congruent  Thought Process:  Goal Directed  Orientation:  Full (Time, Place, and Person)  Thought Content:  Delusions and Hallucinations: Auditory Visual  Suicidal Thoughts:  No  Homicidal Thoughts:  No  Memory:  Immediate;   Good Recent;   Good Remote;   Good  Judgement:  Fair  Insight:  Good  Psychomotor Activity:  Mannerisms  Concentration:  Concentration: Fair and Attention Span: Fair  Recall:  Good  Fund of Knowledge:Fair  Language: Good  Akathisia:  Negative  Handed:  Right  AIMS (if indicated):  done  Assets:  Communication Skills Desire for Improvement Housing Social Support  ADL's:  Intact  Cognition: WNL  Sleep:  Fair  Screenings: AIMS    Flowsheet Row Admission (Discharged) from 11/15/2021 in [place] [facility]  AIMS Total Score 0      AUDIT    Flowsheet Row Admission (Discharged) from 11/15/2021 in [place] [facility]  Alcohol Use Disorder Identification Test Final Score (AUDIT) 6      PHQ2-9    [place] Office Visit from 12/18/2021 in [facility] ED from 11/13/2021 in [place]  PHQ-2 Total Score 0 0  PHQ-9 Total Score -- 3      [place] Office Visit from 12/18/2021 in [facility] Admission (Discharged) from 11/15/2021 in [place] [facility] ED from 11/14/2021 in [place] No Risk No Risk No Risk       Assessment and Plan: Jayden Shultz is a 36 year old male presenting to [facility] for initial evaluation. Patient reports medication noncompliance and he is positive for auditory and visual hallucinations with delusional thought. He reports marijuana use prior to his arrival. Unable to determine if symptoms upon presentation is substance induced, at this time. Patient reports increased psychosis when taking his medications. He is in agreement with restarting Haldol for symptom management. Seroquel and Lithium discontinued. Cogentin previously prescribed for EPS as needed. Patient's Haldol, Trazodone, and Cogentin e-scribed to his preferred pharmacy. Smoking cessation and harm reduction discussed as an alcohol prevention strategy. Patient will return to care in 30 days for reassessment.   Diagnosis/Treatment:     Schizophrenia spectrum disorder F29 Medication: Haldol 5 mg twice daily and 10 mg at bedtime Insomnia related to another mental d/o F51.05 Medication: Trazodone 50 mg at bedtime as needed for sleep   Meds ordered this encounter  Medications   benztropine (COGENTIN) 0.5 MG tablet    Sig: Take 1 tablet (0.5 mg total) by mouth 2 (two) times daily as needed for tremors (EPS).    Dispense:  30 tablet    Refill:  0    Order Specific Question:   Supervising Provider    Answer:   Hampton Abbot [[id number]]   haloperidol (HALDOL) 10 MG tablet    Sig: Take 1 tablet (10 mg total) by mouth at bedtime.    Dispense:  30 tablet    Refill:  0    Order Specific Question:   Supervising Provider    Answer:   Hampton Abbot [[id number]]   haloperidol (HALDOL) 5 MG tablet    Sig: Take 1 tablet (5 mg total) by mouth 2 (two) times daily.    Dispense:  60 tablet    Refill:  0    Order Specific Question:   Supervising Provider    Answer:   Hampton Abbot [[id number]]   Multiple Vitamin (MULTIVITAMIN WITH MINERALS) TABS tablet    Sig: Take 1  tablet by mouth daily.    Dispense:  30 tablet    Refill:  0    Order Specific Question:   Supervising Provider    Answer:   Hampton Abbot [id number]   traZODone (DESYREL) 50 MG tablet    Sig: Take 1 tablet (50 mg total) by mouth at bedtime as needed for sleep.    Dispense:  30 tablet    Refill:  0    Order Specific Question:   Supervising Provider    Answer:   Hampton Abbot [address]  NP 2/7/20231:38 AM

## 2022-01-15 ENCOUNTER — Encounter (HOSPITAL_COMMUNITY): Payer: Self-pay | Admitting: Psychiatry

## 2022-01-15 ENCOUNTER — Other Ambulatory Visit: Payer: Self-pay

## 2022-01-15 ENCOUNTER — Ambulatory Visit (INDEPENDENT_AMBULATORY_CARE_PROVIDER_SITE_OTHER): Payer: No Payment, Other | Admitting: Psychiatry

## 2022-01-15 VITALS — BP 120/87 | HR 71 | Ht 66.0 in | Wt 153.0 lb

## 2022-01-15 DIAGNOSIS — F29 Unspecified psychosis not due to a substance or known physiological condition: Secondary | ICD-10-CM | POA: Diagnosis not present

## 2022-01-15 DIAGNOSIS — F411 Generalized anxiety disorder: Secondary | ICD-10-CM

## 2022-01-15 DIAGNOSIS — F5105 Insomnia due to other mental disorder: Secondary | ICD-10-CM | POA: Diagnosis not present

## 2022-01-15 MED ORDER — HALOPERIDOL 10 MG PO TABS: 10.0000 mg | ORAL_TABLET | Freq: Every day | ORAL | 2 refills | Status: DC

## 2022-01-15 MED ORDER — HYDROXYZINE HCL 50 MG PO TABS: 50.0000 mg | ORAL_TABLET | Freq: Three times a day (TID) | ORAL | 2 refills | Status: DC | PRN

## 2022-01-15 MED ORDER — BENZTROPINE MESYLATE 1 MG PO TABS: 1.0000 mg | ORAL_TABLET | Freq: Two times a day (BID) | ORAL | 2 refills | Status: DC

## 2022-01-15 MED ORDER — BENZTROPINE MESYLATE 0.5 MG PO TABS: 0.5000 mg | ORAL_TABLET | Freq: Two times a day (BID) | ORAL | 2 refills | Status: DC | PRN

## 2022-01-15 MED ORDER — TRAZODONE HCL 100 MG PO TABS: 100.0000 mg | ORAL_TABLET | Freq: Every evening | ORAL | 2 refills | Status: DC | PRN

## 2022-01-15 MED ORDER — HALOPERIDOL 5 MG PO TABS: 5.0000 mg | ORAL_TABLET | Freq: Two times a day (BID) | ORAL | 2 refills | Status: DC

## 2022-01-15 NOTE — Progress Notes (Signed)
BH MD/PA/NP OP Progress Note  01/15/2022 11:35 AM THACKERY PORTUGAL  MRN:  [id number]  Chief Complaint:  Chief Complaint  Patient presents with   Medication Management    IN PERSON  F/U MM   HPI: Jeffrey Holland is a 36 year old male presenting to [facility] behavioral health outpatient for a follow-up psychiatric evaluation.  A psychiatric history of schizophrenia, alcohol abuse, cannabis abuse, cocaine abuse, and anxiety.  His symptoms are managed with Cogentin 0.5 mg twice daily, Haldol 5 mg twice daily and 10 mg at bedtime, and trazodone 50 mg at bedtime for sleep.  Patient reports compliance with medication regimen and denies adverse effects.  Patient reports difficulty sleeping at night and feeling the need to stand up and move periodically.  Patient also reports anxiety throughout the day. He is alert and oriented x 4, pleasant, and willing to engage.  He is dressed appropriately for the weather and fairly groomed.  He reports an anxious mood and good appetite.  Patient denies suicidal homicidal ideations.  He reports auditory hallucinations persist but are manageable.  Auditory hallucinations continues to be related to his deceased brother.  Visit Diagnosis:    ICD-10-CM   1. Insomnia due to mental condition  F51.05     2. Schizophrenia spectrum disorder with psychotic disorder type not yet determined (HCC)  F29     3. Anxiety state  F41.1       Past Psychiatric History: Schizophrenia, alcohol abuse, cocaine abuse, cannabis abuse and anxiety  Past Medical History:  Past Medical History:  Diagnosis Date   Anxiety    Asthma    Panic    No past surgical history on file.  Family Psychiatric History:   Family History: No family history on file.  Social History:  Social History   Socioeconomic History   Marital status: Single    Spouse name: Not on file   Number of children: Not on file   Years of education: Not on file   Highest education level: Not on file   Occupational History   Not on file  Tobacco Use   Smoking status: Every Day    Packs/day: 1.00    Types: Cigarettes   Smokeless tobacco: Not on file  Vaping Use   Vaping Use: Every day  Substance and Sexual Activity   Alcohol use: Yes   Drug use: Yes    Types: Marijuana, Methamphetamines, Cocaine   Sexual activity: Yes    Birth control/protection: None  Other Topics Concern   Not on file  Social History Narrative   ** Merged History Encounter **       Social Determinants of Health   Financial Resource Strain: Not on file  Food Insecurity: Not on file  Transportation Needs: Not on file  Physical Activity: Not on file  Stress: Not on file  Social Connections: Not on file    Allergies: No Known Allergies  Metabolic Disorder Labs: Lab Results  Component Value Date   HGBA1C 5.5 11/15/2021   MPG 111.15 11/15/2021   No results found for: PROLACTIN Lab Results  Component Value Date   CHOL 151 11/15/2021   TRIG 98 11/15/2021   HDL 42 11/15/2021   CHOLHDL 3.6 11/15/2021   VLDL 20 11/15/2021   LDLCALC 89 11/15/2021   Lab Results  Component Value Date   TSH 3.293 12/04/2021   TSH 2.739 11/29/2021    Therapeutic Level Labs: Lab Results  Component Value Date   LITHIUM 0.46 (L) 12/04/2021  LITHIUM 0.25 (L) 11/29/2021   No results found for: VALPROATE No components found for:  CBMZ  Current Medications: Current Outpatient Medications  Medication Sig Dispense Refill   hydrOXYzine (ATARAX) 50 MG tablet Take 1 tablet (50 mg total) by mouth 3 (three) times daily as needed for anxiety. 90 tablet 2   Multiple Vitamin (MULTIVITAMIN WITH MINERALS) TABS tablet Take 1 tablet by mouth daily. 30 tablet 0   benztropine (COGENTIN) 0.5 MG tablet Take 1 tablet (0.5 mg total) by mouth 2 (two) times daily as needed for tremors (EPS). 30 tablet 2   haloperidol (HALDOL) 10 MG tablet Take 1 tablet (10 mg total) by mouth at bedtime. 30 tablet 2   haloperidol (HALDOL) 5 MG tablet  Take 1 tablet (5 mg total) by mouth 2 (two) times daily. 60 tablet 2   traZODone (DESYREL) 100 MG tablet Take 1 tablet (100 mg total) by mouth at bedtime as needed for sleep. 30 tablet 2   No current facility-administered medications for this visit.     Musculoskeletal: Strength & Muscle Tone: within normal limits Gait & Station: normal Patient leans: N/A  Psychiatric Specialty Exam: Review of Systems  Psychiatric/Behavioral:  Positive for hallucinations and sleep disturbance. Negative for self-injury and suicidal ideas. The patient is nervous/anxious.   All other systems reviewed and are negative.  Blood pressure 120/87, pulse 71, height 5\' 6"  (1.676 m), weight 153 lb (69.4 kg).Body mass index is 24.69 kg/m.  General Appearance: Fairly Groomed  Eye Contact:  Good  Speech:  Clear and Coherent  Volume:  Normal  Mood:  Anxious  Affect:  Appropriate and Congruent  Thought Process:  Goal Directed  Orientation:  Full (Time, Place, and Person)  Thought Content: Logical and Hallucinations: Auditory   Suicidal Thoughts:  No  Homicidal Thoughts:  No  Memory:  Immediate;   Good Recent;   Good Remote;   Good  Judgement:  Good  Insight:  Good  Psychomotor Activity:  Restlessness  Concentration:  Concentration: Good and Attention Span: Good  Recall:  Good  Fund of Knowledge: Good  Language: Good  Akathisia:  Yes  Handed:  Right  AIMS (if indicated): done  Assets:  Communication Skills Desire for Improvement  ADL's:  Intact  Cognition: WNL  Sleep:  Poor   Screenings: AIMS    Flowsheet Row [facility]  AIMS Total Score 0      AUDIT    Flowsheet Row Ad[facility]  Alcohol Use Disorder Identification Test Final Score (AUDIT) 6      PHQ2-9    Flowsheet Row [facility]18/2021 [facility]  ED from 11/13/2021 in [facility] EM[facility]GENCY DEPARTMENT  PHQ-2 Total Score 0 0  PHQ-9 Total Score -- 3      Flowsheet Row Office Visit from 12/18/2021 in [facility] Admission (Discharged) [facility] ED from 11/14/2021 in [facility]GENCY DEPART[facility]  C-SSRS RISK CATEGORY No Risk No Risk No Risk        Assessment and Plan: Jeffrey Holland is a 36 year old male presenting to [facility] behavioral health outpatient for a follow-up psychiatric evaluation.  A psychiatric history of schizophrenia, alcohol abuse, cannabis abuse, cocaine abuse, and anxiety.  His symptoms are managed with Cogentin 0.5 mg twice daily, Haldol 5 mg twice daily  and 10 mg at bedtime, and trazodone 50 mg at bedtime for sleep.  Patient reports compliance with medication regimen and denies adverse effects.  Patient reports difficulty sleeping at night and feeling the need to stand up and move periodically.  Patient also reports anxiety throughout the day.  Patient in agreement with increasing trazodone to 100 mg at bedtime for insomnia.  Patient also in agreement with increasing Cogentin to 1 mg twice daily.  New order for hydroxyzine 50 mg 3 times daily as needed for anxiety completed.  Medication benefits versus risk discussed.  Individual therapy recommended.  Patient declined psychotherapy at this time.  Collaboration of Care: Collaboration of Care: Medication Management AEB medications E scribed to patient's preferred pharmacy  1. Insomnia due to mental condition Increased- traZODone (DESYREL) 100 MG tablet; Take 1 tablet (100 mg total) by mouth at bedtime as needed for sleep.  Dispense: 30 tablet; Refill: 2  2. Schizophrenia spectrum disorder with psychotic disorder type not yet determined (HCC) - haloperidol (HALDOL) 10 MG tablet; Take 1 tablet (10 mg total) by mouth at bedtime.  Dispense: 30 tablet;  Refill: 2 - haloperidol (HALDOL) 5 MG tablet; Take 1 tablet (5 mg total) by mouth 2 (two) times daily.  Dispense: 60 tablet; Refill: 2 Increased- benztropine (COGENTIN) 1 MG tablet; Take 1 tablet (1 mg total) by mouth 2 (two) times daily.  Dispense: 60 tablet; Refill: 2  3. Anxiety state - hydrOXYzine (ATARAX) 50 MG tablet; Take 1 tablet (50 mg total) by mouth 3 (three) times daily as needed for anxiety.  Dispense: 90 tablet; Refill: 2  Other orders Follow-up in 6 weeks   Patient/Guardian was advised Release of Information must be obtained prior to any record release in order to collaborate their care with an outside provider. Patient/Guardian was advised if they have not already done so to contact the registration department to sign all necessary forms in order for [place] to release information regarding their care.   Consent: Patient/Guardian gives verbal consent for treatment and assignment of benefits for services provided during this visit. Patient/Guardian expressed understanding and agreed to proceed.    Mcneil Sober, NP 01/15/2022, 11:35 AM

## 2022-02-12 ENCOUNTER — Encounter (HOSPITAL_COMMUNITY): Payer: No Payment, Other | Admitting: Psychiatry

## 2022-02-20 ENCOUNTER — Encounter (HOSPITAL_COMMUNITY): Payer: Self-pay

## 2022-02-20 ENCOUNTER — Telehealth (HOSPITAL_COMMUNITY): Payer: No Payment, Other | Admitting: Psychiatry

## 2022-02-24 ENCOUNTER — Encounter (HOSPITAL_COMMUNITY): Payer: Self-pay

## 2022-02-24 ENCOUNTER — Other Ambulatory Visit: Payer: Self-pay

## 2022-02-24 ENCOUNTER — Emergency Department (HOSPITAL_COMMUNITY)
Admission: EM | Admit: 2022-02-24 | Discharge: 2022-02-27 | Disposition: A | Discharge status: Other Acute Inpatient Hospital | Payer: Self-pay | Attending: Emergency Medicine | Admitting: Emergency Medicine

## 2022-02-24 DIAGNOSIS — Y900 Blood alcohol level of less than 20 mg/100 ml: Secondary | ICD-10-CM | POA: Insufficient documentation

## 2022-02-24 DIAGNOSIS — F209 Schizophrenia, unspecified: Secondary | ICD-10-CM | POA: Insufficient documentation

## 2022-02-24 DIAGNOSIS — F29 Unspecified psychosis not due to a substance or known physiological condition: Secondary | ICD-10-CM | POA: Diagnosis present

## 2022-02-24 DIAGNOSIS — D72829 Elevated white blood cell count, unspecified: Secondary | ICD-10-CM | POA: Insufficient documentation

## 2022-02-24 DIAGNOSIS — Z20822 Contact with and (suspected) exposure to covid-19: Secondary | ICD-10-CM | POA: Insufficient documentation

## 2022-02-24 DIAGNOSIS — Z79899 Other long term (current) drug therapy: Secondary | ICD-10-CM | POA: Insufficient documentation

## 2022-02-24 DIAGNOSIS — Z046 Encounter for general psychiatric examination, requested by authority: Secondary | ICD-10-CM

## 2022-02-24 LAB — COMPREHENSIVE METABOLIC PANEL
ALT: 46 U/L — ABNORMAL HIGH (ref 0–44)
AST: 33 U/L (ref 15–41)
Albumin: 4.2 g/dL (ref 3.5–5.0)
Alkaline Phosphatase: 50 U/L (ref 38–126)
Anion gap: 10 (ref 5–15)
BUN: 21 mg/dL — ABNORMAL HIGH (ref 6–20)
CO2: 20 mmol/L — ABNORMAL LOW (ref 22–32)
Calcium: 9.2 mg/dL (ref 8.9–10.3)
Chloride: 106 mmol/L (ref 98–111)
Creatinine, Ser: 1.04 mg/dL (ref 0.61–1.24)
GFR, Estimated: >60 mL/min (ref >60)
Glucose, Bld: 92 mg/dL (ref 70–99)
Potassium: 3.6 mmol/L (ref 3.5–5.1)
Sodium: 136 mmol/L (ref 135–145)
Total Bilirubin: 0.8 mg/dL (ref 0.3–1.2)
Total Protein: 7.8 g/dL (ref 6.5–8.1)

## 2022-02-24 LAB — RESP PANEL BY RT-PCR (FLU A&B, COVID) ARPGX2
Influenza A by PCR: NEGATIVE
Influenza B by PCR: NEGATIVE
SARS Coronavirus 2 by RT PCR: NEGATIVE

## 2022-02-24 LAB — CBC WITH DIFFERENTIAL/PLATELET
Abs Immature Granulocytes: 0.09 10*3/uL — ABNORMAL HIGH (ref 0.00–0.07)
Basophils Absolute: 0.1 10*3/uL (ref 0.0–0.1)
Basophils Relative: 0 %
Eosinophils Absolute: 0.1 10*3/uL (ref 0.0–0.5)
Eosinophils Relative: 0 %
HCT: 44 % (ref 39.0–52.0)
Hemoglobin: 15.5 g/dL (ref 13.0–17.0)
Immature Granulocytes: 1 %
Lymphocytes Relative: 13 %
Lymphs Abs: 2.6 10*3/uL (ref 0.7–4.0)
MCH: 34.3 pg — ABNORMAL HIGH (ref 26.0–34.0)
MCHC: 35.2 g/dL (ref 30.0–36.0)
MCV: 97.3 fL (ref 80.0–100.0)
Monocytes Absolute: 1.4 10*3/uL — ABNORMAL HIGH (ref 0.1–1.0)
Monocytes Relative: 7 %
Neutro Abs: 15.4 10*3/uL — ABNORMAL HIGH (ref 1.7–7.7)
Neutrophils Relative %: 79 %
Platelets: 333 10*3/uL (ref 150–400)
RBC: 4.52 MIL/uL (ref 4.22–5.81)
RDW: 12.5 % (ref 11.5–15.5)
WBC: 19.5 10*3/uL — ABNORMAL HIGH (ref 4.0–10.5)
nRBC: 0 % (ref 0.0–0.2)

## 2022-02-24 LAB — RAPID URINE DRUG SCREEN, HOSP PERFORMED
Amphetamines: NOT DETECTED
Barbiturates: NOT DETECTED
Benzodiazepines: NOT DETECTED
Cocaine: NOT DETECTED
Opiates: NOT DETECTED
Tetrahydrocannabinol: POSITIVE — AB

## 2022-02-24 LAB — SALICYLATE LEVEL: Salicylate Lvl: <7 mg/dL — ABNORMAL LOW (ref 7.0–30.0)

## 2022-02-24 LAB — ETHANOL: Alcohol, Ethyl (B): <10 mg/dL (ref <10)

## 2022-02-24 LAB — ACETAMINOPHEN LEVEL: Acetaminophen (Tylenol), Serum: <10 ug/mL — ABNORMAL LOW (ref 10–30)

## 2022-02-24 MED ORDER — TRAZODONE HCL 100 MG PO TABS
100.0000 mg | ORAL_TABLET | Freq: Every evening | ORAL | Status: DC | PRN
  Administered 2022-02-25 (×2): 100 mg via ORAL
  Filled 2022-02-24 (×2): qty 1

## 2022-02-24 MED ORDER — NICOTINE 21 MG/24HR TD PT24
21.0000 mg | MEDICATED_PATCH | Freq: Once | TRANSDERMAL | Status: AC
  Administered 2022-02-24: 21 mg via TRANSDERMAL
  Filled 2022-02-24: qty 1

## 2022-02-24 MED ORDER — ACETAMINOPHEN 500 MG PO TABS
1000.0000 mg | ORAL_TABLET | Freq: Once | ORAL | Status: AC
  Administered 2022-02-24: 1000 mg via ORAL
  Filled 2022-02-24: qty 2

## 2022-02-24 MED ORDER — DIPHENHYDRAMINE HCL 25 MG PO CAPS
25.0000 mg | ORAL_CAPSULE | Freq: Three times a day (TID) | ORAL | Status: DC | PRN
  Administered 2022-02-24 – 2022-02-25 (×2): 25 mg via ORAL
  Filled 2022-02-24 (×2): qty 1

## 2022-02-24 MED ORDER — LORAZEPAM 1 MG PO TABS
1.0000 mg | ORAL_TABLET | Freq: Once | ORAL | Status: AC
  Administered 2022-02-24: 1 mg via ORAL
  Filled 2022-02-24: qty 1

## 2022-02-24 MED ORDER — OLANZAPINE 10 MG PO TBDP
10.0000 mg | ORAL_TABLET | Freq: Every day | ORAL | Status: DC
Start: 2022-02-24 — End: 2022-02-27
  Administered 2022-02-24 – 2022-02-27 (×4): 10 mg via ORAL
  Filled 2022-02-24 (×4): qty 1

## 2022-02-24 NOTE — ED Notes (Signed)
Pts belongings (2 bags) in cabinet for Porter D ?

## 2022-02-24 NOTE — Progress Notes (Signed)
Per Ysidro Evert Bobbitt,NP, patient meets criteria for inpatient treatment. There are no available or appropriate beds at [facility] today. CSW faxed referrals to the following facilities for review: ? ?[facility]  Pending - Request Sent N/A [address] [place] [address] [phone] --  ?[facility]  Pending - Request Sent N/A [address], [place] [address] [phone] --  ?[facility]  Pending - Request Sent N/A [address] [place] [address] [phone] --  ?[facility]  Pending - Request Sent N/A [facility] [address] [place] [address] [phone] --  ?[facility]  Pending - Request Sent N/A [address] [place] [address] [phone] --  ?[facility]  Pending - Request Sent N/A [address] [place] [address] [phone] --  ?[facility]  Pending - Request Sent N/A [address] [place], [place] [address] [phone] --  ?[facility]  Pending - Request Sent N/A [address] [place] [address] [phone] --  ?[facility]  Pending - Request Sent N/A [address] [place] [address] [phone] --  ?[facility]  Pending - Request Sent N/A [address] [place] [address] [phone] --  ?[facility]  Pending - Request Sent N/A [address] [place] [address] [phone] --  ?[facility]  Pending - Request Sent N/A [name] [place] [address] [phone] --  ?[facility]  Pending - Request Sent N/A [address], [place] [address] [phone] --  ?[facility]  Pending - Request Sent  N/A [address] [place] [address] [phone] --  ? ?TTS will continue to seek bed placement. ? ?Crissie Reese, MSW, LCSW-A, LCAS-A ?Phone: [phone] ?Disposition/TOC ? ?

## 2022-02-24 NOTE — BH Assessment (Signed)
Comprehensive Clinical Assessment (CCA) Note ? ?02/24/2022 ?Jeffrey Holland ?[id number] ? ?DISPOSITION: Gave clinical report to Roselyn Bering, NP who determined Pt meets criteria for inpatient psychiatric treatment. Notified Sharilyn Sites, PA-C and Augustin Coupe, RN of recommendation via secure message. ? ?The patient demonstrates the following risk factors for suicide: Chronic risk factors for suicide include: psychiatric disorder of schizophrenia and substance use disorder. Acute risk factors for suicide include: unemployment. Protective factors for this patient include: positive social support, positive therapeutic relationship, and responsibility to others (children, family). Considering these factors, the overall suicide risk at this point appears to be low. Patient is not appropriate for outpatient follow up due to psychotic symptoms. ? ?Flowsheet Row ED from 02/24/2022 in [facility] Office Visit from 12/18/2021 in [facility] Admission (Discharged[facility]  ?C-SSRS RISK CATEGORY No Risk No Risk No Risk  ? ?  ? ?Pt is a 36 year old single male who presents unaccompanied to [facility] via Patent examiner after being petitioned for involuntary commitment by his father, Jeffrey Holland [phone]. Affidavit and petition states: " The respondent is not taking his medication as prescribed. The Respondent is running outside in the rain sitting in puddles of water. The respondent is hearing voices and talking to people that have died in the family. The respondent states that his getting ready to go away and no one is going to see him anymore. The respondent is of serious risk of harming himself and there is a reasonable probability that his conduct will be repeated." ? ?Pt has a diagnosis of schizophrenia and a history of substance use. He says he was brought to [facility] "because my mind flipped"  and "I couldn't think straight." He also says his vision became blurry. He describes his mood as "moody." He reports increased energy, decreased concentration, racing thought and anxiety. He denies problems with sleep or appetite. He says he is hearing his deceased brother. He denies current visual hallucinations other than describing his vision as blurry. He denies current suicidal ideation. He denies previous suicide attempt. He reports that he did have history of self-harming behaviors via cutting in his early 12's but has not done this recently. He denies current homicidal ideation or history of violence. Pt reports using a THC vape pen 2-3 times per week. He denies use of other substances and urine drug screen is positive for cannabis.  ? ?Pt cannot identify any stressors. He says he lives with his mother and father and describes their relationship as good. He also identifies two friends as supportive. He denies current legal problems. He denies access to firearms. Pt says he sees a provider for medication management but cannot remember her name. He says he does not see a therapist. He was inpatient at [facility] 01/04-01/23/2023 and diagnosed with schizophrenia.  ? ?TTS spoke with Pt's mother, Jeffrey Holland [phone]. She says Pt has been off medications approximately one week. She corroborates the information in the [place] and petition. She says his psychotic symptoms have been more acute over the past few days. She states yesterday Pt threatened to "blow his brain out." She says he has not been aggressive. ? ?Pt is dressed in hospital scrubs, alert and oriented x4. Pt speaks in a clear tone, at moderate volume and normal pace. Motor behavior appears normal. Eye contact is good. Pt's mood is anxious and affect is congruent with mood. Thought process is coherent  and relevant. Pt is cooperative and says he does not feel he needs to be psychiatrically hospitalized again. ? ? ?Chief Complaint:  ?Chief  Complaint  ?Patient presents with  ? IVC  ? ?Visit Diagnosis: F20.9 Schizophrenia ? ? ?CCA Screening, Triage and Referral (STR) ? ?Patient Reported Information ?How did you hear about us? Self ? ?What Is the Reason for Your Visit/Call Today? Pt has a diagnosis of schizophrenia and history of substance use. He was petitioned for IVC by his father who reports Pt has not been taking his prescribed psychiatric medication. Father reports Pt has been sitting in the rain, talking to deceased relatives, and stating he is going away and no one will see him anymore. Pt states his "mind has flipped" and acknowledges hearing the voices of deceased relatives. ? ?How Long Has This Been Causing You Problems? <Week ? ?What Do You Feel Would Help You the Most Today? Treatment for Depression or other mood problem; Medication(s) ? ? ?Have You Recently Had Any Thoughts About Hurting Yourself? No ? ?Are You Planning to Commit Suicide/Harm Yourself At This time? No ? ? ?Have you Recently Had Thoughts About Hurting Someone Karolee Ohslse? No ? ?Are You Planning to Harm Someone at This Time? No ? ?Explanation: No data recorded ? ?Have You Used Any Alcohol or Drugs in the Past 24 Hours? Yes ? ?How Long Ago Did You Use Drugs or Alcohol? No data recorded ?What Did You Use and How Much? THC vape ? ? ?Do You Currently Have a Therapist/Psychiatrist? Yes ? ?Name of Therapist/Psychiatrist: Pt cannot remember the name of his provider ? ? ?Have You Been Recently Discharged From Any Office Practice or Programs? Yes ? ?Explanation of Discharge From Practice/Program: [facility] ? ? ?  ?CCA Screening Triage Referral Assessment ?Type of Contact: Tele-Assessment ? ?Telemedicine Service Delivery: Telemedicine service delivery: This service was provided via telemedicine using a 2-way, interactive audio and video technology ? ?Is this Initial or Reassessment? Initial Assessment ? ?Date Telepsych consult ordered in [facility]:  02/24/22 ? ?Time  Telepsych consult ordered in [facility]:  0215 ? ?Location of Assessment: [facility] ? ?[facility] ? ? ?Collateral Involvement: Pt's mother and father ? ? ?Does Patient Have a Automotive engineerCourt Appointed Legal Guardian? No data recorded ?Name and Contact of Legal Guardian: No data recorded ?If Minor and Not Living with Parent(s), Who has Custody? NA ? ?Is CPS involved or ever been involved? Never ? ?Is APS involved or ever been involved? Never ? ? ?Patient Determined To Be At Risk for Harm To Self or Others Based on Review of Patient Reported Information or Presenting Complaint? Yes, for Self-Harm ? ?Method: No data recorded ?Availability of Means: No data recorded ?Intent: No data recorded ?Notification Required: No data recorded ?Additional Information for Danger to Others Potential: No data recorded ?Additional Comments for Danger to Others Potential: No data recorded ?Are There Guns or Other Weapons in Your Home? No data recorded ?Types of Guns/Weapons: No data recorded ?Are These Weapons Safely Secured?                            No data recorded ?Who Could Verify You Are Able To Have These Secured: No data recorded ?Do You Have any Outstanding Charges, Pending Court Dates, Parole/Probation? No data recorded ?Contacted To Inform of Risk of Harm To Self or Others: Family/Significant Other: ? ? ? ?Does Patient Present under Involuntary  Commitment? Yes ? ?IVC Papers Initial File Date: 02/23/22 ? ? ?[place] of Residence: [place] ? ? ?Patient Currently Receiving the Following Services: Medication Management ? ? ?Determination of Need: Emergent (2 hours) ? ? ?Options For Referral: Inpatient Hospitalization ? ? ? ? ?CCA Biopsychosocial ?Patient Reported Schizophrenia/Schizoaffective Diagnosis in Past: Yes ? ? ?Strengths: He has stable housing. Pt is able to identify his thoughts, feelings, and concerns. He answers the questions posed in an open manner. ? ? ?Mental Health Symptoms ?Depression:   ?Change in  energy/activity; Difficulty Concentrating ?  ?Duration of Depressive symptoms:  ?Duration of Depressive Symptoms: Less than two weeks ?  ?Mania:   ?Change in energy/activity; Increased Energy; Racing thoughts ?  ?A

## 2022-02-24 NOTE — ED Provider Notes (Signed)
?[facility]-EMERGENCY DEPT ?Provider Note ? ? ?CSN: [id number] ?Arrival date & time: 02/24/22  0016 ? ?  ? ?History ? ?Chief Complaint  ?Patient presents with  ? IVC  ? ? ?Jeffrey Holland is a 36 y.o. male. ? ?The history is provided by the patient and medical records.  ? ?36 year old male with history of alcohol abuse, cocaine abuse, hepatitis C, schizophrenia, presenting to the ED under IVC petition by his father. ? ?Per IVC paperwork, patient has not been taking his medications, has been talking to family members that are dead, and made threats about "disappearing".  Reportedly he has a history of self-harm in the past and is concerned for repeated behaviors.  Patient without any physical complaints at this time.  He reports he has been taking his medications as prescribed. ? ?Home Medications ?Prior to Admission medications   ?Medication Sig Start Date End Date Taking? Authorizing Provider  ?benztropine (COGENTIN) 1 MG tablet Take 1 tablet (1 mg total) by mouth 2 (two) times daily. 01/15/22   Penn, Cranston Neighbor, NP  ?haloperidol (HALDOL) 10 MG tablet Take 1 tablet (10 mg total) by mouth at bedtime. 01/15/22   Penn, Cranston Neighbor, NP  ?haloperidol (HALDOL) 5 MG tablet Take 1 tablet (5 mg total) by mouth 2 (two) times daily. 01/15/22   Penn, Cranston Neighbor, NP  ?hydrOXYzine (ATARAX) 50 MG tablet Take 1 tablet (50 mg total) by mouth 3 (three) times daily as needed for anxiety. 01/15/22   Mcneil Sober, NP  ?Multiple Vitamin (MULTIVITAMIN WITH MINERALS) TABS tablet Take 1 tablet by mouth daily. 12/18/21   Penn, Cranston Neighbor, NP  ?traZODone (DESYREL) 100 MG tablet Take 1 tablet (100 mg total) by mouth at bedtime as needed for sleep. 01/15/22   Mcneil Sober, NP  ?   ? ?Allergies    ?Patient has no known allergies.   ? ?Review of Systems   ?Review of Systems  ?Psychiatric/Behavioral:    ?     IVC  ?All other systems reviewed and are negative. ? ?Physical Exam ?Updated Vital Signs ?BP 108/77   Pulse 96   Temp 97.7 ?F (36.5 ?C)   Resp  16   Ht 5\' 6"  (1.676 m)   Wt 69.4 kg   SpO2 98%   BMI 24.69 kg/m?  ? ?Physical Exam ?Vitals and nursing note reviewed.  ?Constitutional:   ?   Appearance: He is well-developed.  ?HENT:  ?   Head: Normocephalic and atraumatic.  ?Eyes:  ?   Conjunctiva/sclera: Conjunctivae normal.  ?   Pupils: Pupils are equal, round, and reactive to light.  ?Cardiovascular:  ?   Rate and Rhythm: Normal rate and regular rhythm.  ?   Heart sounds: Normal heart sounds.  ?Pulmonary:  ?   Effort: Pulmonary effort is normal.  ?   Breath sounds: Normal breath sounds.  ?Abdominal:  ?   General: Bowel sounds are normal.  ?   Palpations: Abdomen is soft.  ?Musculoskeletal:     ?   General: Normal range of motion.  ?   Cervical back: Normal range of motion.  ?Skin: ?   General: Skin is warm and dry.  ?Neurological:  ?   Mental Status: He is alert and oriented to person, place, and time.  ?Psychiatric:  ?   Comments: Calm, cooperative ?Does not appear to be responding to internal stimuli at this time  ? ? ?ED Results / Procedures / Treatments   ?Labs ?(all labs ordered are listed, but only abnormal  results are displayed) ?Labs Reviewed  ?CBC WITH DIFFERENTIAL/PLATELET - Abnormal; Notable for the following components:  ?    Result Value  ? WBC 19.5 (*)   ? MCH 34.3 (*)   ? Neutro Abs 15.4 (*)   ? Monocytes Absolute 1.4 (*)   ? Abs Immature Granulocytes 0.09 (*)   ? All other components within normal limits  ?COMPREHENSIVE METABOLIC PANEL - Abnormal; Notable for the following components:  ? CO2 20 (*)   ? BUN 21 (*)   ? ALT 46 (*)   ? All other components within normal limits  ?RAPID URINE DRUG SCREEN, HOSP PERFORMED - Abnormal; Notable for the following components:  ? Tetrahydrocannabinol POSITIVE (*)   ? All other components within normal limits  ?SALICYLATE LEVEL - Abnormal; Notable for the following components:  ? Salicylate Lvl <7.0 (*)   ? All other components within normal limits  ?ACETAMINOPHEN LEVEL - Abnormal; Notable for the  following components:  ? Acetaminophen (Tylenol), Serum <10 (*)   ? All other components within normal limits  ?RESP PANEL BY RT-PCR (FLU A&B, COVID) [id number]  ?ETHANOL  ? ? ?EKG ?None ? ?Radiology ?No results found. ? ?Procedures ?Procedures  ? ? ?Medications Ordered in ED ?Medications - No data to display ? ?ED Course/ Medical Decision Making/ A&P ?  ?                        ?Medical Decision Making ?Amount and/or Complexity of Data Reviewed ?Labs: ordered. ?ECG/medicine tests: ordered and independent interpretation performed. ? ?Risk ?OTC drugs. ? ? ?36 y.o. M here under IVC petitioned by father.  Apparently he has not been taking meds, has been talking to deceased family members, and has made threats about "disappearing".  He has history of self harm and family concerned for same again. ? ?He is calm and cooperative here.  No physical complaints.  He did request nicotine patch which was given.  Labs reviewed--does have leukocytosis but no fever or other infectious symptoms.  His UDS is positive for THC.  COVID/flu screen is negative.  Medically cleared.  Will get TTS evaluation. ? ?TTS has evaluated and recommended IP treatment.  No beds available at [facility] currently so will seek outside placement. ? ?Final Clinical Impression(s) / ED Diagnoses ?Final diagnoses:  ?Involuntary commitment  ? ? ?Rx / DC Orders ?ED Discharge Orders   ? ? None  ? ?  ? ? ?  ?Garlon Hatchet, PA-C ?02/24/22 0618 ? ?  ?Tilden Fossa, MD ?02/25/22 0141 ? ?

## 2022-02-24 NOTE — ED Triage Notes (Signed)
Pt presents to ED following IVC order, pt has been off medications and hearing and talking to things that are not there.  ?

## 2022-02-24 NOTE — Progress Notes (Signed)
CSW spoke with Marya Amsler with [facility], who requested IVC paperwork be faxed to [phone]; for further review. CSW notified  current treatment team via secure chat.  ? ?Glennie Isle, MSW, LCSW-A, LCAS-A ?Phone: [phone] ?Disposition/TOC ? ?

## 2022-02-24 NOTE — ED Provider Notes (Signed)
Emergency Medicine Observation Re-evaluation Note ? ?Jeffrey Holland is a 36 y.o. male, seen on rounds today at 0700.  Pt initially presented to the ED for complaints of IVC ?Currently, the patient is resting comfortably. ? ?Physical Exam  ?BP 108/77   Pulse 96   Temp 97.7 ?F (36.5 ?C)   Resp 16   Ht 5\' 6"  (1.676 m)   Wt 69.4 kg   SpO2 98%   BMI 24.69 kg/m?  ?Physical Exam ?General: NAD ? ? ?ED Course / MDM  ?EKG:  ? ?I have reviewed the labs performed to date as well as medications administered while in observation.  Recent changes in the last 24 hours include no acute events reported. ? ?Plan  ?Current plan is for pending psych placement. ?PARRISH BONN is under involuntary commitment. ?  ? ?  ?Antony Haste, MD ?02/24/22 0815 ? ?

## 2022-02-25 MED ORDER — LORAZEPAM 1 MG PO TABS
1.0000 mg | ORAL_TABLET | Freq: Once | ORAL | Status: AC
Start: 2022-02-25 — End: 2022-02-25
  Administered 2022-02-25: 1 mg via ORAL
  Filled 2022-02-25: qty 1

## 2022-02-25 NOTE — ED Provider Notes (Signed)
Emergency Medicine Observation Re-evaluation Note ? ?Jeffrey Holland is a 36 y.o. male, seen on rounds today.  Pt initially presented to the ED for complaints of IVC ?Currently, the patient is awake and alert.  He feels very anxious and said he did not sleep last night. ? ?Physical Exam  ?BP 112/71 (BP Location: Right Arm)   Pulse 74   Temp 97.7 ?F (36.5 ?C) (Oral)   Resp 18   Ht 5\' 6"  (1.676 m)   Wt 69.4 kg   SpO2 93%   BMI 24.69 kg/m?  ?Physical Exam ?General: awake and alert ?Cardiac: rrr ?Lungs: cta b ?Psych: anxious ? ?ED Course / MDM  ?EKG:  ? ?I have reviewed the labs performed to date as well as medications administered while in observation.  Recent changes in the last 24 hours include none.  We continue to await placement. ? ?Plan  ?Current plan is for inpatient admisison. ?Jeffrey Holland is under involuntary commitment. ?  ? ?  ?Antony Haste, MD ?02/25/22 0825 ? ?

## 2022-02-25 NOTE — Progress Notes (Addendum)
Per Hessie Diener Bobbitt,NP, patient meets criteria for inpatient treatment. There are no available or appropriate beds at [facility] today. CSW re-faxed referrals to the following facilities for review: ? ?[address] [place] [address] [phone] --  ?[place] N/A [address], [place] [address] [phone] --  ?[facility]  Pending - Request Sent N/A [address] [place] [address] [phone] --  ?[facility] [address] [place] [address] [phone] --  ?[address] [place] [address] [phone] --  ?[facility]  Pending - Request Sent N/A [address] [place] [address] [phone] --  ?[place] N/A [address] [place], [place] [address] [phone] --  ?[place] [address] [place] [address] [phone] --  ?[place] [address] [place] [address] [phone] --  ?[place] N/A [address], [place] [address] [phone] --  ?[place] N/A [place], [place] [address] [phone] --  ?[place] Sent N/A [address] [place] [address] [phone] --  ?[facility]  Pending - Request Sent N/A [address] [place] [address] [phone] --  ?[facility]  Pending - Request  Sent N/A [place], [place] [address] [phone] --  ?[facility]  Pending - [place] Sent N/A [address] [place] [address] [phone] --  ?[facility]  Pending - [place] N/A [address] [place] [address] [phone] --  ? ?TTS will continue to seek bed placement. ? ?Glennie Isle, MSW, LCSW-A, LCAS-A ?Phone: [phone] ?Disposition/TOC ? ?

## 2022-02-25 NOTE — Progress Notes (Signed)
Pt given dinner tray. Pt slept most of the afternoon. Alerted pt he had dinner available on his bedside tray when ready. Pt acknowledged and went back to sleep. ?

## 2022-02-25 NOTE — Consult Note (Incomplete)
Telepsych Consultation  ? ?Reason for Consult:  *** ?Referring Physician:  *** ?Location of Patient:  ?Location of Provider: { [facility] Provider Location:[id number]} ? ?Patient Identification: Jeffrey Holland ?MRN:  [id number] ?Principal Diagnosis: <principal problem not specified> ?Diagnosis:  Active Problems: ?  * No active hospital problems. * ? ? ?Total Time spent with patient: {Time; 15 min - 8 hours:17441} ? ?Subjective:   ?Jeffrey Holland is a 36 y.o. male patient admitted with ***. ? ?HPI:  *** ? ?Past Psychiatric History: *** ? ?Risk to Self:   ?Risk to Others:   ?Prior Inpatient Therapy:   ?Prior Outpatient Therapy:   ? ?Past Medical History:  ?Past Medical History:  ?Diagnosis Date  ?? Anxiety   ?? Asthma   ?? Panic   ? History reviewed. No pertinent surgical history. ?Family History: History reviewed. No pertinent family history. ?Family Psychiatric  History: *** ?Social History:  ?Social History  ? ?Substance and Sexual Activity  ?Alcohol Use Yes  ?   ?Social History  ? ?Substance and Sexual Activity  ?Drug Use Yes  ?? Types: Marijuana, Methamphetamines, Cocaine  ?  ?Social History  ? ?Socioeconomic History  ?? Marital status: Single  ?  Spouse name: Not on file  ?? Number of children: Not on file  ?? Years of education: Not on file  ?? Highest education level: Not on file  ?Occupational History  ?? Not on file  ?Tobacco Use  ?? Smoking status: Every Day  ?  Packs/day: 1.00  ?  Types: Cigarettes  ?? Smokeless tobacco: Not on file  ?Vaping Use  ?? Vaping Use: Every day  ?Substance and Sexual Activity  ?? Alcohol use: Yes  ?? Drug use: Yes  ?  Types: Marijuana, Methamphetamines, Cocaine  ?? Sexual activity: Yes  ?  Birth control/protection: None  ?Other Topics Concern  ?? Not on file  ?Social History Narrative  ? ** Merged History Encounter **  ?    ? ?Social Determinants of Health  ? ?Financial Resource Strain: Not on file  ?Food Insecurity: Not on file  ?Transportation Needs: Not on file  ?Physical  Activity: Not on file  ?Stress: Not on file  ?Social Connections: Not on file  ? ?Additional Social History: ?  ? ?Allergies:  No Known Allergies ? ?Labs:  ?Results for orders placed or performed during the hospital encounter of 02/24/22 (from the past 48 hour(s))  ?CBC with Differential     Status: Abnormal  ? Collection Time: 02/24/22 12:43 AM  ?Result Value Ref Range  ? WBC 19.5 (H) 4.0 - 10.5 K/uL  ? RBC 4.52 4.22 - 5.81 MIL/uL  ? Hemoglobin 15.5 13.0 - 17.0 g/dL  ? HCT 44.0 39.0 - 52.0 %  ? MCV 97.3 80.0 - 100.0 fL  ? MCH 34.3 (H) 26.0 - 34.0 pg  ? MCHC 35.2 30.0 - 36.0 g/dL  ? RDW 12.5 11.5 - 15.5 %  ? Platelets 333 150 - 400 K/uL  ? nRBC 0.0 0.0 - 0.2 %  ? Neutrophils Relative % 79 %  ? Neutro Abs 15.4 (H) 1.7 - 7.7 K/uL  ? Lymphocytes Relative 13 %  ? Lymphs Abs 2.6 0.7 - 4.0 K/uL  ? Monocytes Relative 7 %  ? Monocytes Absolute 1.4 (H) 0.1 - 1.0 K/uL  ? Eosinophils Relative 0 %  ? Eosinophils Absolute 0.1 0.0 - 0.5 K/uL  ? Basophils Relative 0 %  ? Basophils Absolute 0.1 0.0 - 0.1 K/uL  ? Immature Granulocytes  1 %  ? Abs Immature Granulocytes 0.09 (H) 0.00 - 0.07 K/uL  ?  Comment: Performed at [facility], [address] [place], [place] [address]  ?Comprehensive metabolic panel     Status: Abnormal  ? Collection Time: 02/24/22 12:43 AM  ?Result Value Ref Range  ? Sodium 136 135 - 145 mmol/L  ? Potassium 3.6 3.5 - 5.1 mmol/L  ? Chloride 106 98 - 111 mmol/L  ? CO2 20 (L) 22 - 32 mmol/L  ? Glucose, Bld 92 70 - 99 mg/dL  ?  Comment: Glucose reference range applies only to samples taken after fasting for at least 8 hours.  ? BUN 21 (H) 6 - 20 mg/dL  ? Creatinine, Ser 1.04 0.61 - 1.24 mg/dL  ? Calcium 9.2 8.9 - 10.3 mg/dL  ? Total Protein 7.8 6.5 - 8.1 g/dL  ? Albumin 4.2 3.5 - 5.0 g/dL  ? AST 33 15 - 41 U/L  ? ALT 46 (H) 0 - 44 U/L  ? Alkaline Phosphatase 50 38 - 126 U/L  ? Total Bilirubin 0.8 0.3 - 1.2 mg/dL  ? GFR, Estimated >60 >60 mL/min  ?  Comment: (NOTE) ?Calculated using the CKD-EPI  Creatinine Equation (2021) ?  ? Anion gap 10 5 - 15  ?  Comment: Performed at [facility], [address] [place], [place] [address]  ?Ethanol     Status: None  ? Collection Time: 02/24/22 12:43 AM  ?Result Value Ref Range  ? Alcohol, Ethyl (B) <10 <10 mg/dL  ?  Comment: (NOTE) ?Lowest detectable limit for serum alcohol is 10 mg/dL. ? ?For medical purposes only. ?Performed at [facility], [address] ?[place], [place] [address] ?  ?Salicylate level     Status: Abnormal  ? Collection Time: 02/24/22 12:43 AM  ?Result Value Ref Range  ? Salicylate Lvl <7.0 (L) 7.0 - 30.0 mg/dL  ?  Comment: Performed at [facility], [address] [place], [place] [address]  ?Acetaminophen level     Status: Abnormal  ? Collection Time: 02/24/22 12:43 AM  ?Result Value Ref Range  ? Acetaminophen (Tylenol), Serum <10 (L) 10 - 30 ug/mL  ?  Comment: (NOTE) ?Therapeutic concentrations vary significantly. A range of 10-30 ug/mL  ?may be an effective concentration for many patients. However, some  ?are best treated at concentrations outside of this range. ?Acetaminophen concentrations >150 ug/mL at 4 hours after ingestion  ?and >50 ug/mL at 12 hours after ingestion are often associated with  ?toxic reactions. ? ?Performed at [facility], [address] ?[place], [place] [address] ?  ?Resp Panel by RT-PCR (Flu A&B, Covid) Nasopharyngeal Swab     Status: None  ? Collection Time: 02/24/22 12:58 AM  ? Specimen: Nasopharyngeal Swab; Nasopharyngeal(NP) swabs in vial transport medium  ?Result Value Ref Range  ? SARS Coronavirus 2 by RT PCR NEGATIVE NEGATIVE  ?  Comment: (NOTE) ?SARS-CoV-2 target nucleic acids are NOT DETECTED. ? ?The SARS-CoV-2 RNA is generally detectable in upper respiratory ?specimens during the acute phase of infection. The lowest ?concentration of SARS-CoV-2 viral copies this assay can detect is ?138 copies/mL. A negative result does not  preclude SARS-Cov-2 ?infection and should not be used as the sole basis for treatment or ?other patient management decisions. A negative result may occur with  ?improper specimen collection/handling, submission of specimen other ?than nasopharyngeal swab, presence of viral mutation(s) within the ?areas targeted by this assay, and inadequate  number of viral ?copies(<138 copies/mL). A negative result must be combined with ?clinical observations, patient history, and epidemiological ?information. The expected result is Negative. ? ?Fact Sheet for Patients:  ?BloggerCourse.com ? ?Fact Sheet for Healthcare Providers:  ?SeriousBroker.it ? ?This test is no t yet approved or cleared by the [place] FDA and  ?has been authorized for detection and/or diagnosis of SARS-CoV-2 by ?FDA under an Emergency Use Authorization (EUA). This EUA will remain  ?in effect (meaning this test can be used) for the duration of the ?COVID-19 declaration under Section 564(b)(1) of the Act, 21 ?U.S.C.section 360bbb-3(b)(1), unless the authorization is terminated  ?or revoked sooner.  ? ? ?  ? Influenza A by PCR NEGATIVE NEGATIVE  ? Influenza B by PCR NEGATIVE NEGATIVE  ?  Comment: (NOTE) ?The Xpert Xpress SARS-CoV-2/FLU/RSV plus assay is intended as an aid ?in the diagnosis of influenza from Nasopharyngeal swab specimens and ?should not be used as a sole basis for treatment. Nasal washings and ?aspirates are unacceptable for Xpert Xpress SARS-CoV-2/FLU/RSV ?testing. ? ?Fact Sheet for Patients: ?BloggerCourse.com ? ?Fact Sheet for Healthcare Providers: ?SeriousBroker.it ? ?This test is not yet approved or cleared by the [place] FDA and ?has been authorized for detection and/or diagnosis of SARS-CoV-2 by ?FDA under an Emergency Use Authorization (EUA). This EUA will remain ?in effect (meaning this test can be used) for the duration of  the ?COVID-19 declaration under Section 564(b)(1) of the Act, 21 U.S.C. ?section 360bbb-3(b)(1), unless the authorization is terminated or ?revoked. ? ?Performed at [facility], [address]

## 2022-02-25 NOTE — Progress Notes (Signed)
CSW followed-up with [facility] in reference to referral sent for placement. CSW spoke with [place] who advised that the provider declined accepted due to no appropriate beds at this time.  ? ?Jeffrey Holland, MSW, LCSW-A, LCAS-A ?Phone: [phone] ?Disposition/TOC ? ?

## 2022-02-25 NOTE — ED Notes (Signed)
Spoke with mother and gave her an update on patient at this time.  ?

## 2022-02-26 MED ORDER — ONDANSETRON 4 MG PO TBDP
4.0000 mg | ORAL_TABLET | Freq: Three times a day (TID) | ORAL | Status: DC | PRN
  Administered 2022-02-26: 4 mg via ORAL
  Filled 2022-02-26: qty 1

## 2022-02-26 MED ORDER — LORAZEPAM 2 MG/ML IJ SOLN
2.0000 mg | Freq: Once | INTRAMUSCULAR | Status: AC
Start: 2022-02-26 — End: 2022-02-26
  Administered 2022-02-26: 2 mg via INTRAMUSCULAR
  Filled 2022-02-26: qty 1

## 2022-02-26 NOTE — Consult Note (Signed)
Provider met with patient who covered his body from head to toe and was tearful.  He reported seeing his dead father lying next to him.  He also reported hearing voices telling him that his father is dead and standing next to him.  He admitted he has not taken his Psychotropic medications in a month because he is out of them. ?Patient, at this time meets criteria for inpatient hospitalization and his records are faxed out to hospitals for bed placement. ? ?

## 2022-02-26 NOTE — ED Notes (Signed)
Pt signed a consent to release information form. ? ?Pt agrees to have his information released to his mother  ?(Rickey Barbara) ? ?Copy of form placed in patients orange IVC folder and also placed in by secretary desk to be scanned into his chart.  ?

## 2022-02-26 NOTE — ED Notes (Signed)
Pt continues to exhibit paranoid behaviors.  Also, pt requesting to go home and reeducated on the IVC process.  ?

## 2022-02-26 NOTE — Progress Notes (Signed)
Patient has been denied by [facility] per the provider putting a hold on admissions today. Patient meets [facility] inpatient criteria per [name], NP. Patient has been faxed out to multiple facilities.  ? ?Damita Dunnings, MSW, LCSW-A  ?11:40 AM 02/26/2022   ?

## 2022-02-26 NOTE — ED Notes (Signed)
Pt speaking w/ father on phone.  Calm and appropriate conversation noted.  However, Pt was overheard stating he "wants to go home."    ?

## 2022-02-26 NOTE — ED Notes (Signed)
Pt speaking with mother on the phone. No issues. Pt is cooperative at this time.  ?

## 2022-02-26 NOTE — ED Provider Notes (Signed)
Emergency Medicine Observation Re-evaluation Note ? ?Jeffrey Holland is a 36 y.o. male, seen on rounds today.  Pt initially presented to the ED for complaints of IVC ?Currently, the patient is sleeping. ? ?Physical Exam  ?BP 119/80 (BP Location: Left Arm)   Pulse 96   Temp 97.7 ?F (36.5 ?C) (Oral)   Resp 20   Ht 5\' 6"  (1.676 m)   Wt 69.4 kg   SpO2 100%   BMI 24.69 kg/m?  ?Physical Exam ?General: asleep ?Cardiac: not assessed, asleep ?Lungs: normal effort ?Psych: not assessed, asleep ? ?ED Course / MDM  ?EKG:  ? ?I have reviewed the labs performed to date as well as medications administered while in observation.  Recent changes in the last 24 hours include ativan being ordered. ? ?Plan  ?Current plan is for inpatient psychiatric placement. ?Jeffrey Holland is under involuntary commitment. ?  ? ?  ?Antony Haste, MD ?02/26/22 1115 ? ?

## 2022-02-27 MED ORDER — NICOTINE 21 MG/24HR TD PT24
21.0000 mg | MEDICATED_PATCH | Freq: Every day | TRANSDERMAL | Status: DC
  Administered 2022-02-27: 21 mg via TRANSDERMAL
  Filled 2022-02-27: qty 1

## 2022-02-27 MED ORDER — LORAZEPAM 1 MG PO TABS
2.0000 mg | ORAL_TABLET | Freq: Once | ORAL | Status: AC
  Administered 2022-02-27: 2 mg via ORAL
  Filled 2022-02-27: qty 2

## 2022-02-27 NOTE — Progress Notes (Signed)
CSW contacted Jeffrey Holland regarding bed availability. Per Jeffrey Holland they currently have openings for male patients. The Intake RN [name], has agreed to review the Pt for a possible admissions.  ? ? ?[name], MSW, LCSW-A  ?12:07 PM 02/27/2022   ?

## 2022-02-27 NOTE — Progress Notes (Signed)
[facility] LCSW Progress Note ?  ?02/27/2022    12:21 PM ? ?Jeffrey Holland  ? ?[id number]  ? ?Type of Contact and Topic:  Psychiatric Bed Placement  ? ?Pt accepted to [facility]     ? ?Patient meets inpatient criteria per [name], NP ? ?The attending provider will be Joylene Igo, NP ? ?Call report to [phone] ? ?Alveria Apley, RN @ [facility] ED notified.    ? ?Pt scheduled  to arrive at [facility] TODAY AFTER 1700.  ? ?Damita Dunnings, MSW, LCSW-A  ?12:23 PM 02/27/2022   ?  ? ?  ?  ? ? ? ? ?  ?

## 2022-02-27 NOTE — ED Notes (Signed)
Patients mother would like to speak with the social worker on his case for an update. ?

## 2022-02-27 NOTE — Progress Notes (Signed)
CSW followed up with [facility] regarding difficulties submitting IVC paperwork via fax. [facility] provided multiple fax numbers but was unable to send the IVC paperwork through. CSW attempted to speak with the intake RN but there was no answer. CSW provided the charge RN on the [facility] at [facility], the contact number for the assigned ED RN to follow-up regarding sending the IVC paperwork. 2nd shift CSW will follow up.  ? ?Damita Dunnings, MSW, LCSW-A  ?3:49 PM 02/27/2022   ?

## 2022-02-27 NOTE — ED Notes (Signed)
Report given to RN at [facility].  ?

## 2022-02-27 NOTE — ED Provider Notes (Signed)
Emergency Medicine Observation Re-evaluation Note ? ?Jeffrey Holland is a 36 y.o. male, seen on rounds today.  Pt initially presented to the ED for complaints of IVC ?Currently, the patient is resting. ? ?Physical Exam  ?BP (!) 123/96 (BP Location: Left Arm)   Pulse 90   Temp 97.7 ?F (36.5 ?C) (Oral)   Resp 16   Ht 5\' 6"  (1.676 m)   Wt 69.4 kg   SpO2 96%   BMI 24.69 kg/m?  ?Physical Exam ?General: resting comfortably, calm ?Cardiac: rrr ?Lungs: equal chest rise b/l ?Psych: calm ? ?ED Course / MDM  ?EKG:EKG Interpretation ? ?Date/Time:  Saturday February 24 2022 00:39:20 EDT ?Ventricular Rate:  121 ?PR Interval:  136 ?QRS Duration: 80 ?QT Interval:  310 ?QTC Calculation: 440 ?R Axis:   79 ?Text Interpretation: Sinus tachycardia Probable left atrial enlargement Confirmed by 03-17-1972 [id number]) on 02/26/2022 3:46:06 PM ? ?I have reviewed the labs performed to date as well as medications administered while in observation.  Recent changes in the last 24 hours include none. ? ?Plan  ?Current plan is for placement. ?Jeffrey Holland is under involuntary commitment. ?  ? ?  ?Antony Haste, DO ?02/27/22 0809 ? ?

## 2022-02-27 NOTE — ED Notes (Signed)
Pt on the phone with his mother. Pt having periods of agitation.  ?

## 2022-02-27 NOTE — ED Notes (Signed)
Pt mom updated on plan of care. Mother aware of placement found for pt and time pt would be transferred.  ?

## 2022-02-27 NOTE — ED Notes (Addendum)
Pt at nurses station asking why he is still in the hospital. Pt reports he does not need to be in the hospital and that there is nothing wrong with him. This RN explained to pt that he is under IVC. Pt states he wanted to speak with psychiatrist.  ?

## 2022-02-27 NOTE — ED Notes (Signed)
Pt continues to sleep at present time. Continue to observe. ?

## 2022-02-27 NOTE — ED Notes (Addendum)
Attempted to call report at [facility] for patient's upcoming transport at 1700. This RN was instructed to call closer to 1700.  ?

## 2022-02-27 NOTE — ED Notes (Signed)
Mother came to visit pt.  ?

## 2022-02-27 NOTE — ED Notes (Signed)
Sheriff dept called for transport needs.  ?

## 2022-02-27 NOTE — Consult Note (Signed)
Patient continues to see and hear unseen people in the unit.  He just received Ativan for anxiety after complaint of AH and exhibition of agitation after speaking to his mother.  Patient already received scheduled Olanzapine.  He has been accepted by [facility] and will be leaving this evening. ? ?

## 2022-02-27 NOTE — ED Notes (Signed)
Pt resting peacefully at this time. RR even and unlabored.  ?

## 2022-02-27 NOTE — Progress Notes (Signed)
Patient has been denied by [facility] due to no appropriate beds available. Patient meets BH inpatient criteria per [name], NP. Patient has been faxed out to the following facilities:  ? ? ?[facility]  [address] [place] [address] [phone]  ?[facility]  [address], [place] [address] [phone]  ?[facility]  [address] [place] [address] [phone]  ?[facility] [address] [place] [address] [phone]  ?[facility]  [address] [place] [address] [phone]  ?[facility]  [address] [place], [place] [address] [phone]  ?[facility]  [address] [place], [place] [address] [phone]  ?[facility]  [address], [place] [address] [phone]  ?[facility]  [address], [place] [address] [phone]  ?[facility]  [address] [place] [address] [phone]  ?[facility]  [address] [place] [address] [phone]  ?[facility]  [address] [place] [place] [address] [phone]  ?[facility]  [address] [place] [address] [phone]  ?[facility]  [address] [place], [place] [address] [phone]  ?[facility]  [address] [place] [address] [phone]  ?[facility]  [address] [place] [address] [phone]  ? ?Damita Dunnings, MSW, LCSW-A  ?11:53 AM 02/27/2022   ?

## 2022-02-27 NOTE — ED Notes (Signed)
Sheriff at bedside to transport pt.

## 2022-05-28 ENCOUNTER — Ambulatory Visit: Payer: Self-pay | Attending: Nurse Practitioner | Admitting: Nurse Practitioner

## 2022-05-28 ENCOUNTER — Encounter: Payer: Self-pay | Admitting: Nurse Practitioner

## 2022-05-28 ENCOUNTER — Other Ambulatory Visit: Payer: Self-pay

## 2022-05-28 VITALS — BP 94/61 | HR 71 | Temp 98.1°F | Wt 156.0 lb

## 2022-05-28 DIAGNOSIS — B1920 Unspecified viral hepatitis C without hepatic coma: Secondary | ICD-10-CM

## 2022-05-28 DIAGNOSIS — Z7689 Persons encountering health services in other specified circumstances: Secondary | ICD-10-CM

## 2022-05-28 DIAGNOSIS — F29 Unspecified psychosis not due to a substance or known physiological condition: Secondary | ICD-10-CM

## 2022-05-28 DIAGNOSIS — Z09 Encounter for follow-up examination after completed treatment for conditions other than malignant neoplasm: Secondary | ICD-10-CM

## 2022-05-28 MED ORDER — TRAZODONE HCL 100 MG PO TABS
100.0000 mg | ORAL_TABLET | Freq: Every evening | ORAL | 2 refills | Status: AC | PRN
  Filled 2022-05-28: qty 30, 30d supply, fill #0

## 2022-05-28 MED ORDER — HALOPERIDOL 10 MG PO TABS
10.0000 mg | ORAL_TABLET | Freq: Every day | ORAL | 0 refills | Status: AC
  Filled 2022-05-28: qty 30, 30d supply, fill #0

## 2022-05-28 MED ORDER — HALOPERIDOL 5 MG PO TABS
5.0000 mg | ORAL_TABLET | Freq: Two times a day (BID) | ORAL | 0 refills | Status: AC
  Filled 2022-05-28: qty 60, 30d supply, fill #0

## 2022-05-28 MED ORDER — BENZTROPINE MESYLATE 1 MG PO TABS
1.0000 mg | ORAL_TABLET | Freq: Two times a day (BID) | ORAL | 0 refills | Status: AC
  Filled 2022-05-28: qty 60, 30d supply, fill #0

## 2022-05-28 MED ORDER — HYDROXYZINE HCL 50 MG PO TABS
50.0000 mg | ORAL_TABLET | Freq: Three times a day (TID) | ORAL | 0 refills | Status: AC | PRN
  Filled 2022-05-28: qty 90, 30d supply, fill #0

## 2022-05-28 NOTE — Progress Notes (Unsigned)
Concerns about Hep C diagnosis  January, 2023

## 2022-05-28 NOTE — Progress Notes (Signed)
Assessment & Plan:  Jeffrey Holland was seen today for hospitalization follow-up and hep c virus.  Diagnoses and all orders for this visit:  Encounter to establish care  Hospital discharge follow-up  Hepatitis C test positive -     Ambulatory referral to Infectious Disease -     CMP14+EGFR -     CBC with Differential  Schizophrenia spectrum disorder with psychotic disorder type not yet determined (HCC) -     haloperidol (HALDOL) 10 MG tablet; Take 1 tablet (10 mg total) by mouth at bedtime. -     haloperidol (HALDOL) 5 MG tablet; Take 1 tablet (5 mg total) by mouth 2 (two) times daily. -     traZODone (DESYREL) 100 MG tablet; Take 1 tablet (100 mg total) by mouth at bedtime as needed for sleep. -     benztropine (COGENTIN) 1 MG tablet; Take 1 tablet (1 mg total) by mouth 2 (two) times daily. -     hydrOXYzine (ATARAX) 50 MG tablet; Take 1 tablet (50 mg total) by mouth 3 (three) times daily as needed for anxiety.    Patient has been counseled on age-appropriate routine health concerns for screening and prevention. These are reviewed and up-to-date. Referrals have been placed accordingly. Immunizations are up-to-date or declined.    Subjective:   Chief Complaint  Patient presents with   Hospitalization Follow-up   hep c virus   HPI Jeffrey Holland 36 y.o. male presents to office today for hospital follow up and to establish care.   He has a past medical history of Anxiety, Asthma, Insomnia, and Schizophrenia.   He has a history of medication non adherence and lack of follow up. Endorses Visual and Auditory hallucinations at times. Was recently being managed by [facility] health center but ended up being lost to follow up as well with office visits. He is aware that he will need to return for routine visits with [facility] soon. He was admitted under IVC 02-24-2022.   Will refill current medications (cogentin, haldol, hydroxyzine and trazodone)  Has positive HEP C RNA. Referred to ID  for this.   Review of Systems  Constitutional:  Negative for fever, malaise/fatigue and weight loss.  HENT: Negative.  Negative for nosebleeds.   Eyes: Negative.  Negative for blurred vision, double vision and photophobia.  Respiratory: Negative.  Negative for cough and shortness of breath.   Cardiovascular: Negative.  Negative for chest pain, palpitations and leg swelling.  Gastrointestinal: Negative.  Negative for heartburn, nausea and vomiting.  Musculoskeletal: Negative.  Negative for myalgias.  Neurological: Negative.  Negative for dizziness, focal weakness, seizures and headaches.  Psychiatric/Behavioral:  Negative for suicidal ideas. The patient is nervous/anxious.        SEE HPI    Past Medical History:  Diagnosis Date   Anxiety    Asthma    Panic     No past surgical history on file.  No family history on file.  Social History Reviewed with no changes to be made today.   Outpatient Medications Prior to Visit  Medication Sig Dispense Refill   benztropine (COGENTIN) 0.5 MG tablet Take 0.5 mg by mouth 2 (two) times daily as needed for tremors.     Multiple Vitamin (MULTIVITAMIN WITH MINERALS) TABS tablet Take 1 tablet by mouth daily. 30 tablet 0   benztropine (COGENTIN) 1 MG tablet Take 1 tablet (1 mg total) by mouth 2 (two) times daily. 60 tablet 2   haloperidol (HALDOL) 10 MG tablet Take  1 tablet (10 mg total) by mouth at bedtime. 30 tablet 2   haloperidol (HALDOL) 5 MG tablet Take 1 tablet (5 mg total) by mouth 2 (two) times daily. 60 tablet 2   hydrOXYzine (ATARAX) 50 MG tablet Take 1 tablet (50 mg total) by mouth 3 (three) times daily as needed for anxiety. 90 tablet 2   traZODone (DESYREL) 100 MG tablet Take 1 tablet (100 mg total) by mouth at bedtime as needed for sleep. 30 tablet 2   No facility-administered medications prior to visit.    No Known Allergies     Objective:    BP 94/61   Pulse 71   Temp 98.1 F (36.7 C) (Oral)   Wt 156 lb (70.8 kg)    SpO2 98%   BMI 25.18 kg/m  Wt Readings from Last 3 Encounters:  05/28/22 156 lb (70.8 kg)  02/24/22 153 lb (69.4 kg)  01/15/22 153 lb (69.4 kg)    Physical Exam Vitals and nursing note reviewed.  Constitutional:      Appearance: He is well-developed.  HENT:     Head: Normocephalic and atraumatic.  Cardiovascular:     Rate and Rhythm: Normal rate and regular rhythm.     Heart sounds: Normal heart sounds. No murmur heard.    No friction rub. No gallop.  Pulmonary:     Effort: Pulmonary effort is normal. No tachypnea or respiratory distress.     Breath sounds: Normal breath sounds. No decreased breath sounds, wheezing, rhonchi or rales.  Chest:     Chest wall: No tenderness.  Abdominal:     General: Bowel sounds are normal.     Palpations: Abdomen is soft.  Musculoskeletal:        General: Normal range of motion.     Cervical back: Normal range of motion.  Skin:    General: Skin is warm and dry.  Neurological:     Mental Status: He is alert and oriented to person, place, and time.     Coordination: Coordination normal.  Psychiatric:        Mood and Affect: Affect is flat.        Speech: Speech normal.        Behavior: Behavior is cooperative.        Thought Content: Thought content does not include homicidal or suicidal ideation. Thought content does not include homicidal or suicidal plan.          Patient has been counseled extensively about nutrition and exercise as well as the importance of adherence with medications and regular follow-up. The patient was given clear instructions to go to ER or return to medical center if symptoms don't improve, worsen or new problems develop. The patient verbalized understanding.   Follow-up: Return if symptoms worsen or fail to improve.   Claiborne Rigg, FNP-BC [facility] [place], [place] [phone]   05/30/2022, 3:22 PM

## 2022-05-29 LAB — CBC WITH DIFFERENTIAL/PLATELET
Basophils Absolute: 0.1 10*3/uL (ref 0.0–0.2)
Basos: 1 %
EOS (ABSOLUTE): 0.4 10*3/uL (ref 0.0–0.4)
Eos: 3 %
Hematocrit: 44.1 % (ref 37.5–51.0)
Hemoglobin: 15 g/dL (ref 13.0–17.7)
Immature Grans (Abs): 0 10*3/uL (ref 0.0–0.1)
Immature Granulocytes: 0 %
Lymphocytes Absolute: 3.5 10*3/uL — ABNORMAL HIGH (ref 0.7–3.1)
Lymphs: 30 %
MCH: 32.8 pg (ref 26.6–33.0)
MCHC: 34 g/dL (ref 31.5–35.7)
MCV: 97 fL (ref 79–97)
Monocytes Absolute: 1 10*3/uL — ABNORMAL HIGH (ref 0.1–0.9)
Monocytes: 9 %
Neutrophils Absolute: 6.8 10*3/uL (ref 1.4–7.0)
Neutrophils: 57 %
Platelets: 254 10*3/uL (ref 150–450)
RBC: 4.57 x10E6/uL (ref 4.14–5.80)
RDW: 13.2 % (ref 11.6–15.4)
WBC: 11.8 10*3/uL — ABNORMAL HIGH (ref 3.4–10.8)

## 2022-05-29 LAB — CMP14+EGFR
ALT: 39 IU/L (ref 0–44)
AST: 31 IU/L (ref 0–40)
Albumin/Globulin Ratio: 1.6 (ref 1.2–2.2)
Albumin: 4.1 g/dL (ref 4.1–5.1)
Alkaline Phosphatase: 60 IU/L (ref 44–121)
BUN/Creatinine Ratio: 8 — ABNORMAL LOW (ref 9–20)
BUN: 7 mg/dL (ref 6–20)
Bilirubin Total: <0.2 mg/dL (ref 0.0–1.2)
CO2: 23 mmol/L (ref 20–29)
Calcium: 9.3 mg/dL (ref 8.7–10.2)
Chloride: 104 mmol/L (ref 96–106)
Creatinine, Ser: 0.85 mg/dL (ref 0.76–1.27)
Globulin, Total: 2.6 g/dL (ref 1.5–4.5)
Glucose: 95 mg/dL (ref 70–99)
Potassium: 3.7 mmol/L (ref 3.5–5.2)
Sodium: 142 mmol/L (ref 134–144)
Total Protein: 6.7 g/dL (ref 6.0–8.5)
eGFR: 114 mL/min/{1.73_m2} (ref >59)

## 2022-05-30 ENCOUNTER — Encounter: Payer: Self-pay | Admitting: Nurse Practitioner

## 2022-05-30 ENCOUNTER — Other Ambulatory Visit: Payer: Self-pay

## 2022-05-31 ENCOUNTER — Other Ambulatory Visit: Payer: Self-pay

## 2022-06-12 ENCOUNTER — Other Ambulatory Visit (HOSPITAL_COMMUNITY): Payer: Self-pay

## 2022-06-12 ENCOUNTER — Telehealth: Payer: Self-pay

## 2022-06-12 NOTE — Telephone Encounter (Signed)
[facility] Patient Advocate Encounter ? ?Insurance verification completed.   ? ?The patient is uninsured and will need patient assistance for medication. ? ?We can complete the application and will need to meet with the patient for signatures and income documentation. ? ?Jandiel Magallanes, CPhT ?Specialty Pharmacy Patient Advocate ?[facility] ?Phone: [phone] ?Fax:  [phone]  ?

## 2022-06-15 ENCOUNTER — Other Ambulatory Visit: Payer: Self-pay

## 2022-06-15 ENCOUNTER — Ambulatory Visit (INDEPENDENT_AMBULATORY_CARE_PROVIDER_SITE_OTHER): Payer: Self-pay | Admitting: Family

## 2022-06-15 ENCOUNTER — Encounter: Payer: Self-pay | Admitting: Family

## 2022-06-15 VITALS — BP 106/70 | HR 68 | Temp 98.1°F | Ht 66.0 in | Wt 150.0 lb

## 2022-06-15 DIAGNOSIS — B182 Chronic viral hepatitis C: Secondary | ICD-10-CM

## 2022-06-15 NOTE — Patient Instructions (Signed)
Nice to see you.  We will check your lab work and complete paperwork today.  Plan for follow up in 1 month after starting medication.  Limit acetaminophen (Tylenol) usage to no more than 2 grams (2,000 mg) per day.  Avoid alcohol.  Do not share toothbrushes or razors.  Practice safe sex to protect against transmission as well as sexually transmitted disease.    Hepatitis C Hepatitis C is a viral infection of the liver. It can lead to scarring of the liver (cirrhosis), liver failure, or liver cancer. Hepatitis C may go undetected for months or years because people with the infection may not have symptoms, or they may have only mild symptoms. What are the causes? This condition is caused by the hepatitis C virus (HCV). The virus can spread from person to person (is contagious) through: Blood. Childbirth. A woman who has hepatitis C can pass it to her baby during birth. Bodily fluids, such as breast milk, tears, semen, vaginal fluids, and saliva. Blood transfusions or organ transplants done in the [place] before 1992.  What increases the risk? The following factors may make you more likely to develop this condition: Having contact with unclean (contaminated) needles or syringes. This may result from: Acupuncture. Tattoing. Body piercing. Injecting drugs. Having unprotected sex with someone who is infected. Needing treatment to filter your blood (kidney dialysis). Having HIV (human immunodeficiency virus) or AIDS (acquired immunodeficiency syndrome). Working in a job that involves contact with blood or bodily fluids, such as health care.  What are the signs or symptoms? Symptoms of this condition include: Fatigue. Loss of appetite. Nausea. Vomiting. Abdominal pain. Dark yellow urine. Yellowish skin and eyes (jaundice). Itchy skin. Clay-colored bowel movements. Joint pain. Bleeding and bruising easily. Fluid building up in your stomach (ascites).  In some cases,  you may not have any symptoms. How is this diagnosed? This condition is diagnosed with: Blood tests. Other tests to check how well your liver is functioning. They may include: Magnetic resonance elastography (MRE). This imaging test uses MRIs and sound waves to measure liver stiffness. Transient elastography. This imaging test uses ultrasounds to measure liver stiffness. Liver biopsy. This test requires taking a small tissue sample from your liver to examine it under a microscope.  How is this treated? Your health care provider may perform noninvasive tests or a liver biopsy to help decide the best course of treatment. Treatment may include: Antiviral medicines and other medicines. Follow-up treatments every 6-12 months for infections or other liver conditions. Receiving a donated liver (liver transplant).  Follow these instructions at home: Medicines Take over-the-counter and prescription medicines only as told by your health care provider. Take your antiviral medicine as told by your health care provider. Do not stop taking the antiviral even if you start to feel better. Do not take any medicines unless approved by your health care provider, including over-the-counter medicines and birth control pills. Activity Rest as needed. Do not have sex unless approved by your health care provider. Ask your health care provider when you may return to school or work. Eating and drinking Eat a balanced diet with plenty of fruits and vegetables, whole grains, and lowfat (lean) meats or non-meat proteins (such as beans or tofu). Drink enough fluids to keep your urine clear or pale yellow. Do not drink alcohol. General instructions Do not share toothbrushes, nail clippers, or razors. Wash your hands frequently with soap and water. If soap and water are not available, use hand sanitizer. Cover any  cuts or open sores on your skin to prevent spreading the virus. Keep all follow-up visits as told by  your health care provider. This is important. You may need follow-up visits every 6-12 months. How is this prevented? There is no vaccine for hepatitis C. The only way to prevent the disease is to reduce the risk of exposure to the virus. Make sure you: Wash your hands frequently with soap and water. If soap and water are not available, use hand sanitizer. Do not share needles or syringes. Practice safe sex and use condoms. Avoid handling blood or bodily fluids without gloves or other protection. Avoid getting tattoos or piercings in shops or other locations that are not clean.  Contact a health care provider if: You have a fever. You develop abdominal pain. You pass dark urine. You pass clay-colored stools. You develop joint pain. Get help right away if: You have increasing fatigue or weakness. You lose your appetite. You cannot eat or drink without vomiting. You develop jaundice or your jaundice gets worse. You bruise or bleed easily. Summary Hepatitis C is a viral infection of the liver. It can lead to scarring of the liver (cirrhosis), liver failure, or liver cancer. The hepatitis C virus (HCV) causes this condition. The virus can pass from person to person (is contagious). You should not take any medicines unless approved by your health care provider. This includes over-the-counter medicines and birth control pills. This information is not intended to replace advice given to you by your health care provider. Make sure you discuss any questions you have with your health care provider. Document Released: 10/26/2000 Document Revised: 12/04/2016 Document Reviewed: 12/04/2016 Elsevier Interactive Patient Education  [employer].

## 2022-06-15 NOTE — Progress Notes (Signed)
Subjective:    Patient ID: Jeffrey Holland, male    DOB: [birth year], 36 y.o.   MRN: [id number]  Chief Complaint  Patient presents with   Hepatitis C    HPI:  Jeffrey Holland is a 36 y.o. male with previous medical history of schizophrenia spectrum disorder and polysubstance use presents today for initial Hepatitis C visit.   Jeffrey Holland tested positive for Hepatitis C on 11/15/21 with RNA level of 353,000 during a Behavioral Health Hospitalization in January 2023.  Hepatitis B Surface Antigen and HIV testing was also negative. Risk factor for Hepatitis C is drug use. Recent lab work completed with AST 31 and ALT 39; and platelets 254. Has not been treated since initial diagnosis. No personal or family history of liver disease. Does have frequent nausea and vomiting with occasional lower abdominal pain and denies fatigue, scleral icterus or jaundice. No current recreational or illicit drug use or alcohol consumption. Smokes about 1 pack of tobacco daily. Has been sober from IV drug use. Will need [place], [facility], and medication assistance.    No Known Allergies    Outpatient Medications Prior to Visit  Medication Sig Dispense Refill   benztropine (COGENTIN) 0.5 MG tablet Take 0.5 mg by mouth 2 (two) times daily as needed for tremors.     benztropine (COGENTIN) 1 MG tablet Take 1 tablet (1 mg total) by mouth 2 (two) times daily. 120 tablet 0   haloperidol (HALDOL) 10 MG tablet Take 1 tablet (10 mg total) by mouth at bedtime. 60 tablet 0   haloperidol (HALDOL) 5 MG tablet Take 1 tablet (5 mg total) by mouth 2 (two) times daily. 120 tablet 0   hydrOXYzine (ATARAX) 50 MG tablet Take 1 tablet (50 mg total) by mouth 3 (three) times daily as needed for anxiety. 90 tablet 0   Multiple Vitamin (MULTIVITAMIN WITH MINERALS) TABS tablet Take 1 tablet by mouth daily. 30 tablet 0   traZODone (DESYREL) 100 MG tablet Take 1 tablet (100 mg total) by mouth at bedtime as needed for sleep. 30 tablet  2   No facility-administered medications prior to visit.     Past Medical History:  Diagnosis Date   Anxiety    Asthma    Hepatitis C    Panic       Past Surgical History:  Procedure Laterality Date   HAND SURGERY Right 2000      History reviewed. No pertinent family history.    Social History   Socioeconomic History   Marital status: Single    Spouse name: Not on file   Number of children: Not on file   Years of education: Not on file   Highest education level: Not on file  Occupational History   Not on file  Tobacco Use   Smoking status: Every Day    Packs/day: 1.00    Types: Cigarettes   Smokeless tobacco: Not on file  Vaping Use   Vaping Use: Every day  Substance and Sexual Activity   Alcohol use: Not Currently   Drug use: Not Currently    Types: Marijuana, Methamphetamines, Cocaine   Sexual activity: Yes    Birth control/protection: None  Other Topics Concern   Not on file  Social History Narrative   ** Merged History Encounter **       Social Determinants of Health   Financial Resource Strain: Not on file  Food Insecurity: Not on file  Transportation Needs: Not on file  Physical Activity:  Not on file  Stress: Not on file  Social Connections: Not on file  Intimate Partner Violence: Not on file      Review of Systems  Constitutional:  Negative for chills, diaphoresis, fatigue and fever.  Respiratory:  Negative for cough, chest tightness, shortness of breath and wheezing.   Cardiovascular:  Negative for chest pain.  Gastrointestinal:  Negative for abdominal distention, abdominal pain, constipation, diarrhea, nausea and vomiting.  Neurological:  Negative for weakness and headaches.  Hematological:  Does not bruise/bleed easily.       Objective:    BP 106/70   Pulse 68   Temp 98.1 F (36.7 C) (Oral)   Ht 5\' 6"  (1.676 m)   Wt 150 lb (68 kg)   BMI 24.21 kg/m  Nursing note and vital signs reviewed.  Physical  Exam Constitutional:      General: He is not in acute distress.    Appearance: He is well-developed.  Cardiovascular:     Rate and Rhythm: Normal rate and regular rhythm.     Heart sounds: Normal heart sounds. No murmur heard.    No friction rub. No gallop.  Pulmonary:     Effort: Pulmonary effort is normal. No respiratory distress.     Breath sounds: Normal breath sounds. No wheezing or rales.  Chest:     Chest wall: No tenderness.  Abdominal:     General: Bowel sounds are normal. There is no distension.     Palpations: Abdomen is soft. There is no mass.     Tenderness: There is no abdominal tenderness. There is no guarding or rebound.  Skin:    General: Skin is warm and dry.  Neurological:     Mental Status: He is alert and oriented to person, place, and time.  Psychiatric:        Behavior: Behavior normal.        Thought Content: Thought content normal.        Judgment: Judgment normal.         Assessment & Plan:   Patient Active Problem List   Diagnosis Date Noted   Hepatitis C 11/20/2021   Schizophrenia spectrum disorder with psychotic disorder type not yet determined (HCC) 11/15/2021   Alcohol abuse 11/15/2021   Cannabis abuse 11/15/2021   Cocaine abuse, episodic use (HCC) 11/15/2021   Psychosis (HCC)    Lumbar myelopathy (HCC) 07/15/2016     Problem List Items Addressed This Visit       Digestive   Hepatitis C - Primary    Jeffrey Holland is a 36 y/o caucasian male with chronic Hepatitis C with risk factor of IV drug use. Currently with waxing and waning abdominal pain and nausea that does not appear related to Hepatitis C and is treatment naive. Reviewed the basics of Hepatitis C including transmissions, risks if left untreated, lab work, treatment options, financial assistance and plan of care. Previous lab work negative for HIV and Hepatitis B. Check Hepatitis C lab work. Applications completed for medication and laboratory assistance. Plan for treatment with 8  weeks of Mavyret pending lab work results with follow up 1 month after start of treatment.       Relevant Orders   Hepatic function panel   Hepatitis C genotype   Hepatitis C RNA quantitative   Liver Fibrosis, FibroTest-ActiTest   Protime-INR     I am having 20 maintain his multivitamin with minerals, benztropine, haloperidol, haloperidol, traZODone, benztropine, and hydrOXYzine.   Follow-up: 1 month  after starting treatment or sooner if needed.     Marcos Eke, MSN, FNP-C Nurse Practitioner [facility] Main number: [phone]

## 2022-06-15 NOTE — Assessment & Plan Note (Addendum)
Mr. Jeffrey Holland is a 36 y/o caucasian male with chronic Hepatitis C with risk factor of IV drug use. Currently with waxing and waning abdominal pain and nausea that does not appear related to Hepatitis C and is treatment naive. Reviewed the basics of Hepatitis C including transmissions, risks if left untreated, lab work, treatment options, financial assistance and plan of care. Previous lab work negative for HIV and Hepatitis B. Check Hepatitis C lab work. Applications completed for medication and laboratory assistance. Plan for treatment with 8 weeks of Mavyret pending lab work results with follow up 1 month after start of treatment.

## 2022-06-20 LAB — LIVER FIBROSIS, FIBROTEST-ACTITEST
ALT: 40 U/L (ref 9–46)
Alpha-2-Macroglobulin: 300 mg/dL — ABNORMAL HIGH (ref 106–279)
Apolipoprotein A1: 121 mg/dL (ref 94–176)
Bilirubin: 0.8 mg/dL (ref 0.2–1.2)
Fibrosis Score: 0.42
GGT: 25 U/L (ref 3–90)
Haptoglobin: 155 mg/dL (ref 43–212)
Necroinflammat ACT Score: 0.25
Reference ID: 4490328

## 2022-06-20 LAB — HEPATITIS C RNA QUANTITATIVE
HCV Quantitative Log: 6.41 log IU/mL — ABNORMAL HIGH
HCV RNA, PCR, QN: 2590000 IU/mL — ABNORMAL HIGH

## 2022-06-20 LAB — HEPATITIS C GENOTYPE: HCV Genotype: 3

## 2022-06-20 LAB — HEPATIC FUNCTION PANEL
AG Ratio: 1.4 (calc) (ref 1.0–2.5)
ALT: 43 U/L (ref 9–46)
AST: 32 U/L (ref 10–40)
Albumin: 4.7 g/dL (ref 3.6–5.1)
Alkaline phosphatase (APISO): 62 U/L (ref 36–130)
Bilirubin, Direct: 0.1 mg/dL (ref 0.0–0.2)
Globulin: 3.3 g/dL (calc) (ref 1.9–3.7)
Indirect Bilirubin: 0.6 mg/dL (calc) (ref 0.2–1.2)
Total Bilirubin: 0.7 mg/dL (ref 0.2–1.2)
Total Protein: 8 g/dL (ref 6.1–8.1)

## 2022-06-20 LAB — PROTIME-INR
INR: 1
Prothrombin Time: 11 s (ref 9.0–11.5)

## 2022-08-13 ENCOUNTER — Telehealth: Payer: Self-pay

## 2022-08-13 NOTE — Telephone Encounter (Signed)
[facility] Patient Advocate Encounter  Completed and sent [employer] application for [employer] for this patient who is uninsured.    Patient assistance phone number for follow up is [phone].   This encounter will be updated until final determination.   Alec Jaros, CPhT [facility] Phone: [phone] Fax:  [phone]  

## 2022-12-24 ENCOUNTER — Other Ambulatory Visit (HOSPITAL_COMMUNITY): Payer: Self-pay

## 2022-12-24 ENCOUNTER — Telehealth: Payer: Self-pay

## 2022-12-24 NOTE — Telephone Encounter (Signed)
[facility] Patient Advocate Encounter  I have been unsuccsessful in reaching patient to be able to get assistance for medication.  (Mavy[employer])  I spoke with [employer], I was told they spoke to the patient back in October and he was supposed to send proof of income. (Letter stating he have no income with his signature)  Patient have yet to reply.  I have tried multiple times without a response.  Ileene Patrick, [facility] Patient [facility] Phone: [phone] Fax:  [phone]

## 2022-12-24 NOTE — Telephone Encounter (Signed)
FYI - see Donna's message

## 2022-12-24 NOTE — Telephone Encounter (Signed)
Noted! Thank you

## 2023-01-06 IMAGING — CT CT HEAD W/O CM
4 series · 17 of 47 positions shown, 19 images · non-contrast
Comparison: Head CT 07/15/2016

CLINICAL DATA: Headache.  Hallucinations.

EXAM:
CT HEAD WITHOUT CONTRAST
TECHNIQUE: Contiguous axial images were obtained from the base of the skull
through the vertex without intravenous contrast.

[Series 3: head without · axial · non-contrast · 0.44mm/px · z∈[-80,+40]mm · 7 of 33 slices shown, 9 images]
[im 5/33  brain]
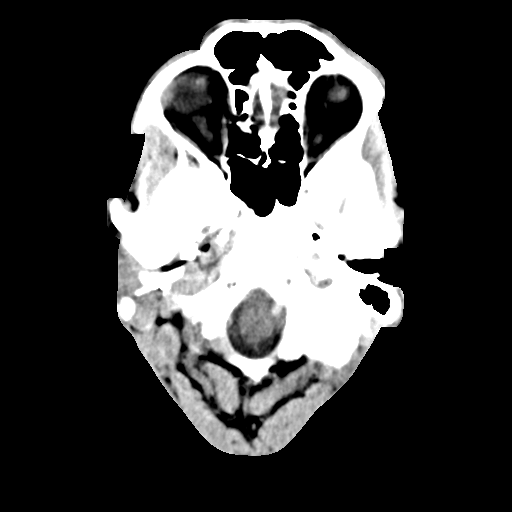
[im 5/33  bone]
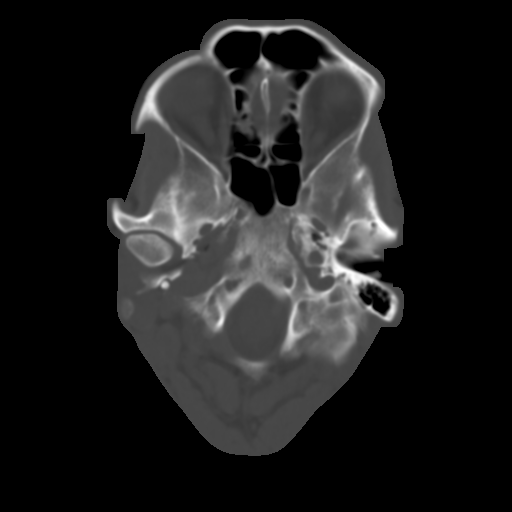
[im 9/33  brain]
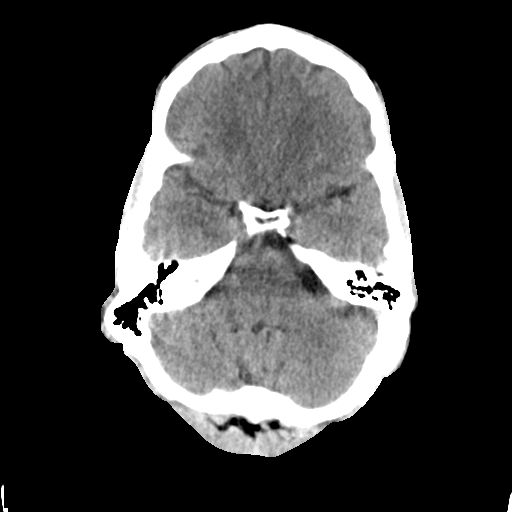
[im 13/33  brain]
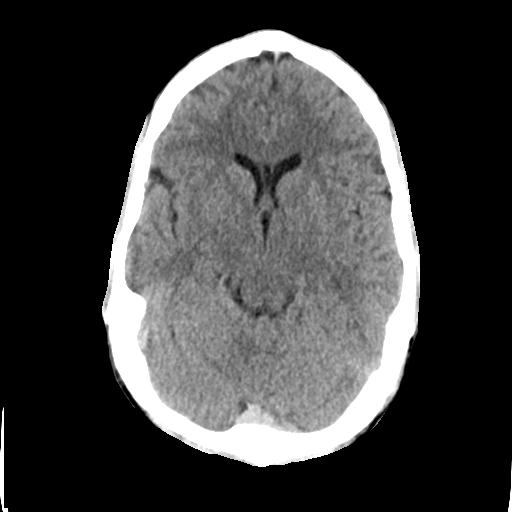
[im 17/33  brain]
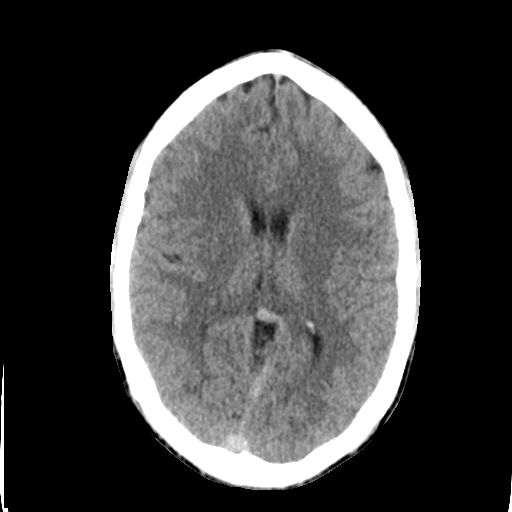
[im 21/33  brain]
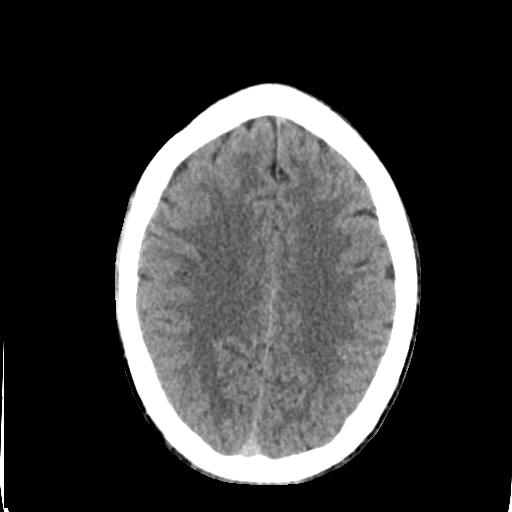
[im 21/33  bone]
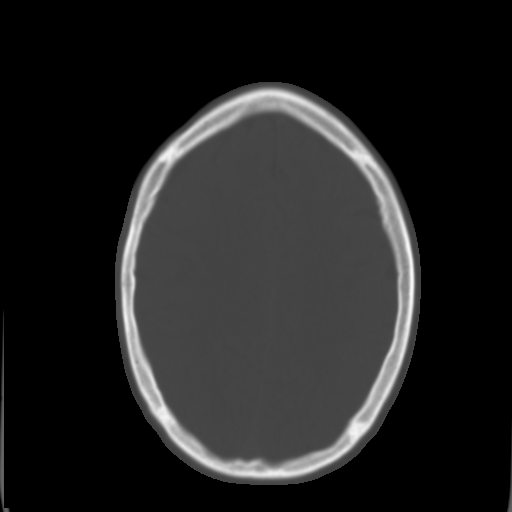
[im 25/33  brain]
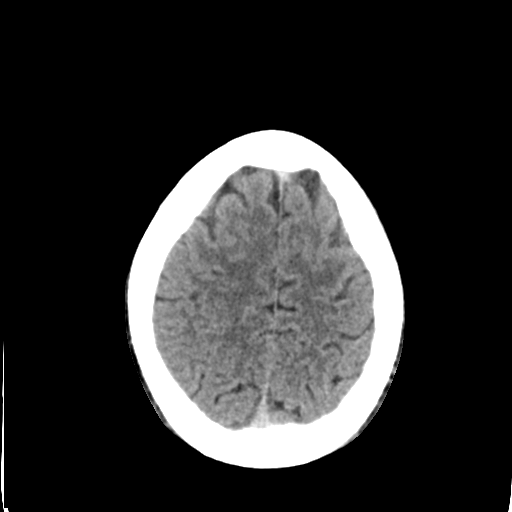
[im 29/33  brain]
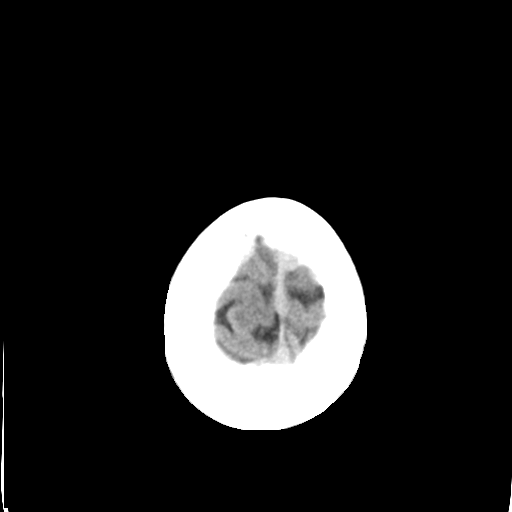

[Series 4: head bone · axial · 0.44mm/px · z∈[-84,-28]mm · 4 of 82 slices shown]
[im 9/82  bone]
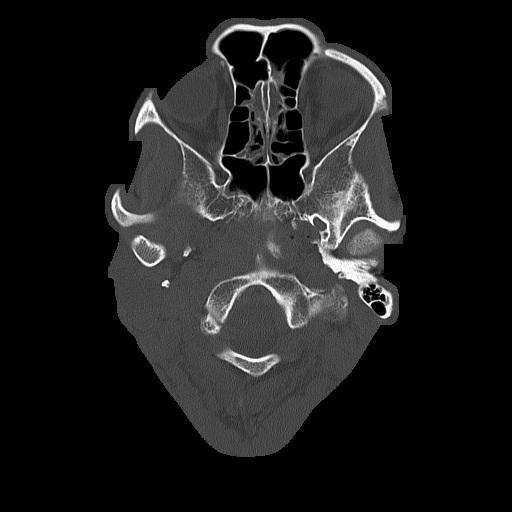
[im 17/82  bone]
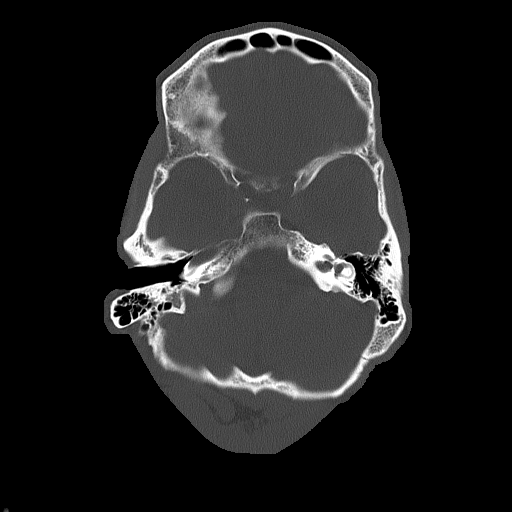
[im 25/82  bone]
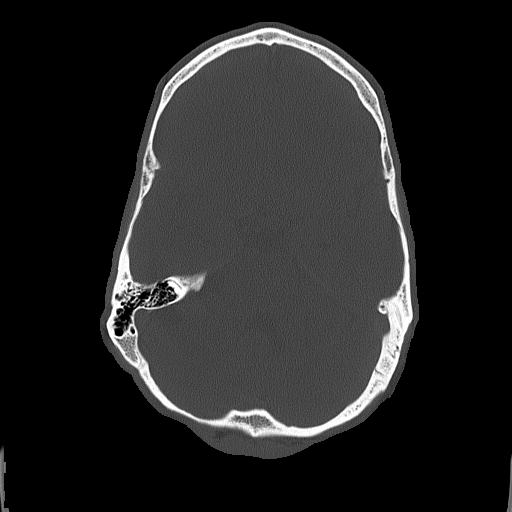
[im 37/82  bone]
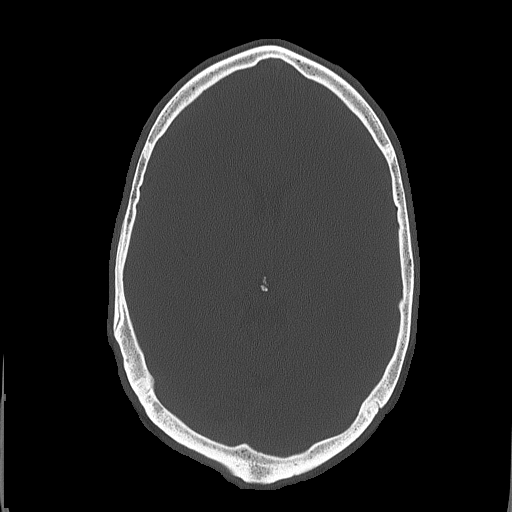

[Series 5: head without cor · coronal · non-contrast · 0.35mm/px · 3 of 72 slices shown]
[im 24/72  brain]
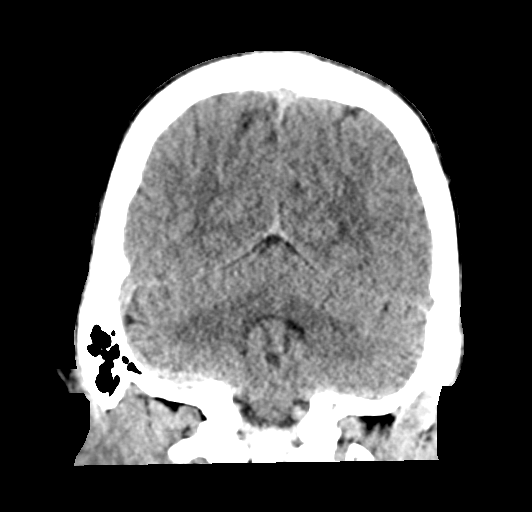
[im 32/72  brain]
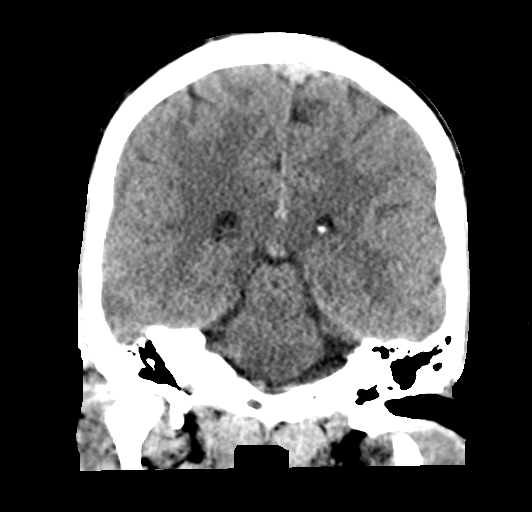
[im 40/72  brain]
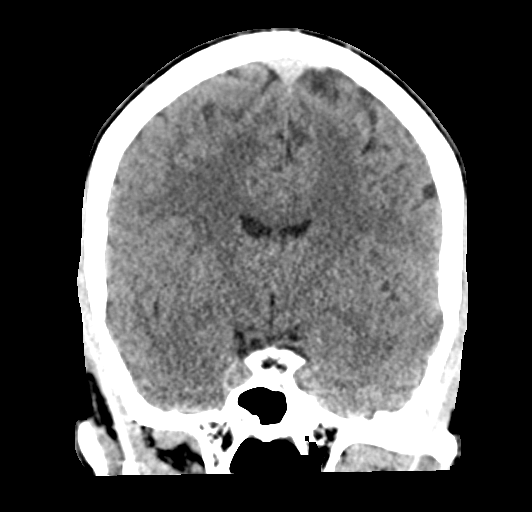

[Series 6: head without sag · sagittal · non-contrast · 0.34mm/px · 3 of 65 slices shown]
[im 22/65  brain]
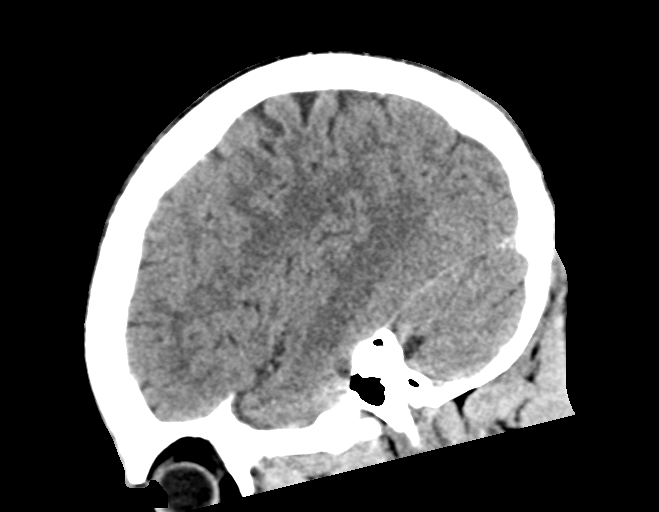
[im 33/65  brain]
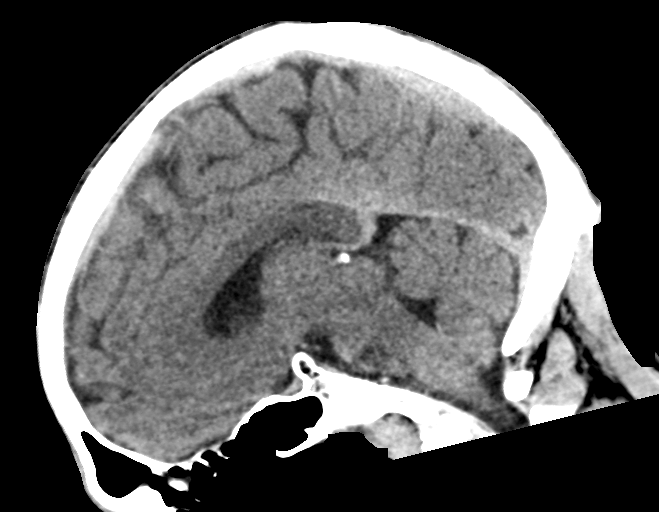
[im 43/65  brain]
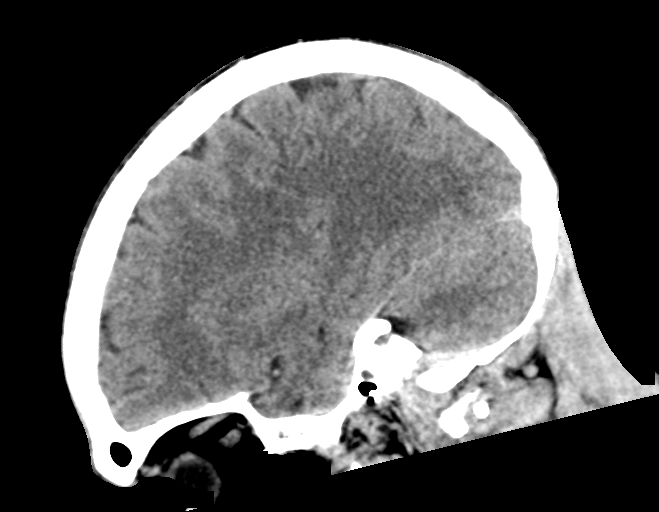

[17 of 47 positions shown; findings below may reference images not displayed]

FINDINGS: Brain: There is no evidence of an acute infarct, intracranial
hemorrhage, mass, midline shift, or extra-axial fluid collection.
The ventricles and sulci are normal.

Vascular: No hyperdense vessel.

Skull: No acute fracture or suspicious osseous lesion.

Sinuses/Orbits: Visualized paranasal sinuses and mastoid air cells
are clear. Visualized orbits are unremarkable.

Other: None.
IMPRESSION: Negative head CT.

## 2023-04-19 ENCOUNTER — Other Ambulatory Visit (HOSPITAL_COMMUNITY): Payer: Self-pay

## 2023-07-19 NOTE — Progress Notes (Signed)
This encounter was created in error - please disregard.

## 2024-02-12 ENCOUNTER — Other Ambulatory Visit: Payer: Self-pay
